# Patient Record
Sex: Male | Born: 1935
Health system: Southern US, Community
[De-identification: ages and names within clinical notes are randomized; demographics above are authoritative.]

## PROBLEM LIST (undated history)

## (undated) DIAGNOSIS — G709 Myoneural disorder, unspecified: Secondary | ICD-10-CM

## (undated) DIAGNOSIS — K449 Diaphragmatic hernia without obstruction or gangrene: Secondary | ICD-10-CM

## (undated) DIAGNOSIS — H3530 Unspecified macular degeneration: Secondary | ICD-10-CM

## (undated) DIAGNOSIS — D509 Iron deficiency anemia, unspecified: Secondary | ICD-10-CM

## (undated) DIAGNOSIS — D2362 Other benign neoplasm of skin of left upper limb, including shoulder: Secondary | ICD-10-CM

## (undated) DIAGNOSIS — M5030 Other cervical disc degeneration, unspecified cervical region: Secondary | ICD-10-CM

## (undated) DIAGNOSIS — T7840XA Allergy, unspecified, initial encounter: Secondary | ICD-10-CM

## (undated) DIAGNOSIS — H269 Unspecified cataract: Secondary | ICD-10-CM

## (undated) DIAGNOSIS — K219 Gastro-esophageal reflux disease without esophagitis: Secondary | ICD-10-CM

## (undated) DIAGNOSIS — C801 Malignant (primary) neoplasm, unspecified: Secondary | ICD-10-CM

## (undated) DIAGNOSIS — J189 Pneumonia, unspecified organism: Secondary | ICD-10-CM

## (undated) DIAGNOSIS — M4800 Spinal stenosis, site unspecified: Secondary | ICD-10-CM

## (undated) DIAGNOSIS — M109 Gout, unspecified: Secondary | ICD-10-CM

## (undated) DIAGNOSIS — A0472 Enterocolitis due to Clostridium difficile, not specified as recurrent: Secondary | ICD-10-CM

## (undated) DIAGNOSIS — I10 Essential (primary) hypertension: Secondary | ICD-10-CM

## (undated) DIAGNOSIS — J45909 Unspecified asthma, uncomplicated: Secondary | ICD-10-CM

## (undated) DIAGNOSIS — R972 Elevated prostate specific antigen [PSA]: Secondary | ICD-10-CM

## (undated) HISTORY — PX: BACK SURGERY: SHX140

## (undated) HISTORY — PX: SKIN CANCER EXCISION: SHX779

## (undated) HISTORY — DX: Diaphragmatic hernia without obstruction or gangrene: K44.9

## (undated) HISTORY — PX: POLYPECTOMY: SHX149

## (undated) HISTORY — DX: Unspecified macular degeneration: H35.30

## (undated) HISTORY — DX: Gout, unspecified: M10.9

## (undated) HISTORY — DX: Unspecified cataract: H26.9

## (undated) HISTORY — DX: Other cervical disc degeneration, unspecified cervical region: M50.30

## (undated) HISTORY — DX: Essential (primary) hypertension: I10

## (undated) HISTORY — DX: Gastro-esophageal reflux disease without esophagitis: K21.9

## (undated) HISTORY — DX: Spinal stenosis, site unspecified: M48.00

## (undated) HISTORY — DX: Iron deficiency anemia, unspecified: D50.9

## (undated) HISTORY — DX: Enterocolitis due to Clostridium difficile, not specified as recurrent: A04.72

## (undated) HISTORY — PX: COLONOSCOPY W/ POLYPECTOMY: SHX1380

## (undated) HISTORY — PX: MOUTH SURGERY: SHX715

## (undated) HISTORY — DX: Allergy, unspecified, initial encounter: T78.40XA

## (undated) HISTORY — DX: Myoneural disorder, unspecified: G70.9

## (undated) HISTORY — DX: Elevated prostate specific antigen (PSA): R97.20

## (undated) HISTORY — DX: Other benign neoplasm of skin of left upper limb, including shoulder: D23.62

## (undated) HISTORY — PX: OTHER SURGICAL HISTORY: SHX169

## (undated) HISTORY — PX: COLONOSCOPY: SHX174

## (undated) HISTORY — DX: Unspecified asthma, uncomplicated: J45.909

---

## 2001-08-08 ENCOUNTER — Emergency Department (HOSPITAL_COMMUNITY): Admission: EM | Admit: 2001-08-08 | Discharge: 2001-08-08 | Payer: Self-pay

## 2002-07-29 ENCOUNTER — Ambulatory Visit (HOSPITAL_COMMUNITY): Admission: RE | Admit: 2002-07-29 | Discharge: 2002-07-29 | Payer: Self-pay | Admitting: Orthopedic Surgery

## 2002-07-29 ENCOUNTER — Encounter: Payer: Self-pay | Admitting: Orthopedic Surgery

## 2004-08-15 ENCOUNTER — Ambulatory Visit: Payer: Self-pay | Admitting: Family Medicine

## 2004-10-24 ENCOUNTER — Ambulatory Visit: Payer: Self-pay | Admitting: Internal Medicine

## 2004-10-24 ENCOUNTER — Ambulatory Visit: Payer: Self-pay | Admitting: Family Medicine

## 2005-10-01 ENCOUNTER — Ambulatory Visit: Payer: Self-pay | Admitting: Family Medicine

## 2006-08-06 ENCOUNTER — Ambulatory Visit: Payer: Self-pay | Admitting: Family Medicine

## 2006-08-13 DIAGNOSIS — F17200 Nicotine dependence, unspecified, uncomplicated: Secondary | ICD-10-CM | POA: Insufficient documentation

## 2006-08-13 DIAGNOSIS — I10 Essential (primary) hypertension: Secondary | ICD-10-CM | POA: Insufficient documentation

## 2006-08-13 DIAGNOSIS — M1990 Unspecified osteoarthritis, unspecified site: Secondary | ICD-10-CM | POA: Insufficient documentation

## 2006-08-13 DIAGNOSIS — D509 Iron deficiency anemia, unspecified: Secondary | ICD-10-CM | POA: Insufficient documentation

## 2006-08-13 DIAGNOSIS — K219 Gastro-esophageal reflux disease without esophagitis: Secondary | ICD-10-CM | POA: Insufficient documentation

## 2006-08-13 DIAGNOSIS — E669 Obesity, unspecified: Secondary | ICD-10-CM | POA: Insufficient documentation

## 2006-08-13 DIAGNOSIS — J309 Allergic rhinitis, unspecified: Secondary | ICD-10-CM | POA: Insufficient documentation

## 2006-08-13 DIAGNOSIS — K449 Diaphragmatic hernia without obstruction or gangrene: Secondary | ICD-10-CM | POA: Insufficient documentation

## 2007-01-16 ENCOUNTER — Ambulatory Visit: Payer: Self-pay | Admitting: Internal Medicine

## 2007-07-31 ENCOUNTER — Ambulatory Visit: Payer: Self-pay | Admitting: Family Medicine

## 2007-08-18 DIAGNOSIS — J189 Pneumonia, unspecified organism: Secondary | ICD-10-CM | POA: Insufficient documentation

## 2007-08-20 ENCOUNTER — Ambulatory Visit: Payer: Self-pay | Admitting: Family Medicine

## 2008-01-06 ENCOUNTER — Ambulatory Visit: Payer: Self-pay | Admitting: Family Medicine

## 2008-05-16 ENCOUNTER — Ambulatory Visit: Payer: Self-pay | Admitting: Family Medicine

## 2008-05-16 LAB — CONVERTED CEMR LAB
ALT: 17 units/L (ref 0–53)
AST: 23 units/L (ref 0–37)
Albumin: 3.9 g/dL (ref 3.5–5.2)
BUN: 29 mg/dL — ABNORMAL HIGH (ref 6–23)
Basophils Relative: 2.3 % (ref 0.0–3.0)
Chloride: 109 meq/L (ref 96–112)
Creatinine, Ser: 1.6 mg/dL — ABNORMAL HIGH (ref 0.4–1.5)
Direct LDL: 102.7 mg/dL
Eosinophils Absolute: 0.4 10*3/uL (ref 0.0–0.7)
Eosinophils Relative: 5.6 % — ABNORMAL HIGH (ref 0.0–5.0)
GFR calc non Af Amer: 45 mL/min
HCT: 45.3 % (ref 39.0–52.0)
HDL: 29.5 mg/dL — ABNORMAL LOW (ref >39.0)
MCV: 86.1 fL (ref 78.0–100.0)
Neutrophils Relative %: 69 % (ref 43.0–77.0)
RBC: 5.26 M/uL (ref 4.22–5.81)
Total Protein: 7.3 g/dL (ref 6.0–8.3)
WBC: 7.2 10*3/uL (ref 4.5–10.5)

## 2008-05-24 ENCOUNTER — Ambulatory Visit: Payer: Self-pay | Admitting: Internal Medicine

## 2008-05-24 DIAGNOSIS — M4800 Spinal stenosis, site unspecified: Secondary | ICD-10-CM | POA: Insufficient documentation

## 2008-05-24 DIAGNOSIS — R972 Elevated prostate specific antigen [PSA]: Secondary | ICD-10-CM | POA: Insufficient documentation

## 2008-05-30 ENCOUNTER — Inpatient Hospital Stay (HOSPITAL_COMMUNITY): Admission: RE | Admit: 2008-05-30 | Discharge: 2008-06-01 | Payer: Self-pay | Admitting: Orthopedic Surgery

## 2008-05-31 ENCOUNTER — Ambulatory Visit: Payer: Self-pay | Admitting: Gastroenterology

## 2008-06-30 ENCOUNTER — Ambulatory Visit: Payer: Self-pay | Admitting: Family Medicine

## 2009-01-11 DIAGNOSIS — J45909 Unspecified asthma, uncomplicated: Secondary | ICD-10-CM | POA: Insufficient documentation

## 2009-01-20 ENCOUNTER — Ambulatory Visit: Payer: Self-pay | Admitting: Family Medicine

## 2009-07-14 ENCOUNTER — Ambulatory Visit: Payer: Self-pay | Admitting: Family Medicine

## 2009-07-14 DIAGNOSIS — L408 Other psoriasis: Secondary | ICD-10-CM | POA: Insufficient documentation

## 2009-07-14 DIAGNOSIS — R944 Abnormal results of kidney function studies: Secondary | ICD-10-CM | POA: Insufficient documentation

## 2009-08-16 ENCOUNTER — Ambulatory Visit: Payer: Self-pay | Admitting: Family Medicine

## 2009-08-16 LAB — CONVERTED CEMR LAB
ALT: 20 units/L (ref 0–53)
AST: 24 units/L (ref 0–37)
Alkaline Phosphatase: 21 units/L — ABNORMAL LOW (ref 39–117)
Calcium: 9.8 mg/dL (ref 8.4–10.5)
Eosinophils Relative: 8.2 % — ABNORMAL HIGH (ref 0.0–5.0)
GFR calc non Af Amer: 48.47 mL/min (ref >60)
Glucose, Urine, Semiquant: NEGATIVE
HCT: 45.6 % (ref 39.0–52.0)
Hemoglobin: 15.5 g/dL (ref 13.0–17.0)
LDL Cholesterol: 94 mg/dL (ref 0–99)
Lymphocytes Relative: 26.8 % (ref 12.0–46.0)
Lymphs Abs: 1.6 10*3/uL (ref 0.7–4.0)
Monocytes Relative: 8.7 % (ref 3.0–12.0)
Neutro Abs: 3.5 10*3/uL (ref 1.4–7.7)
Nitrite: NEGATIVE
Platelets: 147 10*3/uL — ABNORMAL LOW (ref 150.0–400.0)
Potassium: 5.1 meq/L (ref 3.5–5.1)
Protein, U semiquant: NEGATIVE
Sodium: 144 meq/L (ref 135–145)
TSH: 2.14 microintl units/mL (ref 0.35–5.50)
Total Bilirubin: 1.5 mg/dL — ABNORMAL HIGH (ref 0.3–1.2)
Urobilinogen, UA: 0.2
VLDL: 34.6 mg/dL (ref 0.0–40.0)
WBC Urine, dipstick: NEGATIVE
WBC: 6.1 10*3/uL (ref 4.5–10.5)

## 2009-08-23 ENCOUNTER — Ambulatory Visit: Payer: Self-pay | Admitting: Family Medicine

## 2010-08-17 ENCOUNTER — Ambulatory Visit: Payer: Self-pay | Admitting: Family Medicine

## 2010-08-17 LAB — CONVERTED CEMR LAB
AST: 25 units/L (ref 0–37)
Albumin: 3.9 g/dL (ref 3.5–5.2)
BUN: 39 mg/dL — ABNORMAL HIGH (ref 6–23)
Basophils Absolute: 0 10*3/uL (ref 0.0–0.1)
Basophils Relative: 0.5 % (ref 0.0–3.0)
Bilirubin Urine: NEGATIVE
Calcium: 8.9 mg/dL (ref 8.4–10.5)
Chloride: 107 meq/L (ref 96–112)
Cholesterol: 172 mg/dL (ref 0–200)
Creatinine, Ser: 1.4 mg/dL (ref 0.4–1.5)
Eosinophils Absolute: 0.5 10*3/uL (ref 0.0–0.7)
Eosinophils Relative: 7.1 % — ABNORMAL HIGH (ref 0.0–5.0)
GFR calc non Af Amer: 51.92 mL/min (ref >60)
HDL: 35.8 mg/dL — ABNORMAL LOW (ref >39.00)
Ketones, urine, test strip: NEGATIVE
LDL Cholesterol: 109 mg/dL — ABNORMAL HIGH (ref 0–99)
Lymphocytes Relative: 28.4 % (ref 12.0–46.0)
MCV: 89.4 fL (ref 78.0–100.0)
Monocytes Absolute: 0.6 10*3/uL (ref 0.1–1.0)
Monocytes Relative: 9.2 % (ref 3.0–12.0)
Neutrophils Relative %: 54.8 % (ref 43.0–77.0)
Nitrite: NEGATIVE
Platelets: 170 10*3/uL (ref 150.0–400.0)
RBC: 4.91 M/uL (ref 4.22–5.81)
TSH: 1.92 microintl units/mL (ref 0.35–5.50)
Total CHOL/HDL Ratio: 5
Triglycerides: 136 mg/dL (ref 0.0–149.0)
Urobilinogen, UA: 0.2
VLDL: 27.2 mg/dL (ref 0.0–40.0)
WBC: 6.4 10*3/uL (ref 4.5–10.5)

## 2010-08-27 ENCOUNTER — Ambulatory Visit: Payer: Self-pay | Admitting: Family Medicine

## 2010-08-27 ENCOUNTER — Encounter: Payer: Self-pay | Admitting: Family Medicine

## 2010-10-30 NOTE — Assessment & Plan Note (Signed)
Summary: cpx/cjr   Vital Signs:  Patient profile:   77 year old male Height:      69 inches Weight:      217 pounds BMI:     32.16 Temp:     98.2 degrees F oral Pulse rate:   64 / minute Pulse rhythm:   irregular BP sitting:   102 / 62  (left arm) Cuff size:   large  Vitals Entered By: Townsend Roger, CMA (August 27, 2010 2:17 PM) CC: cpx   CC:  cpx.  History of Present Illness: Shane [name] is a 77 year old, married male, nonsmoker, who comes in today for Medicare wellness exam.  He has a history of underlying hypertension, for which he takes one half of a Tenoretic daily.  BP 102/62.  Asymptomatic.  No hypotension also Avodart .5 nightly for mild BPH.  He gets routine eye care, dental care, colonoscopy, 2006 normal, tetanus, 2005, Pneumovax 2010, has not had the flu shot.  Recommend he get his flu shot.  Review of systems pertinent as having sleep dysfunction.  Because of pain in his right and left hips.  This is been normal on for about 6 months.  It seems to be getting worse.  Once he gets up and moves around.  It feels better.  He plays golf two to 3 times a week.  No history of trauma.. Here for Medicare AWV:  1.   Risk factors based on Past M, S, F history:negative, except for hip pain as noted aboveq 2.   Physical Activities: continues to be physically active 3.   Depression/mood: good mood.  No depression 4.   Hearing: normal 5.   ADL's: functions independently 6.   Fall Risk: we did not identify 7.   Home Safety: no guns in the house 8.   Height, weight, &visual acuity:height weight, normal.  Vision normal except for slight macular degeneration, followed by his ophthalmologist, Dr. Gershon Crane 9.   Counseling: continue good health habits 10.   Labs ordered based on risk factors: done  today 11.           Referral Coordination.......none indicated 12.           Care Plan.......Marland Kitchenreviewed all medications 13.            Cognitive Assessment oriented x 3 does all his own financial  work independently  Current Medications (verified): 1)  Occuvite 2)  Asa 3)  Glucosamine 1500 Complex   Caps (Glucosamine-Chondroit-Vit C-Mn) .... Take One Tab Two Times A Day 4)  Tenoretic 50 50-25 Mg  Tabs (Atenolol-Chlorthalidone) .... 1/2 Qam 5)  Avodart 0.5 Mg Caps (Dutasteride) .... At Bedtime 6)  Motrin Ib 200 Mg Tabs (Ibuprofen) .... Once Daily As Needed  Allergies (verified): No Known Drug Allergies  Past History:  Past medical, surgical, family and social histories (including risk factors) reviewed, and no changes noted (except as noted below).  Past Medical History: Reviewed history from 08/13/2006 and no changes required. Allergic rhinitis Anemia-iron deficiency GERD Hypertension Degenerative joint disease Hiatal hernia Obesity Tobacco abuse- quit 2001, former 1/2 ppd smoker  Family History: Reviewed history and no changes required.  Social History: Reviewed history from 05/16/2008 and no changes required. Retired Never Smoked Alcohol use-no Drug use-no Regular exercise-yes  Review of Systems      See HPI  Physical Exam  General:  Well-developed,well-nourished,in no acute distress; alert,appropriate and cooperative throughout examination Head:  Normocephalic and atraumatic without obvious abnormalities. No apparent alopecia or balding. Eyes:  No corneal or conjunctival inflammation noted. EOMI. Perrla. Funduscopic exam benign, without hemorrhages, exudates or papilledema. Vision grossly normal. Ears:  External ear exam shows no significant lesions or deformities.  Otoscopic examination reveals clear canals, tympanic membranes are intact bilaterally without bulging, retraction, inflammation or discharge. Hearing is grossly normal bilaterally. Nose:  External nasal examination shows no deformity or inflammation. Nasal mucosa are pink and moist without lesions or exudates. Mouth:  Oral mucosa and oropharynx without lesions or exudates.  Teeth in good  repair. Neck:  No deformities, masses, or tenderness noted. Chest Wall:  No deformities, masses, tenderness or gynecomastia noted. Breasts:  No masses or gynecomastia noted Lungs:  Normal respiratory effort, chest expands symmetrically. Lungs are clear to auscultation, no crackles or wheezes. Heart:  Normal rate and regular rhythm. S1 and S2 normal without gallop, murmur, click, rub or other extra sounds. Abdomen:  Bowel sounds positive,abdomen soft and non-tender without masses, organomegaly or hernias noted. Rectal:  No external abnormalities noted. Normal sphincter tone. No rectal masses or tenderness. Genitalia:  Testes bilaterally descended without nodularity, tenderness or masses. No scrotal masses or lesions. No penis lesions or urethral discharge. Prostate:  no nodules, no asymmetry, and 1+ enlarged.   Msk:  all his joints appeared normal except for right and left hip.  Marked decrease range of motion 30 degrees and external rotation of both hips Pulses:  R and L carotid,radial,femoral,dorsalis pedis and posterior tibial pulses are full and equal bilaterally Extremities:  No clubbing, cyanosis, edema, or deformity noted with normal full range of motion of all joints.   Neurologic:  No cranial nerve deficits noted. Station and gait are normal. Plantar reflexes are down-going bilaterally. DTRs are symmetrical throughout. Sensory, motor and coordinative functions appear intact.   Impression & Recommendations:  Problem # 1:  PROSTATE SPECIFIC ANTIGEN, ELEVATED (ICD-790.93) Assessment Improved  Orders: Prescription Created Electronically [phone]) Medicare -1st Annual Wellness Visit [phone])  Problem # 2:  DEGENERATIVE JOINT DISEASE (ICD-715.90) Assessment: Deteriorated  His updated medication list for this problem includes:    Motrin Ib 200 Mg Tabs (Ibuprofen) ..... Once daily as needed  Orders: Prescription Created Electronically [phone]) Medicare -1st Annual Wellness Visit  [phone])  Problem # 3:  HYPERTENSION (ICD-401.9) Assessment: Improved  His updated medication list for this problem includes:    Tenoretic 50 50-25 Mg Tabs (Atenolol-chlorthalidone) .Marland Kitchen... 1/2 qam  Orders: Prescription Created Electronically [phone]) Medicare -1st Annual Wellness Visit [phone])  Problem # 4:  Preventive Health Care (ICD-V70.0) Assessment: Unchanged  Complete Medication List: 1)  Occuvite  2)  Asa  3)  Glucosamine 1500 Complex Caps (Glucosamine-chondroit-vit c-mn) .... Take one tab two times a day 4)  Tenoretic 50 50-25 Mg Tabs (Atenolol-chlorthalidone) .... 1/2 qam 5)  Avodart 0.5 Mg Caps (Dutasteride) .... At bedtime 6)  Motrin Ib 200 Mg Tabs (Ibuprofen) .... Once daily as needed  Patient Instructions: 1)  takes 600 mg of Motrin twice daily with food, and call Dr. Joni Fears, orthopedist, for consult. 2)  Please schedule a follow-up appointment in 1 year. Prescriptions: AVODART 0.5 MG CAPS (DUTASTERIDE) at bedtime  #100 x 3   Entered and Authorized by:   Dorena Cookey MD   Signed by:   Dorena Cookey MD on 08/27/2010   Method used:   Electronically to        [facility] [id number]* (retail)       [address] [facility]  [place], [place]  [address]       Ph: [phone]       Fax: [phone]   RxID:   [id number] MG  TABS (ATENOLOL-CHLORTHALIDONE) 1/2 qam  #50 x 3   Entered and Authorized by:   Dorena Cookey MD   Signed by:   Dorena Cookey MD on 08/27/2010   Method used:   Electronically to        [facility] [phone]* (retail)       [address]       [place], [place]  [address]       Ph: [phone]       Fax: [phone]   RxID:   [id number]    Orders Added: 1)  Prescription Created Electronically [phone] 2)  Medicare -1st Annual Wellness Visit [id number]

## 2011-01-15 ENCOUNTER — Encounter: Payer: Self-pay | Admitting: Family Medicine

## 2011-01-15 ENCOUNTER — Emergency Department (HOSPITAL_COMMUNITY)
Admission: EM | Admit: 2011-01-15 | Discharge: 2011-01-16 | Disposition: A | Discharge status: Home / Self Care | Payer: Medicare Other | Attending: Emergency Medicine | Admitting: Emergency Medicine

## 2011-01-15 ENCOUNTER — Ambulatory Visit (INDEPENDENT_AMBULATORY_CARE_PROVIDER_SITE_OTHER): Payer: Medicare Other | Admitting: Family Medicine

## 2011-01-15 VITALS — BP 120/80 | Temp 98.7°F | Ht 69.25 in | Wt 202.0 lb

## 2011-01-15 DIAGNOSIS — L03211 Cellulitis of face: Secondary | ICD-10-CM

## 2011-01-15 DIAGNOSIS — R51 Headache: Secondary | ICD-10-CM | POA: Insufficient documentation

## 2011-01-15 DIAGNOSIS — R21 Rash and other nonspecific skin eruption: Secondary | ICD-10-CM | POA: Insufficient documentation

## 2011-01-15 DIAGNOSIS — B029 Zoster without complications: Secondary | ICD-10-CM | POA: Insufficient documentation

## 2011-01-15 DIAGNOSIS — L0201 Cutaneous abscess of face: Secondary | ICD-10-CM

## 2011-01-15 DIAGNOSIS — R209 Unspecified disturbances of skin sensation: Secondary | ICD-10-CM | POA: Insufficient documentation

## 2011-01-15 DIAGNOSIS — Z7982 Long term (current) use of aspirin: Secondary | ICD-10-CM | POA: Insufficient documentation

## 2011-01-15 DIAGNOSIS — Z79899 Other long term (current) drug therapy: Secondary | ICD-10-CM | POA: Insufficient documentation

## 2011-01-15 DIAGNOSIS — I10 Essential (primary) hypertension: Secondary | ICD-10-CM | POA: Insufficient documentation

## 2011-01-15 MED ORDER — SULFAMETHOXAZOLE-TRIMETHOPRIM 800-160 MG PO TABS: ORAL_TABLET | ORAL | Status: AC

## 2011-01-15 MED ORDER — DOXYCYCLINE HYCLATE 100 MG PO TABS: 100.0000 mg | ORAL_TABLET | Freq: Two times a day (BID) | ORAL | Status: AC

## 2011-01-15 MED ORDER — ACYCLOVIR 400 MG PO TABS: 400.0000 mg | ORAL_TABLET | Freq: Every day | ORAL | Status: AC

## 2011-01-15 NOTE — Progress Notes (Signed)
  Subjective:    Patient ID: KAPONO BICKNELL, male    DOB: Mar 22, 1932, 77 y.o.   MRN: [id number]  HPILane is a 77 year old male, who comes in today for evaluation of a skin rash.  He states he noticed last Friday.  Some pain around his chin and a sensation of numbness.  The next day he broke out in a rash.  Yesterday, some of the lesions started draining pus.    Review of Systems    General and dermatologic review of systems otherwise negative.  He's never had a shingles vaccine Objective:   Physical Exam Well-developed well-nourished, male in no acute distress.  Examination of the face shows small vesicles ulcerated, consistent with shingles.  One area has an eschar consistent with a superficial staph infection       Assessment & Plan:  Shingles with secondary cellulitis.  Plan antiviral medicine 800 t.i.d., doxycycline, and Septra, one of each twice daily, warm soaks q.i.d. Return in two days.  Advised if rash extends up into the face, around the eyes.  He is to come to the hospital for emergent admission

## 2011-01-15 NOTE — Patient Instructions (Signed)
Begin acyclovir 1 tablet 5 times daily.  Begin doxycycline and Septra one of each twice daily.  Warm soaks 15 minutes 4 times daily.  Wash the area and apply antibiotic ointment 4 times daily.  Return on Thursday for follow-up.  If the rash begins to extend up in your face or around her eyes.  Go  immediately to the emergency room

## 2011-01-17 ENCOUNTER — Ambulatory Visit (INDEPENDENT_AMBULATORY_CARE_PROVIDER_SITE_OTHER): Payer: Medicare Other | Admitting: Family Medicine

## 2011-01-17 ENCOUNTER — Encounter: Payer: Self-pay | Admitting: Family Medicine

## 2011-01-17 VITALS — BP 120/80 | Temp 97.9°F | Wt 215.0 lb

## 2011-01-17 DIAGNOSIS — B029 Zoster without complications: Secondary | ICD-10-CM

## 2011-01-17 MED ORDER — OXYCODONE-ACETAMINOPHEN 5-325 MG PO TABS: 1.0000 | ORAL_TABLET | ORAL | Status: DC | PRN

## 2011-01-17 NOTE — Progress Notes (Signed)
  Subjective:    Patient ID: Shane [name], male    DOB: 17-Nov-1931, 77 y.o.   MRN: [id number]  HPI Turk is a 33 r comes in today for reevaluation of zoster.  We saw him last week he developed zoster around his mouth and lips.  Also, it he had some lesions that were secondarily infected and draining pus.  We therefore, start him on Vibramycin, and Septra, one of each twice daily.  Later on that night.  He went to the emergency room because he has some numbness in the right side of his face and was concerned that the rash might be going into his eye.  In the emergency room he was evaluated.  They did not feel like the rash was going into his eye.  They estimate continue the acyclovir and gave him some oxycodone for pain.  He comes in today feeling better.  His pain now as a 3 on a scale of one to 10   Review of Systems    General and dermatologic review of systems otherwise negative Objective:   Physical Exam    Well-developed well-nourished, male, no acute distress.  Examination of the oral cavity normal.  Examination of the skin around the mouth has multiple shingles-type lesions    Assessment & Plan:  Shingles with secondary cellulitis, improving.  Continue medication.  Return p.r.n.

## 2011-01-17 NOTE — Patient Instructions (Signed)
Continued in pediatrics twice a day.  Motrin 600 mg twice daily with food.  Percocet one half to one tablet every 4 to 6 hours for severe pain.  Return p.r.n.

## 2011-01-22 ENCOUNTER — Telehealth: Payer: Self-pay | Admitting: *Deleted

## 2011-01-22 NOTE — Telephone Encounter (Signed)
Stop the antibiotics

## 2011-01-22 NOTE — Telephone Encounter (Signed)
Pt is having vomiting from Doxycycline, and wants advice.

## 2011-01-22 NOTE — Telephone Encounter (Signed)
patient  Is aware 

## 2011-01-22 NOTE — Telephone Encounter (Signed)
Rachel please call 

## 2011-01-22 NOTE — Telephone Encounter (Signed)
Patient is vomiting about 40 minutes after taking his doxycyline.  Could he try something different?

## 2011-02-12 NOTE — Op Note (Signed)
[name], Shane                ACCOUNT NO.:  [id number]   MEDICAL RECORD NO.:  [id number]          PATIENT TYPE:  INP   LOCATION:  0011                         FACILITY[facility]  [facility]   PHYSICIAN:  Tarri Glenn, M.D.  DATE OF BIRTH:  31-May-1932   DATE OF PROCEDURE:  05/30/2008  DATE OF DISCHARGE:                               OPERATIVE REPORT   PREOPERATIVE DIAGNOSIS:  Central and foraminal stenosis L2-3, L3-4, L4-  5, and L5-S1.   POSTOPERATIVE DIAGNOSIS:  Central and foraminal stenosis L2-3, L3-4, L4-  5, and L5-S1.   OPERATION:  Central and foraminal decompression, L2 to the sacrum.   SURGEON:  Tarri Glenn, M.D.   ASSISTANT:  Kipp Brood. Gladstone Lighter, M.D.   ANESTHESIA:  General.   PATHOLOGY AND JUSTIFICATION FOR PROCEDURE:  He was having bilateral leg  pain with an MRI demonstrating spinal stenosis, particularly at L4-5,  less severe at L5-S1.  On the axial projections, he had by __________  fairly significant stenosis at L2-3 and at L3-4 as well.  Consequently,  the above surgery was proposed and performed.   PROCEDURE:  Prophylactic antibiotics, satisfactory general anesthesia,  Foley catheter inserted, placed in prone position on the Brach frame.  The back was prepped with DuraPrep and draped in a sterile field, time-  out performed, Ioban utilized.  A midline incision, with the spinous  processes in the middle of the field, clamped with Kocher clamps and  lateral x-ray taken.  We had the radiologist assist [place] in the reading  because of a transitional vertebra, which was designated both by the  radiologist and by MRI as being S1.  Based on this and using a total of  three x-rays to identify precisely the cephalad and caudad borders of  our dissection, I dissected soft tissue off the lamina from L2 to the  sacrum, placed two self-retaining McCullough retractors, with double-  action rongeur removed the spinous processes with most of L2 down to the  sacrum, as well as a  generous portion of the neural arches.  Then  working with 2 and 3-mm Kerrison rongeurs and occasionally with the  double-action rongeur, we removed considerable bone, performed a partial  decompression, then brought in the microscope and completed the  decompression, making sure that all cephalad and caudad was well  decompressed, as well as the foramina were patent to hockey-stick.   Despite maintaining a blood pressure in the 100 to 120 range, he had a  good bit of bone bleeding, controlled as much as possible with bone wax,  as well as some soft tissue bleeding which we controlled.  Total  estimated blood loss was 750 mL, however.  There was no dural tear.  At  the conclusion of the case, the wound was irrigated with sterile saline  and Gelfoam soaked in thrombin was placed over the dura and then  following this with a 1/4-inch Penrose drain through the left lower  lumbar area.  Then I closed the wound under direct visualization around  the  drain with interrupted #1 Vicryl in the paralumbar muscle and fascia, 2-  0 Vicryl in subcutaneous tissue, staples in the skin.  Betadine and  Adaptic dry sterile dressing were applied.  He was gently placed on his  back and taken to the recovery room in satisfactory condition, with no  known complications, no blood replacement.           ______________________________  Tarri Glenn, M.D.     JA/MEDQ  D:  05/30/2008  T:  05/31/2008  Job:  [id number]

## 2011-02-12 NOTE — H&P (Signed)
NAME:  YANIV, EPPERT                ACCOUNT NO.:  [id number]   MEDICAL RECORD NO.:  [id number]         PATIENT TYPE:  INP   LOCATION:                               FACILITY:  [facility]   PHYSICIAN:  Tarri Glenn, M.D.  DATE OF BIRTH:  [birth year]   DATE OF ADMISSION:  05/30/2008  DATE OF DISCHARGE:                              HISTORY & PHYSICAL   Mr. Turnley is scheduled for admission to [facility] May 30, 2008.   CHIEF COMPLAINT:  Pain in my legs, more so on the right than the left,  also back pain.   PRESENT ILLNESS:  A 77 year old white male who has been seen by [place] with  continued progressive problems concerning pain into his low back.  He  has pain primarily when standing, relieved with sitting and lying down.  If he has to stand for long periods of time, he has marked increasing  pain and discomfort in his lower extremities, particularly in the hip  area, deep inside the thigh.  The patient has gone through an MRI which  has shown moderate to moderately severe congenital and acquired canal  stenosis.  This is seen specifically at L4-L5.  He also has other  stenotic changes with facet changes as well.  This is from L2 to the  sacrum.  Dr. Shellia Carwin discussed with him in detail including risks and  benefits of surgery as well as the type of surgery that he planned to do  and the patient decided to go ahead with a central and foraminal  decompression.   PAST MEDICAL HISTORY:  This gentleman has been in relatively good health  throughout his lifetime.  He has been cleared totally by Dr.  Burnice Logan, partner with Dr. Sherren Mocha (his usual physician) for this  surgical procedure.  The patient is being treated for:  1. Hypertension.  2. Macular degeneration.  3. History of pneumonia two years ago.  4. Hiatal hernia as well.  5. Shoulder arthroscopy in the past.   The patient has no medical allergies.  No food allergies.   CURRENT MEDICATIONS:  1. Ocuvite.  2. Motrin.  3. Glucosamine.  4. Atenolol with chlorthalid.  5. Hydrocodone p.r.n. pain.   FAMILY HISTORY:  Positive for father dying as well as mother in the  past.  She did not state the cause.   SOCIAL HISTORY:  The patient is retired and has had a 50-year history of  smoking and now stopped.  No alcohol intake.  His wife will be the major  caregiver at home.   REVIEW OF SYSTEMS:  CNS: No seizures or paralysis, numbness, double  vision.  RESPIRATORY:  No productive cough, no hemoptysis or shortness  of breath.  CARDIOVASCULAR: No chest pain or angina or orthopnea.  GASTROINTESTINAL: The patient has a hiatal hernia, but does well with  that.  No nausea, vomiting, melena or bloody stool.  GENITOURINARY:  The  patient has nocturia since starting his new anti-hypertension  medication, weak stream as well.  No dysuria, hematuria.  MUSCULOSKELETAL: Primarily in present illness.   PHYSICAL EXAMINATION:  Alert, cooperative, 5 feet 11 inches and 215  pounds, 76 year old white male who is accompanied by his wife.  VITAL SIGNS: Are blood pressure new 140/76 seated right arm, pulse 68  regular, respirations 12 unlabored.  HEENT: Normocephalic.  Wears glasses.  PERRLA. Extra-ocular muscles  intact.  Oropharynx is clear.  CHEST:  Clear to auscultation, rhonchi or rales.  No wheezes.  HEART: Regular rate and rhythm.  No murmurs are heard.  ABDOMEN:  Obese, soft, nontender.  Liver and spleen not felt.  GENITALIA:  Rectal not done, not pertinent to present illness.  EXTREMITIES:  Negative straight leg raising bilaterally with sensory and  motor grossly intact to lower extremities.   ADMISSION DIAGNOSIS:  1. Spinal stenosis L2-S1.  2. Hypertension.  3. Macular degeneration.  4. Hiatal hernia.   PLAN:  The patient undergo a central and foraminal decompressive lumbar  laminectomy from L2-S1.  If we have any problems concerning the  patient's medical situation at the hospital, will certainly ask that Dr.   Dorena Cookey or his partner, Dr. Bluford Kaufmann to cover with [place]  during his hospitalization or we will least seek their advice.      Dooley L. Vanita Ingles.    ______________________________  Tarri Glenn, M.D.    DLU/MEDQ  D:  05/24/2008  T:  05/24/2008  Job:  [id number]   cc:   Dellis Filbert A. Sherren Mocha, MD  [place]  [place] [address]   Peter F. Kwiatkowski, MD  [place]  [place] [address]

## 2011-02-12 NOTE — Discharge Summary (Signed)
Shane [name], Shane [name]                ACCOUNT NO.:  [id number]   MEDICAL RECORD NO.:  [id number]          PATIENT TYPE:  INP   LOCATION:  Belt                         FACILITY:  [facility]   PHYSICIAN:  Tarri Glenn, M.D.  DATE OF BIRTH:  August 02, 1932   DATE OF ADMISSION:  05/30/2008  DATE OF DISCHARGE:  06/01/2008                               DISCHARGE SUMMARY   ADMITTING DIAGNOSES:  1. Spinal stenosis L2-S1.  2. Hypertension.  3. Macular degeneration.   DISCHARGE DIAGNOSES:  1. Central and foraminal stenosis L2-L3, L3-L4, L4-L5 and L5-S1.  2. Hypertension.  3. Macular degeneration.  4. 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   OPERATION:  On May 30, 2008 the patient underwent central and  foraminal decompression L2 to the sacrum with implant of Penrose drain.   BRIEF HISTORY:  This 77 year old white male who had progressive problems  concerning pain into his lumbar spine, but with more radiation into the  lower extremity.  He had had this going on for some time to the point  now where he is having difficulty getting about, and having a  considerable amount of pain and discomfort in his lower extremities.  He  was seen preoperatively by [facility] at [facility], specifically Dr.  Burnice Logan for Dr. Sherren Mocha.  Once the patient had been cleared for  surgery, it was decided he could indeed tolerate the surgical procedure  and MRI had shown the stenotic changes as mentioned above, so it was  decided to go ahead with surgery.   COURSE IN THE HOSPITAL:  The patient tolerated the surgical procedure  quite well.  As usual, he was put on analgesics postoperatively.  It was  noted that on postop day his abdomen was really quite distended, very  uncomfortable.  He was  having eructations and nausea.  His abdomen was  ripe and resonant to percussion, and very quiet as far as bowel sounds  were concerned.  It was felt that he was developing an early  postoperative ileus.  We asked for GI  consult.  Dr. Deatra Ina and Nicoletta Ba saw the patient and indeed, the MRI did not show full ileus  nor blockage, but due to the opiate administration postoperatively, he  was pre-ileus.  Appropriate medications were given including MiraLax.  He was eventually able to pass flatus and to have a very small bowel  movement the day of discharge.   Foley was discontinued on the first postop day, had some difficulty with  that, but we gave him Flomax, which allowed him to void postoperatively.   As far as his wound is concerned, the Penrose drain was advanced  partially on the first postop day and then discontinued on the second  postop day.  He has a moderate amount of drainage from the puncture  wound from the Penrose drain,  which was expected, but the surgical  wound itself was clean and dry.   Physical therapy saw the patient to assist him in ambulation and a  walker was fitted to allow him to come to a standing position from a  seated position.  It was also for security and comfort.  He tolerated  this quite well and was ambulating in the hall with minimal difficulty.   Neurovascularly, he remained intact in the lower extremities and he was  very pleased with reduction of pain into his lower extremities.   On the day of discharge, I discussed with his wife as well as his son  the plans for home care.  He is to relax and only move it he desires,  but we do encourage movement of some kind.  Dry dressing to the back on  as needed basis.  When all drainage has stopped, he may shower and use a  light dressing to the back.  We will see him back in the office about 2  weeks after date of surgery.  He will follow up with Dr. Saddie Benders al as  needed.  He is to slowly increase his diet.  This too was discussed with  his wife.  Begin with soups, puddings, etc., advancing to regular food.  Should he have any problems concerning his GI tract with lack of bowel  movement, etc. then contact Dr.  Deatra Ina.   DISCHARGE MEDICATIONS:  He is to continue with the medications under Dr.  Sherren Mocha which include:  1. Atenolol/chlorothiazide 50/25 one-half tablet daily.  2. Pressure vision vitamin.  3. Cos amine DS.  4. Motrin as needed.  We wrote him:  1. Phenergan for any nausea he may have at home.  2. Percocet for pain.  3. Robaxin 500 mg.  4. Ms. Trellis Paganini, PA-C wants him to take MiraLax 17 grams in 8 ounces      of  water daily as long as he is on the pain medications.   Today at discharge, I spent a considerable amount of time with the  patient and his wife answering all questions.  They are encouraged to  call the office should he have any problems concerning his back or any  questions, and to follow with Dr. Sherren Mocha per his instructions  postoperatively.      Dooley L. Vanita Ingles.    ______________________________  Tarri Glenn, M.D.    DLU/MEDQ  D:  06/01/2008  T:  06/01/2008  Job:  [id number]   cc:   Sandy Salaam. Deatra Ina, MD,FACG  [address]  [place] [address]   Jeffrey A. Sherren Mocha, MD  [address]  [place]  [place] [address]   Tarri Glenn, M.D.  Fax: [phone]

## 2011-02-15 ENCOUNTER — Telehealth: Payer: Self-pay | Admitting: *Deleted

## 2011-02-15 NOTE — Discharge Summary (Signed)
NAMEBURR, CLEMENTZ                ACCOUNT NO.:  [id number]   MEDICAL RECORD NO.:  [id number]          PATIENT TYPE:  INP   LOCATION:  [place]                         FACILITY:  [facility]   PHYSICIAN:  Tarri Glenn, M.D.  DATE OF BIRTH:  04/03/1932   DATE OF ADMISSION:  05/30/2008  DATE OF DISCHARGE:  06/01/2008                               DISCHARGE SUMMARY   ADDENDUM:  The discharge summary was dictated on this gentleman  June 01, 2008.  Unfortunately, the laboratory values were omitted  from that dictation.   LABORATORY VALUES DURING THAT ADMISSION ARE AS FOLLOWS:  Hemoglobin on  May 31, 2008 was 11.9.  Routine blood chemistries were within  normal limits other than a slightly elevated glucose at 132 with BUN at  35.  The eGFR was 48.  Electrocardiogram showed sinus rhythm with first-  degree AV block.  The chest x-ray of May 25, 2008 showed no active  chest disease.  Abdominal views showed no evidence of bowel obstruction  or free intraperitoneal air.  This of course was limited by supine  patient positioning read by Charlann Boxer. Jobe Igo, M.D.  The abdominal x-ray  was done for nausea, vomiting and distention and that was needed for the  GI consult.   The course in the hospital and the discharge plans were exactly as  dictated on the above date.      Dooley L. Vanita Ingles.    ______________________________  Tarri Glenn, M.D.    DLU/MEDQ  D:  08/24/2008  T:  08/24/2008  Job:  [id number]

## 2011-02-15 NOTE — Telephone Encounter (Signed)
Pt is having pain at place his shingle rash was.  No more rash, but pain is still there.

## 2011-02-15 NOTE — Discharge Summary (Signed)
Shane [name], Shane [name]                ACCOUNT NO.:  [id number]   MEDICAL RECORD NO.:  [id number]          PATIENT TYPE:  INP   LOCATION:  [place]                         FACILITY:  [facility]   PHYSICIAN:  Tarri Glenn, M.D.  DATE OF BIRTH:  22-Nov-1931   DATE OF ADMISSION:  05/30/2008  DATE OF DISCHARGE:  06/01/2008                               DISCHARGE SUMMARY   ADDENDUM:  This is the second addendum to this patient.   CONDITION ON DISCHARGE:  Improved and stable.      Dooley L. Vanita Ingles.    ______________________________  Tarri Glenn, M.D.    DLU/MEDQ  D:  08/24/2008  T:  08/24/2008  Job:  [id number]

## 2011-02-18 ENCOUNTER — Telehealth: Payer: Self-pay | Admitting: *Deleted

## 2011-02-18 NOTE — Telephone Encounter (Signed)
Generic Neurontin 100 mg  #30 one twice a day Tramadol 50 mg #50 one every 6 hours as needed for pain  Please schedule appointment with  Dr. Sherren Mocha next week for followup

## 2011-02-18 NOTE — Telephone Encounter (Signed)
patient  Is calling because he has had shingles.  The shingles are gone but he still has some pain. He would like to know if he should come in for an office visit or have something called in?

## 2011-02-19 MED ORDER — TRAMADOL HCL 50 MG PO TABS: 50.0000 mg | ORAL_TABLET | Freq: Four times a day (QID) | ORAL | Status: AC | PRN

## 2011-02-19 MED ORDER — GABAPENTIN 100 MG PO TABS: 100.0000 mg | ORAL_TABLET | Freq: Two times a day (BID) | ORAL | Status: DC

## 2011-02-19 NOTE — Telephone Encounter (Signed)
Spoke with wife and rx sent

## 2011-02-20 NOTE — Telephone Encounter (Signed)
Left message on machine for patient

## 2011-02-20 NOTE — Telephone Encounter (Signed)
This is a post-shingles neuralgia,,,,,,,,,, office visit Thursday for evaluation

## 2011-02-26 ENCOUNTER — Encounter: Payer: Self-pay | Admitting: Family Medicine

## 2011-02-26 ENCOUNTER — Ambulatory Visit (INDEPENDENT_AMBULATORY_CARE_PROVIDER_SITE_OTHER): Payer: Medicare Other | Admitting: Family Medicine

## 2011-02-26 VITALS — BP 110/78 | Temp 98.1°F | Wt 213.0 lb

## 2011-02-26 DIAGNOSIS — B0229 Other postherpetic nervous system involvement: Secondary | ICD-10-CM | POA: Insufficient documentation

## 2011-02-26 MED ORDER — GABAPENTIN 300 MG PO CAPS: ORAL_CAPSULE | ORAL | Status: DC

## 2011-02-26 NOTE — Progress Notes (Signed)
  Subjective:    Patient ID: Shane [name], male    DOB: 25-Aug-1932, 77 y.o.   MRN: [id number]  Shane [name] is a 77 year old male, who comes in today for evaluation of post shingles neuralgia.  He had a severe case of shingles on his chin parent.  The rash went away about two weeks ago, however, the pain does not go away.  He describes it as a sensation of numbness, tingling, and pain.  A3 on a scale of one to 10.  He also feels like a portion of his tongue may be numb.  He was started on Neurontin by Dr. K100 milligrams b.i.d., but it hasn't seemed to help very much.  He also has Percocet and Ultram however, he doesn't take it may make him constipated.   Review of Systems    General and neurologic review of systems otherwise negative Objective:   Physical Exam Well-developed well-nourished, male in no acute distress.  Examination the face is normal.  Previous shingles rash is gone.       Assessment & Plan:  Postherpetic neuralgia,,,,,,,,,,, increase the Neurontin to 300 b.i.d.,,,,,,,,,,, stop the acyclovir and,,,,,,,,,,, Percocet or Ultram nightly,,,,,,,,,,,,, return in two weeks for follow-up

## 2011-02-26 NOTE — Patient Instructions (Signed)
Increase the gabapentin 300 mg 3 times daily.  Take a half of a Percocet or a half of a tramadol at bedtime for nighttime pain.  Return in 3 weeks for follow-up

## 2011-03-19 ENCOUNTER — Ambulatory Visit (INDEPENDENT_AMBULATORY_CARE_PROVIDER_SITE_OTHER): Payer: Medicare Other | Admitting: Family Medicine

## 2011-03-19 ENCOUNTER — Encounter: Payer: Self-pay | Admitting: Family Medicine

## 2011-03-19 VITALS — BP 110/80 | Temp 98.0°F | Wt 220.0 lb

## 2011-03-19 DIAGNOSIS — B0229 Other postherpetic nervous system involvement: Secondary | ICD-10-CM

## 2011-03-19 MED ORDER — GABAPENTIN 600 MG PO TABS: ORAL_TABLET | ORAL | Status: DC

## 2011-03-19 NOTE — Progress Notes (Signed)
  Subjective:    Patient ID: Shane [name], male    DOB: [birth year], 77 y.o.   MRN: [id number]  HPI Shane [name] is a delightful, 77 year old, married male, nonsmoker, who comes in today for follow-up of shingles.  He had a severe case of shingles involving his shin.  The rash resolved however, the pain is not resolved.  He's currently taking Neurontin 300 mg q.i.d. His pain very strong 12 or 3.  It seems to be getting better not worse.  No side effects from medicines, although he was intolerant of the pain medication.   Review of Systems    General and neurologic review of systems otherwise negative Objective:   Physical Exam    Well-developed well-nourished, male in no acute distress.  Neurologic exam negative    Assessment & Plan:  Postherpetic neuralgia ,,,,,, increase Neurontin to 600 t.i.d. Follow-up in two weeks

## 2011-03-19 NOTE — Patient Instructions (Signed)
Increase the Neurontin to 600 mg 3 times daily.  Return in two weeks for follow-up

## 2011-04-02 ENCOUNTER — Encounter: Payer: Self-pay | Admitting: Family Medicine

## 2011-04-02 ENCOUNTER — Ambulatory Visit (INDEPENDENT_AMBULATORY_CARE_PROVIDER_SITE_OTHER): Payer: Medicare Other | Admitting: Family Medicine

## 2011-04-02 DIAGNOSIS — B0229 Other postherpetic nervous system involvement: Secondary | ICD-10-CM

## 2011-04-02 NOTE — Progress Notes (Signed)
  Subjective:    Patient ID: Shane [name], male    DOB: Nov 03, 1931, 77 y.o.   MRN: [id number]  HPILane is a 77 year old male, who comes in today for follow-up for post shingles neuralgia.  Is currently on Neurontin 600 mg 4 times daily and he states his pain is markedly diminished.  However, the numbness and tingling persists.  Last week.  He had about a 12 hour period where he was totally asymptomatic.  Thought he was well and then it came back.  Is not taking the pain medication.  No side effects from the Neurontin, except for some weight gain    Review of Systems    General and neurologic review of systems otherwise negative Objective:   Physical Exam Well-developed well-nourished, male in no acute distress.  Neurologic examination of the face is normal except for some decreased sensation of his chin       Assessment & Plan:  Post shingles neuralgia improving.  Continue Neurontin

## 2011-04-02 NOTE — Patient Instructions (Signed)
Continue the Neurontin 600 mg 4 times daily.  Return p.r.n..  When you feel the symptoms may be abating and begin to taper the Neurontin by taking 600 mg 3 times a day for one week then 600 mg twice a day for one week then 600 mg daily for one week and then stop

## 2011-07-03 LAB — BASIC METABOLIC PANEL
BUN: 35 — ABNORMAL HIGH
CO2: 24
Calcium: 8.5
Creatinine, Ser: 1.45
GFR calc Af Amer: 58 — ABNORMAL LOW

## 2011-08-13 ENCOUNTER — Ambulatory Visit (INDEPENDENT_AMBULATORY_CARE_PROVIDER_SITE_OTHER): Payer: Medicare Other | Admitting: Family Medicine

## 2011-08-13 DIAGNOSIS — Z23 Encounter for immunization: Secondary | ICD-10-CM

## 2011-09-12 ENCOUNTER — Encounter: Payer: Self-pay | Admitting: Family Medicine

## 2011-09-12 ENCOUNTER — Ambulatory Visit (INDEPENDENT_AMBULATORY_CARE_PROVIDER_SITE_OTHER): Payer: Medicare Other | Admitting: Family Medicine

## 2011-09-12 DIAGNOSIS — J111 Influenza due to unidentified influenza virus with other respiratory manifestations: Secondary | ICD-10-CM | POA: Insufficient documentation

## 2011-09-12 MED ORDER — HYDROCODONE-HOMATROPINE 5-1.5 MG/5ML PO SYRP: ORAL_SOLUTION | ORAL | Status: DC

## 2011-09-12 NOTE — Progress Notes (Signed)
  Subjective:    Patient ID: Shane [name], male    DOB: March 08, 1932, 77 y.o.   MRN: [id number]  Shane [name] is a 77 year old male, nonsmoker......... He quit in 1986........ Comes in today with fever, chills, sore throat, head congestion, nonproductive cough for 3 days.  His wife has similar symptoms that started the same time   Review of Systems General and pulmonary views systems otherwise negative    Objective:   Physical Exam  Well-developed well-nourished man in acute distress.  Examination of the HEENT negative.  The neck was supple.  The thyroid is not enlarged.  Lungs were clear to auscultation      Assessment & Plan:  Influenza plan treat symptomatically.  Return p.r.n.

## 2011-09-12 NOTE — Patient Instructions (Signed)
Rest at home.  Aspirin 4 times daily for fever, chills, and muscle aches.  Hydromet one half to 1 teaspoon 3 to 4 times daily as needed for sore throat, cough, and  Return p.r.n.

## 2011-09-29 ENCOUNTER — Other Ambulatory Visit: Payer: Self-pay | Admitting: Family Medicine

## 2011-11-04 ENCOUNTER — Telehealth: Payer: Self-pay | Admitting: Family Medicine

## 2011-11-04 MED ORDER — ATENOLOL-CHLORTHALIDONE 50-25 MG PO TABS: ORAL_TABLET | ORAL | Status: DC

## 2011-11-04 NOTE — Telephone Encounter (Signed)
Pharmacist called and said that Dr Sherren Mocha is not updated with [facility] to do Escribe. Pls send refills for atenolol-chlorthalidone (TENORETIC) 50-25 MG per tablet through FAX only. Pharmacist said that they can not send escribe req to pcp but can rcv them from the pcp.

## 2012-03-31 ENCOUNTER — Other Ambulatory Visit: Payer: Self-pay | Admitting: Family Medicine

## 2012-07-03 ENCOUNTER — Other Ambulatory Visit: Payer: Self-pay | Admitting: Family Medicine

## 2012-09-16 ENCOUNTER — Ambulatory Visit (INDEPENDENT_AMBULATORY_CARE_PROVIDER_SITE_OTHER): Payer: Medicare Other

## 2012-09-16 DIAGNOSIS — Z23 Encounter for immunization: Secondary | ICD-10-CM

## 2012-09-24 ENCOUNTER — Encounter: Payer: Medicare Other | Admitting: Family Medicine

## 2012-10-02 ENCOUNTER — Encounter: Payer: Medicare Other | Admitting: Family Medicine

## 2012-10-05 ENCOUNTER — Other Ambulatory Visit: Payer: Self-pay | Admitting: Family Medicine

## 2012-10-15 ENCOUNTER — Encounter: Payer: Self-pay | Admitting: Family Medicine

## 2012-10-15 ENCOUNTER — Ambulatory Visit (INDEPENDENT_AMBULATORY_CARE_PROVIDER_SITE_OTHER): Payer: Medicare Other | Admitting: Family Medicine

## 2012-10-15 VITALS — BP 120/78 | Temp 97.9°F | Ht 70.0 in | Wt 209.0 lb

## 2012-10-15 DIAGNOSIS — I10 Essential (primary) hypertension: Secondary | ICD-10-CM

## 2012-10-15 DIAGNOSIS — Z0000 Encounter for general adult medical examination without abnormal findings: Secondary | ICD-10-CM

## 2012-10-15 DIAGNOSIS — M4800 Spinal stenosis, site unspecified: Secondary | ICD-10-CM

## 2012-10-15 DIAGNOSIS — M1990 Unspecified osteoarthritis, unspecified site: Secondary | ICD-10-CM

## 2012-10-15 DIAGNOSIS — E669 Obesity, unspecified: Secondary | ICD-10-CM

## 2012-10-15 DIAGNOSIS — J309 Allergic rhinitis, unspecified: Secondary | ICD-10-CM

## 2012-10-15 DIAGNOSIS — M109 Gout, unspecified: Secondary | ICD-10-CM

## 2012-10-15 DIAGNOSIS — R972 Elevated prostate specific antigen [PSA]: Secondary | ICD-10-CM

## 2012-10-15 LAB — CBC WITH DIFFERENTIAL/PLATELET
Basophils Absolute: 0 10*3/uL (ref 0.0–0.1)
Basophils Relative: 0.3 % (ref 0.0–3.0)
Eosinophils Absolute: 0.5 10*3/uL (ref 0.0–0.7)
HCT: 44.1 % (ref 39.0–52.0)
Hemoglobin: 14.8 g/dL (ref 13.0–17.0)
Lymphs Abs: 1.7 10*3/uL (ref 0.7–4.0)
MCHC: 33.4 g/dL (ref 30.0–36.0)
MCV: 87.4 fl (ref 78.0–100.0)
Monocytes Absolute: 0.7 10*3/uL (ref 0.1–1.0)
Neutro Abs: 5.2 10*3/uL (ref 1.4–7.7)
RDW: 12.6 % (ref 11.5–14.6)

## 2012-10-15 LAB — BASIC METABOLIC PANEL
BUN: 33 mg/dL — ABNORMAL HIGH (ref 6–23)
Chloride: 105 mEq/L (ref 96–112)
GFR: 49.99 mL/min — ABNORMAL LOW (ref >60.00)
Potassium: 5 mEq/L (ref 3.5–5.1)
Sodium: 139 mEq/L (ref 135–145)

## 2012-10-15 LAB — LIPID PANEL
Total CHOL/HDL Ratio: 4
Triglycerides: 125 mg/dL (ref 0.0–149.0)

## 2012-10-15 LAB — POCT URINALYSIS DIPSTICK
Blood, UA: NEGATIVE
Nitrite, UA: NEGATIVE
Spec Grav, UA: 1.025
Urobilinogen, UA: 0.2
pH, UA: 5.5

## 2012-10-15 LAB — HEPATIC FUNCTION PANEL
AST: 25 U/L (ref 0–37)
Albumin: 3.9 g/dL (ref 3.5–5.2)
Alkaline Phosphatase: 29 U/L — ABNORMAL LOW (ref 39–117)
Bilirubin, Direct: 0.1 mg/dL (ref 0.0–0.3)
Total Bilirubin: 1.1 mg/dL (ref 0.3–1.2)

## 2012-10-15 LAB — URIC ACID: Uric Acid, Serum: 8.8 mg/dL — ABNORMAL HIGH (ref 4.0–7.8)

## 2012-10-15 MED ORDER — DUTASTERIDE 0.5 MG PO CAPS: ORAL_CAPSULE | ORAL | Status: DC

## 2012-10-15 MED ORDER — ATENOLOL-CHLORTHALIDONE 50-25 MG PO TABS: ORAL_TABLET | ORAL | Status: DC

## 2012-10-15 NOTE — Patient Instructions (Signed)
Continue current medications  I will call you when I get the report on your lab work  Return in one year for general physical examination sooner if any problems

## 2012-10-15 NOTE — Progress Notes (Signed)
  Subjective:    Patient ID: Shane [name], male    DOB: [birth year], 79 y.o.   MRN: [id number]  [place] is a 79 year old male nonsmoker who comes in today for general Medicare wellness examination  He has a history of hypertension for which he takes Tenoretic 50-25 dose one half tab daily BP 120/78  He takes Avodart 0.5 mg daily for BPH urinating normally  He takes Motrin 400 mg daily for hip back and knee pain when necessary  He also takes an aspirin tablet daily but is bruising a lot on his left arm.  Recently he's had 2 episodes of severe pain redness and swelling of the great toe on his left foot. Symptoms consistent with gout Will check uric acid level  He gets routine eye care, dental care, colonoscopy and GI, vaccinations up-to-date, information given on shingles  Cognitive function normal he walks on a daily basis home health safety reviewed no issues identified, no guns in the house, he does have a health care power of attorney and living will   Review of Systems  Constitutional: Negative.   HENT: Negative.   Eyes: Negative.   Respiratory: Negative.   Cardiovascular: Negative.   Gastrointestinal: Negative.   Genitourinary: Negative.   Musculoskeletal: Negative.   Skin: Negative.   Neurological: Negative.   Hematological: Negative.   Psychiatric/Behavioral: Negative.        Objective:   Physical Exam  Constitutional: He is oriented to person, place, and time. He appears well-developed and well-nourished.  HENT:  Head: Normocephalic and atraumatic.  Right Ear: External ear normal.  Left Ear: External ear normal.  Nose: Nose normal.  Mouth/Throat: Oropharynx is clear and moist.  Eyes: Conjunctivae normal and EOM are normal. Pupils are equal, round, and reactive to light.  Neck: Normal range of motion. Neck supple. No JVD present. No tracheal deviation present. No thyromegaly present.  Cardiovascular: Normal rate, regular rhythm, normal heart sounds and intact  distal pulses.  Exam reveals no gallop and no friction rub.   No murmur heard. Pulmonary/Chest: Effort normal and breath sounds normal. No stridor. No respiratory distress. He has no wheezes. He has no rales. He exhibits no tenderness.  Abdominal: Soft. Bowel sounds are normal. He exhibits no distension and no mass. There is no tenderness. There is no rebound and no guarding.  Genitourinary: Rectum normal and penis normal. Guaiac negative stool. No penile tenderness.       1+ symmetrical nonnodular BPH  Musculoskeletal: Normal range of motion. He exhibits no edema and no tenderness.  Lymphadenopathy:    He has no cervical adenopathy.  Neurological: He is alert and oriented to person, place, and time. He has normal reflexes. No cranial nerve deficit. He exhibits normal muscle tone.  Skin: Skin is warm and dry. No rash noted. No erythema. No pallor.       Total body skin exam shows numerous freckles moles seborrheic keratosis all of which appear benign  Psychiatric: He has a normal mood and affect. His behavior is normal. Judgment and thought content normal.          Assessment & Plan:  Healthy male  Hypertension continue Tenoretic one half tablet daily  History of elevated PSA and BPH continue Avodart 0.5 daily  Degenerative joint disease continue Motrin 400 mg twice a day when necessary  Symptoms consistent with gout check uric acid level  Hearing loss referred to audiology  History of macular degeneration continue followup by Drs. Gershon Crane and Rankin

## 2012-10-16 ENCOUNTER — Encounter: Payer: Self-pay | Admitting: Family Medicine

## 2012-10-16 ENCOUNTER — Telehealth: Payer: Self-pay | Admitting: Family Medicine

## 2012-10-16 MED ORDER — ALLOPURINOL 300 MG PO TABS: 300.0000 mg | ORAL_TABLET | Freq: Every day | ORAL | Status: DC

## 2012-10-16 NOTE — Telephone Encounter (Signed)
Please call patient and inform him that the Rx has been sent.

## 2012-10-16 NOTE — Telephone Encounter (Signed)
Pt aware/kjh

## 2012-10-16 NOTE — Telephone Encounter (Signed)
Pt needs script for shingles vaccine  sent [facility]/ [address] notify pt when this is done so that he may go and get this done. Phone: [phone]

## 2012-10-19 ENCOUNTER — Encounter: Payer: Self-pay | Admitting: Family Medicine

## 2012-12-01 ENCOUNTER — Other Ambulatory Visit: Payer: Self-pay | Admitting: Family Medicine

## 2012-12-22 ENCOUNTER — Telehealth: Payer: Self-pay | Admitting: Family Medicine

## 2012-12-22 NOTE — Telephone Encounter (Signed)
Patient Information:  Caller Name: Sharvil  Phone: [phone]  Patient: Shane [name], Shane [name]  Gender: Male  DOB: [birth year]  Age: 79 Years  PCP: Stevie Kern [facility])  Office Follow Up:  Does the office need to follow up with this patient?: No  Instructions For The Office: N/A  RN Note:  Developed black eye on right side 48 hours after injury.  States no LOC at the time of the injury.  No headache. Denies emergent symptoms per protocol; advised for home care measures, with callback parameters given.  krs/can  Symptoms  Reason For Call & Symptoms: Patient fell and hit his forehead 12/19/12.  Reviewed Health History In EMR: Yes  Reviewed Medications In EMR: Yes  Reviewed Allergies In EMR: Yes  Reviewed Surgeries / Procedures: Yes  Date of Onset of Symptoms: 12/19/2012  Guideline(s) Used:  Head Injury  Disposition Per Guideline:   Home Care  Reason For Disposition Reached:   Minor head injury  Advice Given:  Reassurance - Direct Blow (Contusion, Bruise)  This sounds like a scalp injury rather than a brain injury or concussion. Treatment at home should be safe. A direct blow to your scalp can cause a contusion. Contusion is the medical term for bruise.  Symptoms are mild pain, swelling, and/or bruising. Sometimes there can also be mild dizziness and nausea.  Here is some care advice that should help.  Apply a Cold Pack:  Apply a cold pack or an ice bag (wrapped in a moist towel) to the area for 20 minutes. Repeat in 1 hour, then every 4 hours while awake.  Continue this for the first 48 hours after an injury.  This will help decrease pain and swelling.  Apply Heat to the Area:  Beginning 48 hours after an injury, apply a warm washcloth or heating pad for 10 minutes three times a day.  This will help increase blood flow and improve healing.  Apply Heat to the Area:  Beginning 48 hours after an injury, apply a warm washcloth or heating pad for 10 minutes three times a  day.  This will help increase blood flow and improve healing.  Expected Course:  Most head trauma only causes an injury to the scalp. Pain, swelling, and bruising usually start to get better 2 to 3 days after an injury.  Swelling most often is gone after 1 week.  Bruises fade away slowly over 1-2 weeks.  It may take 2 weeks for pain and tenderness of the injured area to go away.  Diet:   Clear fluids to drink at first, in case of vomiting. May resume a regular diet after 2 hours.  Call Back If:  Severe headache  Extremity weakness or numbness occurs  Slurred speech or blurred vision occurs  Vomiting occurs  You become worse.  Patient Will Follow Care Advice:  YES

## 2012-12-22 NOTE — Telephone Encounter (Signed)
noted 

## 2012-12-22 NOTE — Telephone Encounter (Signed)
FYI

## 2012-12-23 ENCOUNTER — Telehealth: Payer: Self-pay | Admitting: Family Medicine

## 2012-12-23 ENCOUNTER — Ambulatory Visit (INDEPENDENT_AMBULATORY_CARE_PROVIDER_SITE_OTHER)
Admission: RE | Admit: 2012-12-23 | Discharge: 2012-12-23 | Disposition: A | Discharge status: Home / Self Care | Payer: Medicare Other | Source: Ambulatory Visit | Attending: Family | Admitting: Family

## 2012-12-23 ENCOUNTER — Encounter: Payer: Self-pay | Admitting: Family

## 2012-12-23 ENCOUNTER — Ambulatory Visit (INDEPENDENT_AMBULATORY_CARE_PROVIDER_SITE_OTHER): Payer: Medicare Other | Admitting: Family

## 2012-12-23 VITALS — BP 120/78 | HR 67 | Wt 209.0 lb

## 2012-12-23 DIAGNOSIS — S0990XA Unspecified injury of head, initial encounter: Secondary | ICD-10-CM

## 2012-12-23 NOTE — Patient Instructions (Addendum)
Head Injury, Adult  You have had a head injury that does not appear serious at this time. A concussion is a state of changed mental ability, usually from a blow to the head. You should take clear liquids for the rest of the day and then resume your regular diet. You should not take sedatives or alcoholic beverages for as long as directed by your caregiver after discharge. After injuries such as yours, most problems occur within the first 24 hours.  SYMPTOMS  These minor symptoms may be experienced after discharge:   Memory difficulties.   Dizziness.   Headaches.   Double vision.   Hearing difficulties.   Depression.   Tiredness.   Weakness.   Difficulty with concentration.  If you experience any of these problems, you should not be alarmed. A concussion requires a few days for recovery. Many patients with head injuries frequently experience such symptoms. Usually, these problems disappear without medical care. If symptoms last for more than one day, notify your caregiver. See your caregiver sooner if symptoms are becoming worse rather than better.  HOME CARE INSTRUCTIONS    During the next 24 hours you must stay with someone who can watch you for the warning signs listed below.  Although it is unlikely that serious side effects will occur, you should be aware of signs and symptoms which may necessitate your return to this location. Side effects may occur up to 7  10 days following the injury. It is important for you to carefully monitor your condition and contact your caregiver or seek immediate medical attention if there is a change in your condition.  SEEK IMMEDIATE MEDICAL CARE IF:    There is confusion or drowsiness.   You can not awaken the injured person.   There is nausea (feeling sick to your stomach) or continued, forceful vomiting.   You notice dizziness or unsteadiness which is getting worse, or inability to walk.   You have convulsions or unconsciousness.   You experience severe,  persistent headaches not relieved by over-the-counter or prescription medicines for pain. (Do not take aspirin as this impairs clotting abilities). Take other pain medications only as directed.   You can not use arms or legs normally.   There is clear or bloody discharge from the nose or ears.  MAKE SURE YOU:    Understand these instructions.   Will watch your condition.   Will get help right away if you are not doing well or get worse.  Document Released: 09/16/2005 Document Revised: 12/09/2011 Document Reviewed: 08/04/2009  ExitCare Patient Information 2013 [employer].

## 2012-12-23 NOTE — Telephone Encounter (Signed)
Patient Information:  Caller Name: Shane [name]  Phone: [phone]  Patient: Shane [name], Shane [name]  Gender: Male  DOB: Mar 22, 1932  Age: 79 Years  PCP: Stevie Kern [facility])  Office Follow Up:  Does the office need to follow up with this patient?: No  Instructions For The Office: N/A  RN Note:  RN advised that a follow up would be good since pt is still having symptoms from fall > 3 days.  Pt denies any head pain but does reports discomfort.  Pt was agreeable to appt.  Symptoms  Reason For Call & Symptoms: caller reports he fell hitting his head on concrete (hit on forehead).  Pt states he tripped going up steps.  Pt states on Monday (12/21/12) pt developed a black eye on the right side.  Caller states today (12/23/12) his left eye is turning black.  Pt had NO LOC  Reviewed Health History In EMR: Yes  Reviewed Medications In EMR: Yes  Reviewed Allergies In EMR: Yes  Reviewed Surgeries / Procedures: Yes  Date of Onset of Symptoms: 12/19/2012  Guideline(s) Used:  Head Injury  Disposition Per Guideline:   Home Care  Reason For Disposition Reached:   Minor head injury  Advice Given:  N/A  RN Overrode Recommendation:  Make Appointment  RN made appt due to pt age and symptoms that have been persisting > 3 days from injury  Appointment Scheduled:  12/23/2012 11:30:00 Appointment Scheduled Provider:  Roxy Cedar [facility])  (Dr Sherren Mocha is out of the office.)

## 2012-12-23 NOTE — Telephone Encounter (Signed)
Appt scheduled for today. FYI

## 2012-12-23 NOTE — Progress Notes (Signed)
Subjective:    Patient ID: Shane [name], male    DOB: March 22, 1932, 79 y.o.   MRN: [id number]  HPI 79 year old white male, nonsmoker, patient of Dr. Sherren Mocha, is in today after a fall 4 days ago over a curb then subsequently hitting his head on the concrete and developing a hematoma. The hematoma is resolving but now has bruising around the right eye and this morning began to have bruising around the left eye. He denies any LOC, dazed, confused, nausea, vomiting, lightheadedness nor dizziness, no headache.     Review of Systems  Constitutional: Negative.   HENT: Positive for facial swelling. Negative for congestion, rhinorrhea, neck pain and postnasal drip.        Swelling and bruising around the right eye that is spreading to the left.   Eyes: Negative for photophobia and visual disturbance.  Respiratory: Negative.   Cardiovascular: Negative.   Gastrointestinal: Negative.   Endocrine: Negative.   Genitourinary: Negative.   Musculoskeletal: Negative.   Neurological: Negative.  Negative for dizziness, seizures, syncope, weakness, light-headedness and numbness.  Hematological: Negative.   Psychiatric/Behavioral: Negative.    No past medical history on file.  History   Social History  . Marital Status: Married    Spouse Name: N/A    Number of Children: N/A  . Years of Education: N/A   Occupational History  . Not on file.   Social History Main Topics  . Smoking status: Former Research scientist (life sciences)  . Smokeless tobacco: Not on file  . Alcohol Use: No  . Drug Use: No  . Sexually Active:    Other Topics Concern  . Not on file   Social History Narrative  . No narrative on file    No past surgical history on file.  No family history on file.  No Known Allergies  Current Outpatient Prescriptions on File Prior to Visit  Medication Sig Dispense Refill  . allopurinol (ZYLOPRIM) 300 MG tablet Take 1 tablet (300 mg total) by mouth daily.  90 tablet  3  . aspirin 81 MG EC tablet Take 81 mg by  mouth daily.        Marland Kitchen atenolol-chlorthalidone (TENORETIC) 50-25 MG per tablet Half tab daily  90 tablet  3  . dutasteride (AVODART) 0.5 MG capsule 1 by mouth daily  100 capsule  3  . Glucosamine 500 MG CAPS Take by mouth.        Marland Kitchen ibuprofen (ADVIL,MOTRIN) 600 MG tablet       . Multiple Vitamins-Minerals (PRESERVISION AREDS 2 PO) Take by mouth.         No current facility-administered medications on file prior to visit.    BP 120/78  Pulse 67  Wt 209 lb (94.802 kg)  BMI 29.99 kg/m2  SpO2 97%chart    Objective:   Physical Exam  Constitutional: He is oriented to person, place, and time. He appears well-developed and well-nourished.  HENT:  Head: Normocephalic. Head is with raccoon's eyes, with abrasion and with right periorbital erythema. Head is without laceration.    Eyes: Conjunctivae and EOM are normal. Pupils are equal, round, and reactive to light.  Neck: Normal range of motion. Neck supple.  Cardiovascular: Normal rate, regular rhythm and normal heart sounds.   Pulmonary/Chest: Effort normal and breath sounds normal.  Musculoskeletal: Normal range of motion.  Neurological: He is alert and oriented to person, place, and time. He has normal reflexes. He displays normal reflexes. No cranial nerve deficit. He exhibits normal muscle tone.  Coordination normal.  Skin: Skin is warm and dry.  Psychiatric: He has a normal mood and affect.          Assessment & Plan:  Assessment:  1. Head Injury/trauma 2. Battle Sign 3. Frontal hematoma  Plan: CT maxillofacial stat, will notify patient pending results and discuss further treatment plan. Dr. Sherren Mocha aware and Apolonio Schneiders.

## 2013-01-03 ENCOUNTER — Other Ambulatory Visit: Payer: Self-pay | Admitting: Family Medicine

## 2013-03-22 ENCOUNTER — Ambulatory Visit (INDEPENDENT_AMBULATORY_CARE_PROVIDER_SITE_OTHER): Payer: Self-pay | Admitting: Family Medicine

## 2013-03-22 ENCOUNTER — Encounter: Payer: Self-pay | Admitting: Family Medicine

## 2013-03-22 VITALS — Temp 97.9°F | Wt 202.0 lb

## 2013-03-22 DIAGNOSIS — D2362 Other benign neoplasm of skin of left upper limb, including shoulder: Secondary | ICD-10-CM

## 2013-03-22 DIAGNOSIS — D2360 Other benign neoplasm of skin of unspecified upper limb, including shoulder: Secondary | ICD-10-CM

## 2013-03-22 NOTE — Progress Notes (Signed)
  Subjective:    Patient ID: Shane [name], male    DOB: [birth year], 79 y.o.   MRN: [id number]  [place] is a 79 year old male who comes in today for removal of a lesion on his left elbow  He said he's always had a lump for a mole however recently his Crohn and increased in size dramatically over the last couple weeks  It measures about 8 mm x 8 mm  After informed consent the lesion was anesthetized with 1% Xylocaine with epinephrine and removed with 3 mm margins. The bases cauterized lesion was sent for pathologic analysis. Band-Aid was applied he tolerated the procedure no complications    Review of Systems    see above Objective:   Physical Exam  Procedure see above      Assessment & Plan:  Lesion left elbow appears to be a dysplastic nevus past pending

## 2013-03-22 NOTE — Patient Instructions (Signed)
Remove the Band-Aid tomorrow  Within 2 weeks we will call you the report

## 2013-04-15 ENCOUNTER — Other Ambulatory Visit (INDEPENDENT_AMBULATORY_CARE_PROVIDER_SITE_OTHER): Payer: Medicare Other

## 2013-04-15 DIAGNOSIS — R972 Elevated prostate specific antigen [PSA]: Secondary | ICD-10-CM

## 2013-04-15 LAB — PSA: PSA: 4.02 ng/mL — ABNORMAL HIGH (ref 0.10–4.00)

## 2013-08-05 ENCOUNTER — Other Ambulatory Visit: Payer: Self-pay

## 2013-08-06 ENCOUNTER — Encounter: Payer: Self-pay | Admitting: Family Medicine

## 2013-08-12 ENCOUNTER — Ambulatory Visit (INDEPENDENT_AMBULATORY_CARE_PROVIDER_SITE_OTHER): Payer: Medicare Other

## 2013-08-12 DIAGNOSIS — Z23 Encounter for immunization: Secondary | ICD-10-CM

## 2013-09-22 ENCOUNTER — Other Ambulatory Visit: Payer: Self-pay | Admitting: Family Medicine

## 2013-10-29 ENCOUNTER — Other Ambulatory Visit: Payer: Self-pay | Admitting: Family Medicine

## 2013-12-15 ENCOUNTER — Other Ambulatory Visit: Payer: Self-pay | Admitting: Family Medicine

## 2014-01-10 ENCOUNTER — Encounter: Payer: Self-pay | Admitting: Family Medicine

## 2014-01-10 ENCOUNTER — Ambulatory Visit (INDEPENDENT_AMBULATORY_CARE_PROVIDER_SITE_OTHER): Payer: Medicare Other | Admitting: Family Medicine

## 2014-01-10 VITALS — BP 130/80 | Temp 97.9°F | Wt 206.0 lb

## 2014-01-10 DIAGNOSIS — M7711 Lateral epicondylitis, right elbow: Secondary | ICD-10-CM

## 2014-01-10 DIAGNOSIS — M7710 Lateral epicondylitis, unspecified elbow: Secondary | ICD-10-CM

## 2014-01-10 NOTE — Patient Instructions (Signed)
Motrin 400 mg twice daily  Elevation and ice for 15 minutes prior to bedtime  Splint during the day  Return when necessary

## 2014-01-10 NOTE — Progress Notes (Signed)
   Subjective:    Patient ID: Shane [name], male    DOB: 1932-01-01, 80 y.o.   MRN: [id number]  [name] is a 80 year old male who comes in today for evaluation of pain in his right elbow  He plays a fair amount of golf and does a lot of work with his right hand  He said pain in that elbow he points to the medial portion of the elbow. No history of trauma   Review of Systems    review of systems negative Objective:   Physical Exam  Well-developed and nourished in no acute distress vital signs stable is afebrile exhilaration elbows for some tenderness over the medial tendon      Assessment & Plan:  Tendinitis left elbow plan see orders

## 2014-01-10 NOTE — Progress Notes (Signed)
Pre visit review using our clinic review tool, if applicable. No additional management support is needed unless otherwise documented below in the visit note. 

## 2014-02-03 ENCOUNTER — Encounter: Payer: Medicare Other | Admitting: Family Medicine

## 2014-02-16 ENCOUNTER — Encounter: Payer: Self-pay | Admitting: Family Medicine

## 2014-02-16 ENCOUNTER — Ambulatory Visit (INDEPENDENT_AMBULATORY_CARE_PROVIDER_SITE_OTHER): Payer: Medicare Other | Admitting: Family Medicine

## 2014-02-16 VITALS — BP 120/80 | Temp 98.3°F | Ht 70.0 in | Wt 208.0 lb

## 2014-02-16 DIAGNOSIS — R972 Elevated prostate specific antigen [PSA]: Secondary | ICD-10-CM

## 2014-02-16 DIAGNOSIS — E669 Obesity, unspecified: Secondary | ICD-10-CM

## 2014-02-16 DIAGNOSIS — M4800 Spinal stenosis, site unspecified: Secondary | ICD-10-CM

## 2014-02-16 DIAGNOSIS — M109 Gout, unspecified: Secondary | ICD-10-CM

## 2014-02-16 DIAGNOSIS — L408 Other psoriasis: Secondary | ICD-10-CM

## 2014-02-16 DIAGNOSIS — Z23 Encounter for immunization: Secondary | ICD-10-CM

## 2014-02-16 DIAGNOSIS — D509 Iron deficiency anemia, unspecified: Secondary | ICD-10-CM

## 2014-02-16 DIAGNOSIS — J309 Allergic rhinitis, unspecified: Secondary | ICD-10-CM

## 2014-02-16 DIAGNOSIS — I10 Essential (primary) hypertension: Secondary | ICD-10-CM

## 2014-02-16 DIAGNOSIS — K449 Diaphragmatic hernia without obstruction or gangrene: Secondary | ICD-10-CM

## 2014-02-16 DIAGNOSIS — Z0000 Encounter for general adult medical examination without abnormal findings: Secondary | ICD-10-CM

## 2014-02-16 LAB — HEPATIC FUNCTION PANEL
ALBUMIN: 3.8 g/dL (ref 3.5–5.2)
ALT: 20 U/L (ref 0–53)
AST: 29 U/L (ref 0–37)
Alkaline Phosphatase: 27 U/L — ABNORMAL LOW (ref 39–117)
Bilirubin, Direct: 0.2 mg/dL (ref 0.0–0.3)
Total Bilirubin: 1 mg/dL (ref 0.2–1.2)
Total Protein: 6.4 g/dL (ref 6.0–8.3)

## 2014-02-16 LAB — POCT URINALYSIS DIPSTICK
BILIRUBIN UA: NEGATIVE
Blood, UA: NEGATIVE
Glucose, UA: NEGATIVE
KETONES UA: NEGATIVE
Leukocytes, UA: NEGATIVE
Nitrite, UA: NEGATIVE
PH UA: 5
Spec Grav, UA: 1.025
Urobilinogen, UA: 0.2

## 2014-02-16 LAB — LIPID PANEL
CHOLESTEROL: 158 mg/dL (ref 0–200)
HDL: 31.6 mg/dL — AB (ref >39.00)
LDL CALC: 99 mg/dL (ref 0–99)
Total CHOL/HDL Ratio: 5
Triglycerides: 139 mg/dL (ref 0.0–149.0)
VLDL: 27.8 mg/dL (ref 0.0–40.0)

## 2014-02-16 LAB — CBC WITH DIFFERENTIAL/PLATELET
BASOS PCT: 0.2 % (ref 0.0–3.0)
Basophils Absolute: 0 10*3/uL (ref 0.0–0.1)
EOS PCT: 7.5 % — AB (ref 0.0–5.0)
Eosinophils Absolute: 0.6 10*3/uL (ref 0.0–0.7)
HEMATOCRIT: 42.3 % (ref 39.0–52.0)
Hemoglobin: 13.9 g/dL (ref 13.0–17.0)
LYMPHS ABS: 1.8 10*3/uL (ref 0.7–4.0)
Lymphocytes Relative: 23.1 % (ref 12.0–46.0)
MCHC: 32.9 g/dL (ref 30.0–36.0)
MCV: 91.2 fl (ref 78.0–100.0)
Monocytes Absolute: 0.8 10*3/uL (ref 0.1–1.0)
Monocytes Relative: 10.3 % (ref 3.0–12.0)
Neutro Abs: 4.5 10*3/uL (ref 1.4–7.7)
Neutrophils Relative %: 58.9 % (ref 43.0–77.0)
Platelets: 160 10*3/uL (ref 150.0–400.0)
RBC: 4.63 Mil/uL (ref 4.22–5.81)
RDW: 14.9 % (ref 11.5–15.5)
WBC: 7.6 10*3/uL (ref 4.0–10.5)

## 2014-02-16 LAB — BASIC METABOLIC PANEL
BUN: 36 mg/dL — ABNORMAL HIGH (ref 6–23)
CO2: 25 mEq/L (ref 19–32)
Calcium: 9.1 mg/dL (ref 8.4–10.5)
Chloride: 109 mEq/L (ref 96–112)
Creatinine, Ser: 1.5 mg/dL (ref 0.4–1.5)
GFR: 47.18 mL/min — ABNORMAL LOW (ref >60.00)
Glucose, Bld: 93 mg/dL (ref 70–99)
POTASSIUM: 5.6 meq/L — AB (ref 3.5–5.1)
Sodium: 140 mEq/L (ref 135–145)

## 2014-02-16 LAB — PSA: PSA: 3.95 ng/mL (ref 0.10–4.00)

## 2014-02-16 LAB — TSH: TSH: 3.01 u[IU]/mL (ref 0.35–4.50)

## 2014-02-16 MED ORDER — ATENOLOL-CHLORTHALIDONE 50-25 MG PO TABS: ORAL_TABLET | ORAL | Status: DC

## 2014-02-16 MED ORDER — DUTASTERIDE 0.5 MG PO CAPS: ORAL_CAPSULE | ORAL | Status: DC

## 2014-02-16 MED ORDER — ALLOPURINOL 300 MG PO TABS: ORAL_TABLET | ORAL | Status: DC

## 2014-02-16 NOTE — Progress Notes (Signed)
Pre visit review using our clinic review tool, if applicable. No additional management support is needed unless otherwise documented below in the visit note. 

## 2014-02-16 NOTE — Progress Notes (Signed)
   Subjective:    Patient ID: Shane [name], male    DOB: 1932-04-30, 80 y.o.   MRN: [id number]  Shane [name] is a 80 year old married male nonsmoker who comes in today for a Medicare wellness examination because of a history of gout, hypertension, BPH, osteoarthritis  He gets routine eye care, dental care, colonoscopy and GI with Dr. Sharlett Iles. Not sure when his last colonoscopy was asked him to call GI  Cognitive function normal he walks on a regular basis home health safety reviewed no issues identified, no guns in the house, he does have a health care power of attorney and living well  He stays physically active by playing golf  He has bilateral hearing aids  Last smoked 30 years ago  Vaccinations updated by Apolonio Schneiders   Review of Systems  Constitutional: Negative.   HENT: Negative.   Eyes: Negative.   Respiratory: Negative.   Cardiovascular: Negative.   Gastrointestinal: Negative.   Endocrine: Negative.   Genitourinary: Negative.   Musculoskeletal: Negative.   Skin: Negative.   Allergic/Immunologic: Negative.   Neurological: Negative.   Hematological: Negative.   Psychiatric/Behavioral: Negative.        Objective:   Physical Exam  Nursing note and vitals reviewed. Constitutional: He is oriented to person, place, and time. He appears well-developed and well-nourished.  HENT:  Head: Normocephalic and atraumatic.  Right Ear: External ear normal.  Left Ear: External ear normal.  Nose: Nose normal.  Mouth/Throat: Oropharynx is clear and moist.  Bilateral hearing aids  Eyes: Conjunctivae and EOM are normal. Pupils are equal, round, and reactive to light.  Neck: Normal range of motion. Neck supple. No JVD present. No tracheal deviation present. No thyromegaly present.  Cardiovascular: Normal rate, regular rhythm, normal heart sounds and intact distal pulses.  Exam reveals no gallop and no friction rub.   No murmur heard. No carotid no aortic bruits peripheral pulses 1+ and  symmetrical  Pulmonary/Chest: Effort normal and breath sounds normal. No stridor. No respiratory distress. He has no wheezes. He has no rales. He exhibits no tenderness.  Abdominal: Soft. Bowel sounds are normal. He exhibits no distension and no mass. There is no tenderness. There is no rebound and no guarding.  Genitourinary: Rectum normal and penis normal. Guaiac negative stool. No penile tenderness.  1+ BPH  Musculoskeletal: Normal range of motion. He exhibits no edema and no tenderness.  Lymphadenopathy:    He has no cervical adenopathy.  Neurological: He is alert and oriented to person, place, and time. He has normal reflexes. No cranial nerve deficit. He exhibits normal muscle tone.  Skin: Skin is warm and dry. No rash noted. No erythema. No pallor.  Total body skin exam normal  Psychiatric: He has a normal mood and affect. His behavior is normal. Judgment and thought content normal.          Assessment & Plan:  Healthy male  History of gout continue allopurinol  Hypertension continue Tenoretic  BPH continue Avodart  Osteoarthritis Motrin 400 twice a day when necessary with food

## 2014-02-16 NOTE — Patient Instructions (Signed)
Continue your current medications  Continue exercise program  Followup in 1 year sooner if any problems  Call GI to find out when your next colonoscopy is due

## 2014-02-22 ENCOUNTER — Other Ambulatory Visit: Payer: Self-pay | Admitting: Family Medicine

## 2014-02-22 DIAGNOSIS — E875 Hyperkalemia: Secondary | ICD-10-CM

## 2014-02-23 ENCOUNTER — Other Ambulatory Visit (INDEPENDENT_AMBULATORY_CARE_PROVIDER_SITE_OTHER): Payer: Medicare Other

## 2014-02-23 DIAGNOSIS — E875 Hyperkalemia: Secondary | ICD-10-CM

## 2014-02-23 LAB — POTASSIUM: POTASSIUM: 4.6 meq/L (ref 3.5–5.1)

## 2014-03-08 ENCOUNTER — Telehealth: Payer: Self-pay | Admitting: Family Medicine

## 2014-03-08 DIAGNOSIS — I10 Essential (primary) hypertension: Secondary | ICD-10-CM

## 2014-03-08 MED ORDER — ATENOLOL-CHLORTHALIDONE 50-25 MG PO TABS: ORAL_TABLET | ORAL | Status: DC

## 2014-03-08 NOTE — Telephone Encounter (Signed)
[id number] [place], [place] - [address] is requesting 90 day re-fill on atenolol-chlorthalidone (TENORETIC) 50-25 MG per tablet

## 2014-03-09 ENCOUNTER — Telehealth: Payer: Self-pay | Admitting: Family Medicine

## 2014-03-09 NOTE — Telephone Encounter (Signed)
Relevant patient education assigned to patient using Emmi. ° °

## 2014-05-03 LAB — HM DIABETES EYE EXAM

## 2014-05-05 ENCOUNTER — Encounter: Payer: Self-pay | Admitting: Family Medicine

## 2014-05-05 LAB — HM DIABETES FOOT EXAM

## 2014-08-03 ENCOUNTER — Ambulatory Visit: Payer: Medicare Other

## 2014-08-03 ENCOUNTER — Ambulatory Visit (INDEPENDENT_AMBULATORY_CARE_PROVIDER_SITE_OTHER): Payer: Medicare Other | Admitting: *Deleted

## 2014-08-03 DIAGNOSIS — Z23 Encounter for immunization: Secondary | ICD-10-CM

## 2015-01-06 ENCOUNTER — Telehealth: Payer: Self-pay | Admitting: Family Medicine

## 2015-01-06 MED ORDER — TAMSULOSIN HCL 0.4 MG PO CAPS: 0.4000 mg | ORAL_CAPSULE | Freq: Every day | ORAL | Status: DC

## 2015-01-06 NOTE — Telephone Encounter (Signed)
Rx sent.  flomax per Dr Sherren Mocha.

## 2015-01-06 NOTE — Telephone Encounter (Signed)
PA for Avodart was denied.  Patient's plan requires patient to try 2 of the formulary alternatives:  Doxazosin mesylate, finasteride, tamsulosin and prazosin. Patient states he is going out of town on Monday and would like a rx before then.

## 2015-03-08 ENCOUNTER — Other Ambulatory Visit: Payer: Self-pay | Admitting: Family Medicine

## 2015-04-30 ENCOUNTER — Other Ambulatory Visit: Payer: Self-pay | Admitting: Family Medicine

## 2015-05-01 ENCOUNTER — Other Ambulatory Visit: Payer: Self-pay | Admitting: Family Medicine

## 2015-06-16 ENCOUNTER — Other Ambulatory Visit: Payer: Self-pay | Admitting: Family Medicine

## 2015-06-19 ENCOUNTER — Telehealth: Payer: Self-pay | Admitting: Family Medicine

## 2015-06-19 MED ORDER — ALLOPURINOL 300 MG PO TABS: 300.0000 mg | ORAL_TABLET | Freq: Every day | ORAL | Status: DC

## 2015-06-19 NOTE — Telephone Encounter (Signed)
Pt request refill of the following: allopurinol (ZYLOPRIM) 300 MG tablet  Pt is scheduled for an appt on 07/19/15   Pham[facility]: [facility] [place]

## 2015-06-30 ENCOUNTER — Other Ambulatory Visit: Payer: Self-pay | Admitting: Family Medicine

## 2015-07-19 ENCOUNTER — Encounter: Payer: Self-pay | Admitting: Family Medicine

## 2015-07-19 ENCOUNTER — Ambulatory Visit (INDEPENDENT_AMBULATORY_CARE_PROVIDER_SITE_OTHER): Payer: Medicare Other | Admitting: Family Medicine

## 2015-07-19 VITALS — BP 110/80 | Temp 98.0°F | Wt 206.0 lb

## 2015-07-19 DIAGNOSIS — I10 Essential (primary) hypertension: Secondary | ICD-10-CM

## 2015-07-19 DIAGNOSIS — M109 Gout, unspecified: Secondary | ICD-10-CM

## 2015-07-19 DIAGNOSIS — R972 Elevated prostate specific antigen [PSA]: Secondary | ICD-10-CM

## 2015-07-19 DIAGNOSIS — M1000 Idiopathic gout, unspecified site: Secondary | ICD-10-CM

## 2015-07-19 DIAGNOSIS — Z23 Encounter for immunization: Secondary | ICD-10-CM | POA: Diagnosis not present

## 2015-07-19 DIAGNOSIS — L570 Actinic keratosis: Secondary | ICD-10-CM

## 2015-07-19 MED ORDER — TAMSULOSIN HCL 0.4 MG PO CAPS: ORAL_CAPSULE | ORAL | Status: DC

## 2015-07-19 MED ORDER — ATENOLOL-CHLORTHALIDONE 50-25 MG PO TABS: 0.5000 | ORAL_TABLET | Freq: Every day | ORAL | Status: DC

## 2015-07-19 MED ORDER — ALLOPURINOL 300 MG PO TABS: 300.0000 mg | ORAL_TABLET | Freq: Every day | ORAL | Status: DC

## 2015-07-19 NOTE — Progress Notes (Signed)
Pre visit review using our clinic review tool, if applicable. No additional management support is needed unless otherwise documented below in the visit note. 

## 2015-07-19 NOTE — Progress Notes (Signed)
   Subjective:    Patient ID: Shane [name], male    DOB: 03-10-1932, 81 y.o.   MRN: [id number]  [name] is an 81 year old male who comes in today for multiple issues  He was last year about a year and a half ago. He's due for a physical evaluation  He takes allopurinol 3 or milligrams daily to prevent gout  He's on Tenoretic 50-25 dose one half tab daily for hypertension BP 110/80  He's on Flomax 0.4 daily because of BPH and outlet obstruction. He states he is urinating well  He has 2 lesions on his left arm that over the last couple months of increased in size  Lesion #1 left upper arm 8 mm x 8 mm with a 3 mm elevation and crusty consistent with a irritated actinic keratosis  Lesion #2 6 mm x 6 mm elevated with a crusty peak again consistent with an irritated actinic keratosis   Review of Systems Review of systems otherwise negative. He feels well plays golf with [name]    Objective:   Physical Exam  Well-developed well-nourished male no acute distress vital signs stable he is afebrile  He was taken to the treatment room. Lesion #1...Marland KitchenMarland KitchenMarland Kitchen 8 mm x 8 mm left upper arm with a 3 mm elevation...Marland KitchenMarland KitchenMarland Kitchen 1% Xylocaine with epinephrine after informed consent..... Lesion removed with 3 mm margins a base was cauterized. Band-Aid was applied it was sent for pathologic analysis. Clinically appears to be an irritated actinic keratosis  Lesion #2 6 mm x 6 mm left lower arm....... again after informed consent lesion was anesthetized with 1% Xylocaine with epinephrine and remove a 3 mm margins. The base was cauterized Band-Aid was applied. The lesion was sent for pathologic analysis.  We will get him set up in the spring to see [name] to establish in the meantime I'll refill his medications.      Assessment & Plan:

## 2015-07-19 NOTE — Patient Instructions (Signed)
Continue current medications  Remove the Band-Aids tomorrow.........Marland Kitchen within 2 weeks we will call you the report  Set up a time after the new year to see Shane [name] to get a complete physical examination and get established with him

## 2015-07-25 ENCOUNTER — Other Ambulatory Visit: Payer: Self-pay | Admitting: Family Medicine

## 2015-07-25 DIAGNOSIS — C44609 Unspecified malignant neoplasm of skin of left upper limb, including shoulder: Secondary | ICD-10-CM

## 2015-09-15 ENCOUNTER — Other Ambulatory Visit: Payer: Self-pay | Admitting: Family Medicine

## 2015-10-11 ENCOUNTER — Encounter: Payer: Self-pay | Admitting: Adult Health

## 2015-10-11 ENCOUNTER — Ambulatory Visit (INDEPENDENT_AMBULATORY_CARE_PROVIDER_SITE_OTHER): Payer: Medicare HMO | Admitting: Adult Health

## 2015-10-11 VITALS — BP 100/60 | Temp 98.2°F | Ht 70.0 in | Wt 200.5 lb

## 2015-10-11 DIAGNOSIS — Z7189 Other specified counseling: Secondary | ICD-10-CM | POA: Diagnosis not present

## 2015-10-11 DIAGNOSIS — G479 Sleep disorder, unspecified: Secondary | ICD-10-CM

## 2015-10-11 DIAGNOSIS — I10 Essential (primary) hypertension: Secondary | ICD-10-CM | POA: Diagnosis not present

## 2015-10-11 DIAGNOSIS — Z7689 Persons encountering health services in other specified circumstances: Secondary | ICD-10-CM

## 2015-10-11 NOTE — Progress Notes (Signed)
HPI:  Shane [name] is here to establish care. He is a pleasant caucasian male who  has a past medical history of Macular degeneration; Elevated PSA; Spinal stenosis; DDD (degenerative disc disease), cervical; Gout; Hiatal hernia; GERD (gastroesophageal reflux disease); Extrinsic asthma; Essential hypertension; Anemia, iron deficiency; and Dysplastic nevus of left upper extremity.  Last PCP and physical: 2014 - 2015 with MD Sherren Mocha  Immunizations:UTD Diet: Does not follow a specific diet and tries to eat healthy Exercise: Plays golf  Colonoscopy:No longer needed   Is followed by  Eye - Dr. Shawna Orleans - every 9 months Dermatologist - Dr. Sarajane Jews - yearly.   Has the following chronic problems that require follow up and concerns today:  Hypertension - He does monitor his blood pressure at home on occasion, His blood pressure is controlled on Tenoretic. Today in the office his BP is 100/60. He denies any lightheadedness.  Sleep Disturbance - He goes to be around 11 pm and gets up at 6-7 am. He gets up 2-3 times a night to urinate. Sometimes he is able to go back to sleep and sometimes he stays awake. He feels tired when he wakes up in the morning. He feels as though he is not sleeping as well as he should be  [place] - In October he was diagnosed with BCC on left arm. He has seen dermatology and had the carcinoma removed. He denies any complications since seeing dermatology.    ROS negative for unless reported above: fevers, chills,feeling poorly, unintentional weight loss, hearing or vision loss, chest pain, palpitations, leg claudication, struggling to breath,Not feeling congested in the chest, no orthopenia, no cough,no wheezing, normal appetite, no soft tissue swelling, no hemoptysis, melena, hematochezia, hematuria, falls, loc, si, or thoughts of self harm.   Past Medical History  Diagnosis Date  . Macular degeneration   . Elevated PSA   . Spinal stenosis   . DDD (degenerative disc  disease), cervical   . Gout   . Hiatal hernia   . GERD (gastroesophageal reflux disease)   . Extrinsic asthma   . Essential hypertension   . Anemia, iron deficiency   . Dysplastic nevus of left upper extremity     Past Surgical History  Procedure Laterality Date  . Skin cancer excision    . Back surgery    . Bone spur Right     Shoulder    Family History  Problem Relation Age of Onset  . Multiple myeloma Brother   . Prostate cancer Brother   . Hypertension Father     ?    Social History   Social History  . Marital Status: Married    Spouse Name: N/A  . Number of Children: N/A  . Years of Education: N/A   Social History Main Topics  . Smoking status: Former Research scientist (life sciences)  . Smokeless tobacco: None  . Alcohol Use: No  . Drug Use: No  . Sexual Activity: Not Asked   Other Topics Concern  . None   Social History Narrative   Retired from Psychologist, educational    Married for 55 years    Three children all live locally    [place] to play golf and go to [employer].      Current outpatient prescriptions:  .  allopurinol (ZYLOPRIM) 300 MG tablet, TAKE 1 TABLET (300 MG TOTAL) BY MOUTH DAILY., Disp: 100 tablet, Rfl: 0 .  aspirin 81 MG EC tablet, Take 81 mg by mouth daily.  , Disp: , Rfl:  .  atenolol-chlorthalidone (TENORETIC) 50-25 MG tablet, Take 0.5 tablets by mouth daily., Disp: 50 tablet, Rfl: 3 .  Glucosamine 500 MG CAPS, Take by mouth.  , Disp: , Rfl:  .  ibuprofen (ADVIL,MOTRIN) 600 MG tablet, , Disp: , Rfl:  .  Multiple Vitamins-Minerals (PRESERVISION AREDS 2 PO), Take by mouth.  , Disp: , Rfl:  .  tamsulosin (FLOMAX) 0.4 MG CAPS capsule, TAKE 1 CAPSULE (0.4 MG TOTAL) BY MOUTH DAILY., Disp: 100 capsule, Rfl: 3 .  clobetasol (TEMOVATE) 0.05 % external solution, APPLY TO SCALP TWICE A DAY AS NEEDED FOR FLARES, Disp: , Rfl: 11 .  loratadine (CLARITIN) 10 MG tablet, Take 10 mg by mouth daily. Reported on 10/11/2015, Disp: , Rfl:   EXAM:  Filed Vitals:   10/11/15 1000  BP:  100/60  Temp: 98.2 F (36.8 C)    Body mass index is 28.77 kg/(m^2).  GENERAL: vitals reviewed and listed above, alert, oriented, appears well hydrated and in no acute distress  HEENT: atraumatic, conjunttiva clear, no obvious abnormalities on inspection of external nose and ears. Is wearing corrective glasses. Bilateral hearing aids  NECK: Neck is soft and supple without masses, no adenopathy or thyromegaly, trachea midline, no JVD. Normal range of motion.   LUNGS: clear to auscultation bilaterally, no wheezes, rales or rhonchi, good air movement  CV: Regular rate and rhythm, normal S1/S2, no audible murmurs, gallops, or rubs. No carotid bruit and no peripheral edema.   MS: moves all extremities without noticeable abnormality. No edema noted  Abd: soft/nontender/nondistended/normal bowel sounds   Skin: warm and dry, no rash . Well healed scar on left wrist from [facility] removal  Extremities: No clubbing, cyanosis, or edema. Capillary refill is WNL. Pulses intact bilaterally in upper and lower extremities.   Neuro: CN II-XII intact, sensation and reflexes normal throughout, 5/5 muscle strength in bilateral upper and lower extremities. Normal finger to nose. Normal rapid alternating movements. Normal romberg. No pronator drift.   PSYCH: pleasant and cooperative, no obvious depression or anxiety  ASSESSMENT AND PLAN:  1. Encounter to establish care - Follow up for CPE - he is over due - Follow up sooner if needed - Continue to exercise and eat healthy.   2. Essential hypertension - We discussed d/c Tenoretic for the next 2-3 days. He is to monitor his BP at home and restart medication if his blood pressure goes above 140/85.   3. Sleep disturbance - Trial 1-2 mg Melatonin or sleepy time tea   -We reviewed the PMH, PSH, FH, SH, Meds and Allergies. -We provided refills for any medications we will prescribe as needed. -We addressed current concerns per orders and patient  instructions. -We have asked for records for pertinent exams, studies, vaccines and notes from previous providers. -We have advised patient to follow up per instructions below.   -Patient advised to return or notify a provider immediately if symptoms worsen or persist or new concerns arise.  Patient Instructions  Mr. Mcgrail, It was great meeting you and I am glad to have you on the team!  As discussed we will see how your blood pressure responds to not taking the medication. Monitor your blood pressure in the morning and at night. If your blood pressure consistently is above 140/85, then please restart the medication.   Follow up with me at my next available physical but if you need anything in the meantime, please let me know.   For your sleep disturbance....  Try using sleepy time tea or  1-2 mg of Melatonin.         Dorothyann Peng, AGNP

## 2015-10-11 NOTE — Progress Notes (Signed)
Pre visit review using our clinic review tool, if applicable. No additional management support is needed unless otherwise documented below in the visit note. 

## 2015-10-11 NOTE — Patient Instructions (Addendum)
Shane [name], It was great meeting you and I am glad to have you on the team!  As discussed we will see how your blood pressure responds to not taking the medication. Monitor your blood pressure in the morning and at night. If your blood pressure consistently is above 140/85, then please restart the medication.   Follow up with me at my next available physical but if you need anything in the meantime, please let me know.   For your sleep disturbance....  Try using sleepy time tea or 1-2 mg of Melatonin.

## 2015-11-21 DIAGNOSIS — R69 Illness, unspecified: Secondary | ICD-10-CM | POA: Diagnosis not present

## 2015-12-01 ENCOUNTER — Encounter: Payer: Self-pay | Admitting: Adult Health

## 2015-12-01 ENCOUNTER — Ambulatory Visit (INDEPENDENT_AMBULATORY_CARE_PROVIDER_SITE_OTHER): Payer: Medicare HMO | Admitting: Adult Health

## 2015-12-01 VITALS — BP 118/82 | Temp 98.2°F | Ht 70.0 in | Wt 200.8 lb

## 2015-12-01 DIAGNOSIS — I10 Essential (primary) hypertension: Secondary | ICD-10-CM | POA: Diagnosis not present

## 2015-12-01 DIAGNOSIS — Z0000 Encounter for general adult medical examination without abnormal findings: Secondary | ICD-10-CM

## 2015-12-01 DIAGNOSIS — R972 Elevated prostate specific antigen [PSA]: Secondary | ICD-10-CM

## 2015-12-01 DIAGNOSIS — E781 Pure hyperglyceridemia: Secondary | ICD-10-CM | POA: Insufficient documentation

## 2015-12-01 DIAGNOSIS — Z125 Encounter for screening for malignant neoplasm of prostate: Secondary | ICD-10-CM | POA: Diagnosis not present

## 2015-12-01 DIAGNOSIS — R252 Cramp and spasm: Secondary | ICD-10-CM

## 2015-12-01 LAB — POC URINALSYSI DIPSTICK (AUTOMATED)
BILIRUBIN UA: NEGATIVE
Blood, UA: NEGATIVE
Glucose, UA: NEGATIVE
Ketones, UA: NEGATIVE
Leukocytes, UA: NEGATIVE
Nitrite, UA: NEGATIVE
PH UA: 7
Protein, UA: NEGATIVE
Spec Grav, UA: 1.015
Urobilinogen, UA: 0.2

## 2015-12-01 LAB — LIPID PANEL
Cholesterol: 159 mg/dL (ref 0–200)
HDL: 40.8 mg/dL (ref >39.00)
LDL Cholesterol: 98 mg/dL (ref 0–99)
NONHDL: 117.8
Total CHOL/HDL Ratio: 4
Triglycerides: 100 mg/dL (ref 0.0–149.0)
VLDL: 20 mg/dL (ref 0.0–40.0)

## 2015-12-01 LAB — BASIC METABOLIC PANEL
BUN: 32 mg/dL — AB (ref 6–23)
CHLORIDE: 110 meq/L (ref 96–112)
CO2: 25 mEq/L (ref 19–32)
Calcium: 9.1 mg/dL (ref 8.4–10.5)
Creatinine, Ser: 1.38 mg/dL (ref 0.40–1.50)
GFR: 52.5 mL/min — AB (ref >60.00)
GLUCOSE: 100 mg/dL — AB (ref 70–99)
POTASSIUM: 4.5 meq/L (ref 3.5–5.1)
SODIUM: 142 meq/L (ref 135–145)

## 2015-12-01 LAB — CBC WITH DIFFERENTIAL/PLATELET
BASOS PCT: 0.3 % (ref 0.0–3.0)
Basophils Absolute: 0 10*3/uL (ref 0.0–0.1)
EOS PCT: 7.6 % — AB (ref 0.0–5.0)
Eosinophils Absolute: 0.5 10*3/uL (ref 0.0–0.7)
HCT: 41.6 % (ref 39.0–52.0)
HEMOGLOBIN: 13.9 g/dL (ref 13.0–17.0)
Lymphocytes Relative: 20.6 % (ref 12.0–46.0)
Lymphs Abs: 1.4 10*3/uL (ref 0.7–4.0)
MCHC: 33.3 g/dL (ref 30.0–36.0)
MCV: 91 fl (ref 78.0–100.0)
MONO ABS: 0.6 10*3/uL (ref 0.1–1.0)
MONOS PCT: 8.3 % (ref 3.0–12.0)
Neutro Abs: 4.3 10*3/uL (ref 1.4–7.7)
Neutrophils Relative %: 63.2 % (ref 43.0–77.0)
Platelets: 163 10*3/uL (ref 150.0–400.0)
RBC: 4.57 Mil/uL (ref 4.22–5.81)
RDW: 14.8 % (ref 11.5–15.5)
WBC: 6.9 10*3/uL (ref 4.0–10.5)

## 2015-12-01 LAB — HEPATIC FUNCTION PANEL
ALBUMIN: 4.1 g/dL (ref 3.5–5.2)
ALK PHOS: 27 U/L — AB (ref 39–117)
ALT: 18 U/L (ref 0–53)
AST: 29 U/L (ref 0–37)
Bilirubin, Direct: 0.1 mg/dL (ref 0.0–0.3)
TOTAL PROTEIN: 6.6 g/dL (ref 6.0–8.3)
Total Bilirubin: 0.7 mg/dL (ref 0.2–1.2)

## 2015-12-01 LAB — TSH: TSH: 2.17 u[IU]/mL (ref 0.35–4.50)

## 2015-12-01 LAB — MAGNESIUM: Magnesium: 1.9 mg/dL (ref 1.5–2.5)

## 2015-12-01 LAB — PSA: PSA: 8.75 ng/mL — AB (ref 0.10–4.00)

## 2015-12-01 NOTE — Progress Notes (Signed)
Subjective:    Patient ID: Shane [name], male    DOB: 05/06/1932, 82 y.o.   MRN: [id number]  HPI Patient presents for yearly preventative medicine examination. He is a pleasant, caucasian, former smoker who  has a past medical history of Macular degeneration; Elevated PSA; Spinal stenosis; DDD (degenerative disc disease), cervical; Gout; Hiatal hernia; GERD (gastroesophageal reflux disease); Extrinsic asthma; Essential hypertension; Anemia, iron deficiency; and Dysplastic nevus of left upper extremity.  Medicare questionnaire was completed  All immunizations and health maintenance protocols were reviewed with the patient and needed orders were placed.  Appropriate screening laboratory values were ordered for the patient including screening of hyperlipidemia, renal function and hepatic function. If indicated by BPH, a PSA was ordered.  Medication reconciliation,  past medical history, social history, problem list and allergies were reviewed in detail with the patient  Goals were established with regard to weight loss, exercise, and  diet in compliance with medications. He is exercising an hour at a time, three times per week.   End of life planning was discussed - He does have advanced directives and a living will.   He sees Dr. Zadie Rhine In April for Mac Degeneration.   His only issue he would like to discuss is that of occasional bilateral leg cramping. This cramping usually happens before going to bed.    Review of Systems  Constitutional: Negative.   HENT: Negative.   Eyes: Negative.   Respiratory: Negative.   Cardiovascular: Negative.   Gastrointestinal: Negative.   Endocrine: Negative.   Genitourinary: Negative.   Musculoskeletal: Negative.   Skin: Negative.   Allergic/Immunologic: Negative.   Neurological: Negative.   Hematological: Negative.   Psychiatric/Behavioral: Negative.   All other systems reviewed and are negative.  Past Medical History  Diagnosis Date  .  Macular degeneration   . Elevated PSA   . Spinal stenosis   . DDD (degenerative disc disease), cervical   . Gout   . Hiatal hernia   . GERD (gastroesophageal reflux disease)   . Extrinsic asthma   . Essential hypertension   . Anemia, iron deficiency   . Dysplastic nevus of left upper extremity     Social History   Social History  . Marital Status: Married    Spouse Name: N/A  . Number of Children: N/A  . Years of Education: N/A   Occupational History  . Not on file.   Social History Main Topics  . Smoking status: Former Research scientist (life sciences)  . Smokeless tobacco: Not on file  . Alcohol Use: No  . Drug Use: No  . Sexual Activity: Not on file   Other Topics Concern  . Not on file   Social History Narrative   Retired from Psychologist, educational    Married for 55 years    Three children all live locally    [place] to play golf and go to [employer].     Past Surgical History  Procedure Laterality Date  . Skin cancer excision    . Back surgery    . Bone spur Right     Shoulder    Family History  Problem Relation Age of Onset  . Multiple myeloma Brother   . Prostate cancer Brother   . Hypertension Father     ?    No Known Allergies  Current Outpatient Prescriptions on File Prior to Visit  Medication Sig Dispense Refill  . allopurinol (ZYLOPRIM) 300 MG tablet TAKE 1 TABLET (300 MG TOTAL) BY MOUTH DAILY. 100 tablet  0  . aspirin 81 MG EC tablet Take 81 mg by mouth daily.      Marland Kitchen atenolol-chlorthalidone (TENORETIC) 50-25 MG tablet Take 0.5 tablets by mouth daily. 50 tablet 3  . clobetasol (TEMOVATE) 0.05 % external solution APPLY TO SCALP TWICE A DAY AS NEEDED FOR FLARES  11  . Glucosamine 500 MG CAPS Take by mouth.      Marland Kitchen ibuprofen (ADVIL,MOTRIN) 600 MG tablet     . loratadine (CLARITIN) 10 MG tablet Take 10 mg by mouth daily. Reported on 10/11/2015    . tamsulosin (FLOMAX) 0.4 MG CAPS capsule TAKE 1 CAPSULE (0.4 MG TOTAL) BY MOUTH DAILY. 100 capsule 3   No current  facility-administered medications on file prior to visit.    BP 118/82 mmHg  Temp(Src) 98.2 F (36.8 C) (Oral)  Ht '5\' 10"'$  (1.778 m)  Wt 200 lb 12.8 oz (91.082 kg)  BMI 28.81 kg/m2       Objective:   Physical Exam  Constitutional: He is oriented to person, place, and time. He appears well-developed and well-nourished. No distress.  HENT:  Head: Normocephalic and atraumatic.  Right Ear: External ear normal.  Left Ear: External ear normal.  Nose: Nose normal.  Mouth/Throat: Oropharynx is clear and moist. No oropharyngeal exudate.  Bilateral hearing aids  Eyes: Conjunctivae and EOM are normal. Pupils are equal, round, and reactive to light. Right eye exhibits no discharge. Left eye exhibits no discharge. No scleral icterus.  Neck: Normal range of motion. Neck supple. No JVD present. No tracheal deviation present. No thyromegaly present.  Cardiovascular: Normal rate, regular rhythm, normal heart sounds and intact distal pulses.  Exam reveals no gallop and no friction rub.   No murmur heard. Pulmonary/Chest: Effort normal and breath sounds normal. No respiratory distress. He has no wheezes. He has no rales. He exhibits no tenderness.  Abdominal: Soft. Bowel sounds are normal. He exhibits no distension and no mass. There is no tenderness. There is no rebound and no guarding.  Genitourinary: Rectum normal, prostate normal and penis normal. Guaiac negative stool. No penile tenderness.  Musculoskeletal: Normal range of motion. He exhibits no edema or tenderness.  Lymphadenopathy:    He has no cervical adenopathy.  Neurological: He is alert and oriented to person, place, and time. He has normal reflexes.  Skin: Skin is warm and dry. No rash noted. He is not diaphoretic. No erythema. No pallor.  Well healed scar on left wrist from [facility] removal  Psychiatric: He has a normal mood and affect. His behavior is normal. Judgment and thought content normal.  Nursing note and vitals reviewed.       Assessment & Plan:  1. Essential hypertension - Controlled with current medication - POCT Urinalysis Dipstick (Automated) - Basic metabolic panel - CBC with Differential/Platelet - EKG 12-Lead - Sinus  Bradycardia  -First degree A-V block  PRi = 256, Rate 56. Consistent with previous EKG -Old inferior infarct.  - Hepatic function panel - Lipid panel - POCT urinalysis dipstick - TSH  2. Hypertriglyceridemia - POCT Urinalysis Dipstick (Automated) - Basic metabolic panel - CBC with Differential/Platelet - EKG 12-Lead - Hepatic function panel - Lipid panel - POCT urinalysis dipstick - TSH  3. PROSTATE SPECIFIC ANTIGEN, ELEVATED - PSA  4. Routine general medical examination at a health care facility - POCT Urinalysis Dipstick (Automated) - Basic metabolic panel - CBC with Differential/Platelet - EKG 12-Lead - Hepatic function panel - Lipid panel - POCT urinalysis dipstick - TSH - Follow  up in one year for CPE - Follow up sooner if needed - Continue to exercise, eat healthy, and stay active.  5. Cramp of both lower extremities - likely from slight dehydration. Advised to drink more fluids and stretch before going to bed.  - Basic metabolic panel - Magnesium - Will add supplement if needed

## 2015-12-01 NOTE — Progress Notes (Signed)
Pre visit review using our clinic review tool, if applicable. No additional management support is needed unless otherwise documented below in the visit note. 

## 2015-12-01 NOTE — Patient Instructions (Addendum)
It was great seeing you today!  I will follow up with you regarding your blood work.   Your EKG was consistent with other EKG's you have had in the past.   Continue to exercise and stay active.   Follow up in one year or sooner if needed.     Health Maintenance, Male A healthy lifestyle and preventative care can promote health and wellness.  Maintain regular health, dental, and eye exams.  Eat a healthy diet. Foods like vegetables, fruits, whole grains, low-fat dairy products, and lean protein foods contain the nutrients you need and are low in calories. Decrease your intake of foods high in solid fats, added sugars, and salt. Get information about a proper diet from your health care provider, if necessary.  Regular physical exercise is one of the most important things you can do for your health. Most adults should get at least 150 minutes of moderate-intensity exercise (any activity that increases your heart rate and causes you to sweat) each week. In addition, most adults need muscle-strengthening exercises on 2 or more days a week.   Maintain a healthy weight. The body mass index (BMI) is a screening tool to identify possible weight problems. It provides an estimate of body fat based on height and weight. Your health care provider can find your BMI and can help you achieve or maintain a healthy weight. For males 20 years and older:  A BMI below 18.5 is considered underweight.  A BMI of 18.5 to 24.9 is normal.  A BMI of 25 to 29.9 is considered overweight.  A BMI of 30 and above is considered obese.  Maintain normal blood lipids and cholesterol by exercising and minimizing your intake of saturated fat. Eat a balanced diet with plenty of fruits and vegetables. Blood tests for lipids and cholesterol should begin at age 55 and be repeated every 5 years. If your lipid or cholesterol levels are high, you are over age 38, or you are at high risk for heart disease, you may need your  cholesterol levels checked more frequently.Ongoing high lipid and cholesterol levels should be treated with medicines if diet and exercise are not working.  If you smoke, find out from your health care provider how to quit. If you do not use tobacco, do not start.  Lung cancer screening is recommended for adults aged 43-80 years who are at high risk for developing lung cancer because of a history of smoking. A yearly low-dose CT scan of the lungs is recommended for people who have at least a 30-pack-year history of smoking and are current smokers or have quit within the past 15 years. A pack year of smoking is smoking an average of 1 pack of cigarettes a day for 1 year (for example, a 30-pack-year history of smoking could mean smoking 1 pack a day for 30 years or 2 packs a day for 15 years). Yearly screening should continue until the smoker has stopped smoking for at least 15 years. Yearly screening should be stopped for people who develop a health problem that would prevent them from having lung cancer treatment.  If you choose to drink alcohol, do not have more than 2 drinks per day. One drink is considered to be 12 oz (360 mL) of beer, 5 oz (150 mL) of wine, or 1.5 oz (45 mL) of liquor.  Avoid the use of street drugs. Do not share needles with anyone. Ask for help if you need support or instructions about stopping the  use of drugs.  High blood pressure causes heart disease and increases the risk of stroke. High blood pressure is more likely to develop in:  People who have blood pressure in the end of the normal range (100-139/85-89 mm Hg).  People who are overweight or obese.  People who are African American.  If you are 74-40 years of age, have your blood pressure checked every 3-5 years. If you are 33 years of age or older, have your blood pressure checked every year. You should have your blood pressure measured twice--once when you are at a hospital or clinic, and once when you are not at a  hospital or clinic. Record the average of the two measurements. To check your blood pressure when you are not at a hospital or clinic, you can use:  An automated blood pressure machine at a pharmacy.  A home blood pressure monitor.  If you are 81-83 years old, ask your health care provider if you should take aspirin to prevent heart disease.  Diabetes screening involves taking a blood sample to check your fasting blood sugar level. This should be done once every 3 years after age 33 if you are at a normal weight and without risk factors for diabetes. Testing should be considered at a younger age or be carried out more frequently if you are overweight and have at least 1 risk factor for diabetes.  Colorectal cancer can be detected and often prevented. Most routine colorectal cancer screening begins at the age of 15 and continues through age 63. However, your health care provider may recommend screening at an earlier age if you have risk factors for colon cancer. On a yearly basis, your health care provider may provide home test kits to check for hidden blood in the stool. A small camera at the end of a tube may be used to directly examine the colon (sigmoidoscopy or colonoscopy) to detect the earliest forms of colorectal cancer. Talk to your health care provider about this at age 45 when routine screening begins. A direct exam of the colon should be repeated every 5-10 years through age 68, unless early forms of precancerous polyps or small growths are found.  People who are at an increased risk for hepatitis B should be screened for this virus. You are considered at high risk for hepatitis B if:  You were born in a country where hepatitis B occurs often. Talk with your health care provider about which countries are considered high risk.  Your parents were born in a high-risk country and you have not received a shot to protect against hepatitis B (hepatitis B vaccine).  You have HIV or AIDS.  You  use needles to inject street drugs.  You live with, or have sex with, someone who has hepatitis B.  You are a man who has sex with other men (MSM).  You get hemodialysis treatment.  You take certain medicines for conditions like cancer, organ transplantation, and autoimmune conditions.  Hepatitis C blood testing is recommended for all people born from [birth year] through 1963 and any individual with known risk factors for hepatitis C.  Healthy men should no longer receive prostate-specific antigen (PSA) blood tests as part of routine cancer screening. Talk to your health care provider about prostate cancer screening.  Testicular cancer screening is not recommended for adolescents or adult males who have no symptoms. Screening includes self-exam, a health care provider exam, and other screening tests. Consult with your health care provider about any symptoms  you have or any concerns you have about testicular cancer.  Practice safe sex. Use condoms and avoid high-risk sexual practices to reduce the spread of sexually transmitted infections (STIs).  You should be screened for STIs, including gonorrhea and chlamydia if:  You are sexually active and are younger than 24 years.  You are older than 24 years, and your health care provider tells you that you are at risk for this type of infection.  Your sexual activity has changed since you were last screened, and you are at an increased risk for chlamydia or gonorrhea. Ask your health care provider if you are at risk.  If you are at risk of being infected with HIV, it is recommended that you take a prescription medicine daily to prevent HIV infection. This is called pre-exposure prophylaxis (PrEP). You are considered at risk if:  You are a man who has sex with other men (MSM).  You are a heterosexual man who is sexually active with multiple partners.  You take drugs by injection.  You are sexually active with a partner who has HIV.  Talk with  your health care provider about whether you are at high risk of being infected with HIV. If you choose to begin PrEP, you should first be tested for HIV. You should then be tested every 3 months for as long as you are taking PrEP.  Use sunscreen. Apply sunscreen liberally and repeatedly throughout the day. You should seek shade when your shadow is shorter than you. Protect yourself by wearing long sleeves, pants, a wide-brimmed hat, and sunglasses year round whenever you are outdoors.  Tell your health care provider of new moles or changes in moles, especially if there is a change in shape or color. Also, tell your health care provider if a mole is larger than the size of a pencil eraser.  A one-time screening for abdominal aortic aneurysm (AAA) and surgical repair of large AAAs by ultrasound is recommended for men aged 17-75 years who are current or former smokers.  Stay current with your vaccines (immunizations).   This information is not intended to replace advice given to you by your health care provider. Make sure you discuss any questions you have with your health care provider.   Document Released: 03/14/2008 Document Revised: 10/07/2014 Document Reviewed: 02/11/2011 Elsevier Interactive Patient Education [employer].

## 2015-12-05 NOTE — Addendum Note (Signed)
Addended by: Colleen Can on: 12/05/2015 10:02 AM   Modules accepted: Orders

## 2016-02-06 ENCOUNTER — Other Ambulatory Visit (INDEPENDENT_AMBULATORY_CARE_PROVIDER_SITE_OTHER): Payer: Medicare HMO

## 2016-02-06 DIAGNOSIS — R972 Elevated prostate specific antigen [PSA]: Secondary | ICD-10-CM

## 2016-02-06 LAB — PSA: PSA: 10.06 ng/mL — AB (ref 0.10–4.00)

## 2016-02-08 ENCOUNTER — Telehealth: Payer: Self-pay | Admitting: Adult Health

## 2016-02-08 MED ORDER — DUTASTERIDE 0.5 MG PO CAPS: 0.5000 mg | ORAL_CAPSULE | Freq: Every day | ORAL | Status: DC

## 2016-02-08 NOTE — Telephone Encounter (Signed)
Pt is returning cory call

## 2016-02-08 NOTE — Telephone Encounter (Signed)
Left VM to call back regarding labs  

## 2016-02-08 NOTE — Telephone Encounter (Signed)
Spoke to patient on the phone and informed him of his PSA tests. He has had elevated PSA in the past, the highest it has been was 18. He has been seen by urology.   He would like to go back on Avodart to see if that decreases PSA.

## 2016-02-08 NOTE — Telephone Encounter (Signed)
See below

## 2016-03-27 ENCOUNTER — Telehealth: Payer: Self-pay | Admitting: Adult Health

## 2016-03-27 ENCOUNTER — Ambulatory Visit (INDEPENDENT_AMBULATORY_CARE_PROVIDER_SITE_OTHER): Payer: Medicare HMO | Admitting: Adult Health

## 2016-03-27 ENCOUNTER — Encounter: Payer: Self-pay | Admitting: Adult Health

## 2016-03-27 VITALS — BP 138/64 | Temp 98.4°F | Wt 201.1 lb

## 2016-03-27 DIAGNOSIS — R972 Elevated prostate specific antigen [PSA]: Secondary | ICD-10-CM

## 2016-03-27 DIAGNOSIS — R197 Diarrhea, unspecified: Secondary | ICD-10-CM

## 2016-03-27 MED ORDER — DICYCLOMINE HCL 10 MG PO CAPS: 10.0000 mg | ORAL_CAPSULE | Freq: Three times a day (TID) | ORAL | Status: DC

## 2016-03-27 NOTE — Progress Notes (Signed)
Subjective:    Patient ID: Shane [name], male    DOB: 1932-07-22, 82 y.o.   MRN: [id number]  HPI  82 year old male who presents to the office today for the acute complaint of  " loose bowel movements". He reports that over the last 72 hours he has had loose bowel movements. He has had 1-2 bowel movements per day, which is normal for him. He continues to play golf and does his activities of daily living.  He denies any acute abdominal pain but has a "discomfort, like a bloating feeling."  He has been using Imodium which has helped slightly but that as soon as he stops taking it, the loose bowel movements return.   No n/v/fevers/rashes/bug bites, body aches.   No blood in the stool   He has not changed medications, been on antibiotics recently or traveled outside of the [place].   Additionally, he would also like to check his PSA level he has a history of elevated PSAs in the last time we checked about a month ago PSA was 10.06,  and we restarted him on Avodart.  Review of Systems  Constitutional: Negative.   Respiratory: Negative.   Cardiovascular: Negative.   Gastrointestinal: Positive for abdominal pain and diarrhea. Negative for nausea, vomiting, constipation, blood in stool, abdominal distention and rectal pain.  Genitourinary: Negative.   Neurological: Negative.   All other systems reviewed and are negative.  Past Medical History  Diagnosis Date  . Macular degeneration   . Elevated PSA   . Spinal stenosis   . DDD (degenerative disc disease), cervical   . Gout   . Hiatal hernia   . GERD (gastroesophageal reflux disease)   . Extrinsic asthma   . Essential hypertension   . Anemia, iron deficiency   . Dysplastic nevus of left upper extremity     Social History   Social History  . Marital Status: Married    Spouse Name: N/A  . Number of Children: N/A  . Years of Education: N/A   Occupational History  . Not on file.   Social History Main Topics  . Smoking status:  Former Research scientist (life sciences)  . Smokeless tobacco: Not on file  . Alcohol Use: No  . Drug Use: No  . Sexual Activity: Not on file   Other Topics Concern  . Not on file   Social History Narrative   Retired from Psychologist, educational    Married for 55 years    Three children all live locally    [place] to play golf and go to [employer].     Past Surgical History  Procedure Laterality Date  . Skin cancer excision    . Back surgery    . Bone spur Right     Shoulder    Family History  Problem Relation Age of Onset  . Multiple myeloma Brother   . Prostate cancer Brother   . Hypertension Father     ?    No Known Allergies  Current Outpatient Prescriptions on File Prior to Visit  Medication Sig Dispense Refill  . allopurinol (ZYLOPRIM) 300 MG tablet TAKE 1 TABLET (300 MG TOTAL) BY MOUTH DAILY. 100 tablet 0  . aspirin 81 MG EC tablet Take 81 mg by mouth daily.      Marland Kitchen atenolol-chlorthalidone (TENORETIC) 50-25 MG tablet Take 0.5 tablets by mouth daily. 50 tablet 3  . clobetasol (TEMOVATE) 0.05 % external solution APPLY TO SCALP TWICE A DAY AS NEEDED FOR FLARES  11  .  dutasteride (AVODART) 0.5 MG capsule Take 1 capsule (0.5 mg total) by mouth daily. 90 capsule 1  . Glucosamine 500 MG CAPS Take by mouth.      Marland Kitchen ibuprofen (ADVIL,MOTRIN) 600 MG tablet     . loratadine (CLARITIN) 10 MG tablet Take 10 mg by mouth daily. Reported on 10/11/2015     No current facility-administered medications on file prior to visit.    BP 138/64 mmHg  Temp(Src) 98.4 F (36.9 C) (Oral)  Wt 201 lb 1.6 oz (91.218 kg)       Objective:   Physical Exam  Constitutional: He is oriented to person, place, and time. He appears well-developed and well-nourished. No distress.  Cardiovascular: Normal rate, regular rhythm, normal heart sounds and intact distal pulses.  Exam reveals no gallop.   No murmur heard. Pulmonary/Chest: Effort normal and breath sounds normal. No respiratory distress. He has no wheezes. He has no rales. He  exhibits no tenderness.  Abdominal: Soft. Bowel sounds are normal. He exhibits no distension and no mass. There is no tenderness. There is no rebound and no guarding.  Neurological: He is alert and oriented to person, place, and time.  Skin: Skin is warm and dry. No rash noted. He is not diaphoretic. No erythema. No pallor.  Psychiatric: He has a normal mood and affect. His behavior is normal. Judgment and thought content normal.  Nursing note and vitals reviewed.      Assessment & Plan:  1. Diarrhea, unspecified type - Likely infectious diarrhea, likely GI bug. -DC Imodium - dicyclomine (BENTYL) 10 MG capsule; Take 1 capsule (10 mg total) by mouth 3 (three) times daily before meals.  Dispense: 30 capsule; Refill: 1. Take for 3 days  - Advised foods to eat for GI rest - Staywell hydrated - Follow-up if no improvement after 3 days of taking that 2. Elevated PSA - PSA - Consider referral back to urology  Dorothyann Peng, NP

## 2016-03-27 NOTE — Patient Instructions (Signed)
Take Bently 3 times a day for the next 3 days.   Pick up a Pro Biotic at the pharmacy.   I will call you for your PSA results

## 2016-03-27 NOTE — Telephone Encounter (Signed)
Noted  

## 2016-03-27 NOTE — Telephone Encounter (Signed)
Patient Name: Shane [name]  DOB: [birth year]    Initial Comment Pt is having problems with diarrhea- medication helped for a bit   Nurse Assessment  Nurse: Mallie Mussel, RN, Alveta Heimlich Date/Time Eilene Ghazi Time): 03/27/2016 9:24:43 AM  Confirm and document reason for call. If symptomatic, describe symptoms. You must click the next button to save text entered. ---Caller states that he began having diarrhea on last Sunday evening. He had been taking some Immodium and it helped some, but it came right back. He has had diarrhea x 3 in the last 24 hours. He last urinated 20 minutes ago. He is still able to stand and walk around. 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 He last played Monday.  Has the patient traveled out of the country within the last 30 days? ---No  Does the patient have any new or worsening symptoms? ---Yes  Will a triage be completed? ---Yes  Related visit to physician within the last 2 weeks? ---No  Does the PT have any chronic conditions? (i.e. diabetes, asthma, etc.) ---Yes  List chronic conditions. ---HTN, PSA  Is this a behavioral health or substance abuse call? ---No     Guidelines    Guideline Title Affirmed Question Affirmed Notes  Diarrhea [1] MILD diarrhea (e.g., 1-3 or more stools than normal in past 24 hours) without known cause AND [2] present > 7 days    Final Disposition User   See PCP When Office is Open (within 3 days) Mallie Mussel, RN, Alveta Heimlich    Referrals  REFERRED TO PCP OFFICE   Disagree/Comply: Leta Baptist

## 2016-03-27 NOTE — Telephone Encounter (Signed)
FYI: Pt on your schedule for today

## 2016-03-28 LAB — PSA: PSA: 6.43 ng/mL — AB (ref 0.10–4.00)

## 2016-04-09 ENCOUNTER — Ambulatory Visit (INDEPENDENT_AMBULATORY_CARE_PROVIDER_SITE_OTHER): Payer: Medicare HMO | Admitting: Adult Health

## 2016-04-09 ENCOUNTER — Encounter: Payer: Self-pay | Admitting: Adult Health

## 2016-04-09 ENCOUNTER — Encounter: Payer: Self-pay | Admitting: Gastroenterology

## 2016-04-09 ENCOUNTER — Telehealth: Payer: Self-pay | Admitting: Gastroenterology

## 2016-04-09 VITALS — BP 110/64 | HR 56 | Temp 98.6°F | Wt 196.0 lb

## 2016-04-09 DIAGNOSIS — R197 Diarrhea, unspecified: Secondary | ICD-10-CM

## 2016-04-09 NOTE — Patient Instructions (Signed)
I am sorry you are still going through this.   Continue with the Pro Biotics and Bentyl. Add Metamucil    Someone from GI will call to schedule your appointment.   Please let me know if you need anything

## 2016-04-09 NOTE — Progress Notes (Signed)
 Subjective:    Patient ID: Shane [name], male    DOB: 12/15/1931, 83 y.o.   MRN: [id number]  HPI  83 year old male who  has a past medical history of Macular degeneration; Elevated PSA; Spinal stenosis; DDD (degenerative disc disease), cervical; Gout; Hiatal hernia; GERD (gastroesophageal reflux disease); Extrinsic asthma; Essential hypertension; Anemia, iron deficiency; and Dysplastic nevus of left upper extremity. He presents today for continued diarrhea. I last saw him about two weeks ago for the same complaint.   He reports that his bowel movements have been " better".  He was prescribed Bentyl, which " has helped". He has also been taking Pro Biotics daily. He does report " There are days when I feel fine but then it all comes back. I was on the golf course his weekend and got to the last three holes when my stomach started cramping and then the diarrhea came back. Then a couple of days later I had more formed stools."  His BM this morning was more formed but still " not normal."   He denies any blood in stool or rectal pain. No nausea, vomiting, or fevers. He is not feeling acutely ill   No pain in abdomen except for cramping on occassion    Review of Systems  Respiratory: Negative.   Cardiovascular: Negative.   Gastrointestinal: Positive for diarrhea. Negative for nausea, vomiting, abdominal pain, constipation, blood in stool, abdominal distention, anal bleeding and rectal pain.  Musculoskeletal: Negative.   Neurological: Negative.   All other systems reviewed and are negative.  Past Medical History  Diagnosis Date  . Macular degeneration   . Elevated PSA   . Spinal stenosis   . DDD (degenerative disc disease), cervical   . Gout   . Hiatal hernia   . GERD (gastroesophageal reflux disease)   . Extrinsic asthma   . Essential hypertension   . Anemia, iron deficiency   . Dysplastic nevus of left upper extremity     Social History   Social History  . Marital Status:  Married    Spouse Name: N/A  . Number of Children: N/A  . Years of Education: N/A   Occupational History  . Not on file.   Social History Main Topics  . Smoking status: Former Smoker  . Smokeless tobacco: Not on file  . Alcohol Use: No  . Drug Use: No  . Sexual Activity: Not on file   Other Topics Concern  . Not on file   Social History Narrative   Retired from manufacturing    Married for 55 years    Three children all live locally    Likes to play golf and go to the beach.     Past Surgical History  Procedure Laterality Date  . Skin cancer excision    . Back surgery    . Bone spur Right     Shoulder    Family History  Problem Relation Age of Onset  . Multiple myeloma Brother   . Prostate cancer Brother   . Hypertension Father     ?    No Known Allergies  Current Outpatient Prescriptions on File Prior to Visit  Medication Sig Dispense Refill  . allopurinol (ZYLOPRIM) 300 MG tablet TAKE 1 TABLET (300 MG TOTAL) BY MOUTH DAILY. 100 tablet 0  . aspirin 81 MG EC tablet Take 81 mg by mouth daily.      . atenolol-chlorthalidone (TENORETIC) 50-25 MG tablet Take 0.5 tablets by mouth daily. 50   tablet 3  . clobetasol (TEMOVATE) 0.05 % external solution APPLY TO SCALP TWICE A DAY AS NEEDED FOR FLARES  11  . dicyclomine (BENTYL) 10 MG capsule Take 1 capsule (10 mg total) by mouth 3 (three) times daily before meals. 30 capsule 1  . dutasteride (AVODART) 0.5 MG capsule Take 1 capsule (0.5 mg total) by mouth daily. 90 capsule 1  . Glucosamine 500 MG CAPS Take by mouth.      . ibuprofen (ADVIL,MOTRIN) 600 MG tablet     . loratadine (CLARITIN) 10 MG tablet Take 10 mg by mouth daily. Reported on 10/11/2015    . Multiple Vitamins-Minerals (PRESERVISION AREDS) TABS Take by mouth 2 (two) times daily.     No current facility-administered medications on file prior to visit.    There were no vitals taken for this visit.       Objective:   Physical Exam  Constitutional: He is  oriented to person, place, and time. He appears well-developed and well-nourished. No distress.  Cardiovascular: Normal rate, regular rhythm, normal heart sounds and intact distal pulses.  Exam reveals no gallop and no friction rub.   No murmur heard. Pulmonary/Chest: Effort normal and breath sounds normal. No respiratory distress. He has no wheezes. He has no rales. He exhibits no tenderness.  Abdominal: Soft. Bowel sounds are normal. He exhibits no distension and no mass. There is no tenderness. There is no rebound and no guarding.  Neurological: He is alert and oriented to person, place, and time.  Skin: Skin is warm and dry. No rash noted. He is not diaphoretic. No erythema. No pallor.  Psychiatric: He has a normal mood and affect. His behavior is normal. Judgment and thought content normal.  Nursing note and vitals reviewed.     Assessment & Plan:  1. Diarrhea, unspecified type - Doubt infectious diarrhea but will get stool culture to make sure due to time frame - Stool culture - Ambulatory referral to Gastroenterology - Continue with Bentyl PRN, Bentyl, and add Metamucil  - Bland foods - Stay hydrated   , NP  

## 2016-04-10 NOTE — Telephone Encounter (Signed)
Saw PCP and stool studies were ordered, not resulted as of today.  He also states he does feel better today and will wait for stool results and recommendations from PCP.  He will keep appt as scheduled for now

## 2016-04-11 ENCOUNTER — Other Ambulatory Visit: Payer: Self-pay | Admitting: Adult Health

## 2016-04-11 MED ORDER — METRONIDAZOLE 500 MG PO TABS: 500.0000 mg | ORAL_TABLET | Freq: Three times a day (TID) | ORAL | Status: DC

## 2016-04-11 MED ORDER — CIPROFLOXACIN HCL 500 MG PO TABS: 500.0000 mg | ORAL_TABLET | Freq: Two times a day (BID) | ORAL | Status: DC

## 2016-04-11 NOTE — Telephone Encounter (Signed)
Spoke with Shane [name] on the phone. His stool culture did not grow anything. He has had three bouts of diarrhea this morning alone. His scheduled appointment with GI is not until September.   I am going to treat him with 5 days of Cipro 500mg  BID and Flagyl 500mg  TID.   Continue with probiotics.   Follow up if no improvement

## 2016-04-13 LAB — STOOL CULTURE

## 2016-04-15 ENCOUNTER — Telehealth: Payer: Self-pay | Admitting: Adult Health

## 2016-04-15 NOTE — Telephone Encounter (Signed)
FYI pt is calling to let md know the abx is working

## 2016-04-16 NOTE — Telephone Encounter (Signed)
Will route to Cory as FYI. 

## 2016-04-25 ENCOUNTER — Telehealth: Payer: Self-pay | Admitting: Adult Health

## 2016-04-25 ENCOUNTER — Ambulatory Visit (INDEPENDENT_AMBULATORY_CARE_PROVIDER_SITE_OTHER): Payer: Medicare HMO | Admitting: Adult Health

## 2016-04-25 ENCOUNTER — Encounter: Payer: Self-pay | Admitting: Adult Health

## 2016-04-25 ENCOUNTER — Other Ambulatory Visit: Payer: Self-pay | Admitting: Adult Health

## 2016-04-25 VITALS — BP 104/56 | Temp 97.6°F | Wt 193.6 lb

## 2016-04-25 DIAGNOSIS — K591 Functional diarrhea: Secondary | ICD-10-CM

## 2016-04-25 MED ORDER — METRONIDAZOLE 500 MG PO TABS: 500.0000 mg | ORAL_TABLET | Freq: Three times a day (TID) | ORAL | 0 refills | Status: DC

## 2016-04-25 MED ORDER — HYOSCYAMINE SULFATE 0.125 MG PO TABS: 0.1250 mg | ORAL_TABLET | ORAL | 0 refills | Status: DC | PRN

## 2016-04-25 MED ORDER — CIPROFLOXACIN HCL 500 MG PO TABS: 500.0000 mg | ORAL_TABLET | Freq: Two times a day (BID) | ORAL | 0 refills | Status: DC

## 2016-04-25 NOTE — Patient Instructions (Addendum)
I have sent in a new prescription for antibiotics  Stop by the lab and have your blood work done.   Do not start the antibiotics until you have given a stool sample.   I have also sent in a prescription for hyoscyamine

## 2016-04-25 NOTE — Progress Notes (Signed)
Subjective:    Patient ID: Shane [name], male    DOB: 01-21-1932, 82 y.o.   MRN: [id number]  HPI  82 year old male who presents to the office today for continued diarrhea. He was put on Cipro and Flagyl and finished those 11 days ago. He reports that when he was on the antibiotics that his diarrhea " eased off and I was having formed stool." Three or four days after finishing the antibiotics he noticed that his stools were starting to go back to being watery. As the days progressed his stool become more and more watery. Today after he got off the golf course his stool was " all water". He denies any changes in color or smell  His diet has not changed.   He denies any abdominal pain besides before he has a BM, he reports slight cramping.     Review of Systems  Respiratory: Negative.   Cardiovascular: Negative.   Gastrointestinal: Positive for diarrhea. Negative for abdominal distention, abdominal pain, blood in stool, constipation, nausea, rectal pain and vomiting.  Genitourinary: Negative.   Musculoskeletal: Negative.   All other systems reviewed and are negative.  Past Medical History:  Diagnosis Date  . Anemia, iron deficiency   . DDD (degenerative disc disease), cervical   . Dysplastic nevus of left upper extremity   . Elevated PSA   . Essential hypertension   . Extrinsic asthma   . GERD (gastroesophageal reflux disease)   . Gout   . Hiatal hernia   . Macular degeneration   . Spinal stenosis     Social History   Social History  . Marital status: Married    Spouse name: N/A  . Number of children: N/A  . Years of education: N/A   Occupational History  . Not on file.   Social History Main Topics  . Smoking status: Former Research scientist (life sciences)  . Smokeless tobacco: Not on file  . Alcohol use No  . Drug use: No  . Sexual activity: Not on file   Other Topics Concern  . Not on file   Social History Narrative   Retired from Psychologist, educational    Married for 55 years    Three  children all live locally    [place] to play golf and go to [employer].     Past Surgical History:  Procedure Laterality Date  . BACK SURGERY    . Bone Spur Right    Shoulder  . SKIN CANCER EXCISION      Family History  Problem Relation Age of Onset  . Multiple myeloma Brother   . Prostate cancer Brother   . Hypertension Father     ?    No Known Allergies  Current Outpatient Prescriptions on File Prior to Visit  Medication Sig Dispense Refill  . allopurinol (ZYLOPRIM) 300 MG tablet TAKE 1 TABLET (300 MG TOTAL) BY MOUTH DAILY. 100 tablet 0  . aspirin 81 MG EC tablet Take 81 mg by mouth daily.      Marland Kitchen atenolol-chlorthalidone (TENORETIC) 50-25 MG tablet Take 0.5 tablets by mouth daily. 50 tablet 3  . clobetasol (TEMOVATE) 0.05 % external solution APPLY TO SCALP TWICE A DAY AS NEEDED FOR FLARES  11  . dutasteride (AVODART) 0.5 MG capsule Take 1 capsule (0.5 mg total) by mouth daily. 90 capsule 1  . Glucosamine 500 MG CAPS Take by mouth.      Marland Kitchen ibuprofen (ADVIL,MOTRIN) 600 MG tablet     . loratadine (CLARITIN) 10  MG tablet Take 10 mg by mouth daily. Reported on 10/11/2015    . Multiple Vitamins-Minerals (PRESERVISION AREDS) TABS Take by mouth 2 (two) times daily.     No current facility-administered medications on file prior to visit.     BP (!) 104/56 (BP Location: Left Arm, Patient Position: Sitting)   Temp 97.6 F (36.4 C) (Oral)   Wt 193 lb 9.6 oz (87.8 kg)   BMI 27.78 kg/m       Objective:   Physical Exam  Constitutional: He is oriented to person, place, and time. He appears well-developed and well-nourished. No distress.  Cardiovascular: Normal rate, regular rhythm, normal heart sounds and intact distal pulses.  Exam reveals no gallop and no friction rub.   No murmur heard. Pulmonary/Chest: Effort normal and breath sounds normal. No respiratory distress. He has no wheezes. He has no rales. He exhibits no tenderness.  Abdominal: Soft. Bowel sounds are normal. He  exhibits no distension and no mass. There is no tenderness. There is no rebound and no guarding.  Neurological: He is alert and oriented to person, place, and time.  Skin: Skin is warm and dry. No rash noted. He is not diaphoretic. No erythema. No pallor.  Psychiatric: He has a normal mood and affect. His behavior is normal. Judgment and thought content normal.  Nursing note and vitals reviewed.     Assessment & Plan:  1. Functional diarrhea - Parasite? IBS-D? Abscess? I dount malignancy or diverticulitis.  - CT Abdomen Pelvis W Contrast; Future - Basic metabolic panel - C. difficile, PCR - Ova and Parasite Exam - ciprofloxacin (CIPRO) 500 MG tablet; Take 1 tablet (500 mg total) by mouth 2 (two) times daily.  Dispense: 10 tablet; Refill: 0 - metroNIDAZOLE (FLAGYL) 500 MG tablet; Take 1 tablet (500 mg total) by mouth 3 (three) times daily.  Dispense: 15 tablet; Refill: 0 - Fecal leukocytes - Advised not to start antibiotics until after the read on CT and not until he has given a stool sample - Restart probiotics - Stay hydrated - Can add Metamucil.  - Follow up as needed  Dorothyann Peng, NP

## 2016-04-25 NOTE — Telephone Encounter (Signed)
[facility] - Client [facility] Patient Name: Shane [name] DOB: [birth year] Initial Comment Caller states he was treated for Diarrhea, was put on antibiotics, cleared up. Diarrhea is coming back again. Nurse Assessment Nurse: Dimas Chyle, RN, Dellis Filbert Date/Time Eilene Ghazi Time): 04/25/2016 1:40:34 PM Confirm and document reason for call. If symptomatic, describe symptoms. You must click the next button to save text entered. ---Caller states he was treated for diarrhea, was put on antibiotics, cleared up. Diarrhea is coming back again. Completed antibiotics on 04/14/16. Diarrhea has gotten worse yesterday. No fever. Has the patient traveled out of the country within the last 30 days? ---No Does the patient have any new or worsening symptoms? ---Yes Will a triage be completed? ---Yes Related visit to physician within the last 2 weeks? ---No Does the PT have any chronic conditions? (i.e. diabetes, asthma, etc.) ---Yes List chronic conditions. ---HTN Is this a behavioral health or substance abuse call? ---No Guidelines Guideline Title Affirmed Question Affirmed Notes Diarrhea [1] MILD (e.g., 1-3 or more stools than normal in past 24 hours) diarrhea without known cause AND [2] present > 7 days Final Disposition User See PCP When Office is Open (within 3 days) Dimas Chyle, RN, [employer] Referrals REFERRED TO PCP OFFICE Disagree/Comply: Leta Baptist

## 2016-04-26 ENCOUNTER — Other Ambulatory Visit: Payer: Self-pay | Admitting: Adult Health

## 2016-04-26 ENCOUNTER — Telehealth: Payer: Self-pay | Admitting: Adult Health

## 2016-04-26 ENCOUNTER — Ambulatory Visit (INDEPENDENT_AMBULATORY_CARE_PROVIDER_SITE_OTHER)
Admission: RE | Admit: 2016-04-26 | Discharge: 2016-04-26 | Disposition: A | Discharge status: Home / Self Care | Payer: Medicare HMO | Source: Ambulatory Visit | Attending: Adult Health | Admitting: Adult Health

## 2016-04-26 DIAGNOSIS — K591 Functional diarrhea: Secondary | ICD-10-CM

## 2016-04-26 DIAGNOSIS — R197 Diarrhea, unspecified: Secondary | ICD-10-CM

## 2016-04-26 DIAGNOSIS — N400 Enlarged prostate without lower urinary tract symptoms: Secondary | ICD-10-CM | POA: Diagnosis not present

## 2016-04-26 LAB — BASIC METABOLIC PANEL
BUN: 44 mg/dL — AB (ref 6–23)
CO2: 21 meq/L (ref 19–32)
Calcium: 9.3 mg/dL (ref 8.4–10.5)
Chloride: 109 mEq/L (ref 96–112)
Creatinine, Ser: 2.23 mg/dL — ABNORMAL HIGH (ref 0.40–1.50)
GFR: 30.15 mL/min — ABNORMAL LOW (ref >60.00)
GLUCOSE: 84 mg/dL (ref 70–99)
POTASSIUM: 5 meq/L (ref 3.5–5.1)
SODIUM: 139 meq/L (ref 135–145)

## 2016-04-26 NOTE — Telephone Encounter (Signed)
Spoke to [place] and informed him of his labs and CT scan.   CT scan was negative  His BMP did show an elevated Cr and BMP - likely due to dehydration. Advised to drink plenty of fluids throughout the weekend.

## 2016-04-27 LAB — CLOSTRIDIUM DIFFICILE BY PCR: Toxigenic C. Difficile by PCR: NOT DETECTED

## 2016-04-29 LAB — OVA AND PARASITE EXAMINATION: OP: NONE SEEN

## 2016-05-02 ENCOUNTER — Other Ambulatory Visit: Payer: Self-pay | Admitting: Adult Health

## 2016-05-19 ENCOUNTER — Emergency Department (HOSPITAL_COMMUNITY)
Admission: EM | Admit: 2016-05-19 | Discharge: 2016-05-19 | Disposition: A | Discharge status: Home / Self Care | Payer: Medicare HMO | Attending: Emergency Medicine | Admitting: Emergency Medicine

## 2016-05-19 ENCOUNTER — Encounter (HOSPITAL_COMMUNITY): Payer: Self-pay | Admitting: Emergency Medicine

## 2016-05-19 DIAGNOSIS — Z79899 Other long term (current) drug therapy: Secondary | ICD-10-CM | POA: Diagnosis not present

## 2016-05-19 DIAGNOSIS — J45909 Unspecified asthma, uncomplicated: Secondary | ICD-10-CM | POA: Diagnosis not present

## 2016-05-19 DIAGNOSIS — Z87891 Personal history of nicotine dependence: Secondary | ICD-10-CM | POA: Insufficient documentation

## 2016-05-19 DIAGNOSIS — R197 Diarrhea, unspecified: Secondary | ICD-10-CM

## 2016-05-19 DIAGNOSIS — I10 Essential (primary) hypertension: Secondary | ICD-10-CM | POA: Diagnosis not present

## 2016-05-19 DIAGNOSIS — Z7982 Long term (current) use of aspirin: Secondary | ICD-10-CM | POA: Insufficient documentation

## 2016-05-19 LAB — CBC
HEMATOCRIT: 41.4 % (ref 39.0–52.0)
HEMOGLOBIN: 13.4 g/dL (ref 13.0–17.0)
MCH: 30.4 pg (ref 26.0–34.0)
MCHC: 32.4 g/dL (ref 30.0–36.0)
MCV: 93.9 fL (ref 78.0–100.0)
PLATELETS: 157 10*3/uL (ref 150–400)
RBC: 4.41 MIL/uL (ref 4.22–5.81)
RDW: 14.1 % (ref 11.5–15.5)
WBC: 6 10*3/uL (ref 4.0–10.5)

## 2016-05-19 LAB — COMPREHENSIVE METABOLIC PANEL
ALT: 16 U/L — AB (ref 17–63)
AST: 24 U/L (ref 15–41)
Albumin: 3.6 g/dL (ref 3.5–5.0)
Alkaline Phosphatase: 27 U/L — ABNORMAL LOW (ref 38–126)
Anion gap: 7 (ref 5–15)
BUN: 30 mg/dL — ABNORMAL HIGH (ref 6–20)
CHLORIDE: 111 mmol/L (ref 101–111)
CO2: 17 mmol/L — AB (ref 22–32)
CREATININE: 1.85 mg/dL — AB (ref 0.61–1.24)
Calcium: 9 mg/dL (ref 8.9–10.3)
GFR calc non Af Amer: 33 mL/min — ABNORMAL LOW (ref >60)
GFR, EST AFRICAN AMERICAN: 38 mL/min — AB (ref >60)
Glucose, Bld: 113 mg/dL — ABNORMAL HIGH (ref 65–99)
POTASSIUM: 4 mmol/L (ref 3.5–5.1)
SODIUM: 135 mmol/L (ref 135–145)
Total Bilirubin: 1 mg/dL (ref 0.3–1.2)
Total Protein: 6.6 g/dL (ref 6.5–8.1)

## 2016-05-19 LAB — LIPASE, BLOOD: LIPASE: 31 U/L (ref 11–51)

## 2016-05-19 MED ORDER — ONDANSETRON HCL 4 MG/2ML IJ SOLN
4.0000 mg | Freq: Once | INTRAMUSCULAR | Status: AC
  Administered 2016-05-19: 4 mg via INTRAVENOUS
  Filled 2016-05-19: qty 2

## 2016-05-19 MED ORDER — DICYCLOMINE HCL 20 MG PO TABS: 20.0000 mg | ORAL_TABLET | Freq: Three times a day (TID) | ORAL | 0 refills | Status: DC

## 2016-05-19 MED ORDER — DICYCLOMINE HCL 10 MG PO CAPS
10.0000 mg | ORAL_CAPSULE | Freq: Once | ORAL | Status: AC
  Administered 2016-05-19: 10 mg via ORAL
  Filled 2016-05-19: qty 1

## 2016-05-19 MED ORDER — SODIUM CHLORIDE 0.9 % IV BOLUS (SEPSIS)
1000.0000 mL | Freq: Once | INTRAVENOUS | Status: AC
  Administered 2016-05-19: 1000 mL via INTRAVENOUS

## 2016-05-19 NOTE — ED Provider Notes (Signed)
[facility]-EMERGENCY DEPT Provider Note   CSN: [id number] Arrival date & time: 05/19/16  0800     History   Chief Complaint Chief Complaint  Patient presents with  . Abdominal Pain  . Diarrhea    HPI Shane [name] is a 82 y.o. male.  HPI   Off and on diarrhea for 2 months, end of June, has had work up with PCP, including stool studies, CT abdomen.  Yesterday AM 5AM began to have diarrhea, 4 times before noon, took immodium and thinks it helped. Once last night it came back, once 7 oclock. Liquid, watery diarrhea.  No black or bloody stool.  Has some cramping abdominal pain, throw middle of abdomen and worsens with diarrhea in lower part of abdomen.      82-year-old male with a history of hypertension, elevated PSA, asthma, macular degeneration, iron deficiency anemia, hypertriglyceridemia presents with concern for diarrhea for two months. Patient has been undergoing outpatient evaluation, including stool studies with negative C. Diff, CT with gallstones however no other acute findings, studies of ova and parasites. He has also tested his well water.   Past Medical History:  Diagnosis Date  . Anemia, iron deficiency   . DDD (degenerative disc disease), cervical   . Dysplastic nevus of left upper extremity   . Elevated PSA   . Essential hypertension   . Extrinsic asthma   . GERD (gastroesophageal reflux disease)   . Gout   . Hiatal hernia   . Macular degeneration   . Spinal stenosis     Patient Active Problem List   Diagnosis Date Noted  . Hypertriglyceridemia 12/01/2015  . Actinic keratoses 07/19/2015  . Right tennis elbow 01/10/2014  . Dysplastic nevus of left upper extremity 03/22/2013  . Gout of big toe 10/15/2012  . EXTRINSIC ASTHMA, UNSPECIFIED 01/11/2009  . SPINAL STENOSIS 05/24/2008  . PROSTATE SPECIFIC ANTIGEN, ELEVATED 05/24/2008  . OBESITY 08/13/2006  . ANEMIA-IRON DEFICIENCY 08/13/2006  . Essential hypertension 08/13/2006  . ALLERGIC RHINITIS 08/13/2006  .  GERD 08/13/2006  . HIATAL HERNIA 08/13/2006  . DEGENERATIVE JOINT DISEASE 08/13/2006    Past Surgical History:  Procedure Laterality Date  . BACK SURGERY    . Bone Spur Right    Shoulder  . SKIN CANCER EXCISION         Home Medications    Prior to Admission medications   Medication Sig Start Date End Date Taking? Authorizing Provider  allopurinol (ZYLOPRIM) 300 MG tablet TAKE 1 TABLET (300 MG TOTAL) BY MOUTH DAILY. 09/18/15  Yes Jeffrey A Todd, MD  aspirin 81 MG EC tablet Take 81 mg by mouth daily.     Yes Historical Provider, MD  atenolol-chlorthalidone (TENORETIC) 50-25 MG tablet Take 0.5 tablets by mouth daily. 07/19/15  Yes Jeffrey A Todd, MD  clobetasol (TEMOVATE) 0.05 % external solution APPLY TO SCALP TWICE A DAY AS NEEDED FOR FLARES 09/08/15  Yes Historical Provider, MD  dutasteride (AVODART) 0.5 MG capsule Take 1 capsule (0.5 mg total) by mouth daily. 02/08/16  Yes Cory Nafziger, NP  Glucosamine 500 MG CAPS Take 1 capsule by mouth 2 (two) times daily.    Yes Historical Provider, MD  ibuprofen (ADVIL,MOTRIN) 200 MG tablet Take 400 mg by mouth every 8 (eight) hours as needed for mild pain or moderate pain (Takes before playing golf).   Yes Historical Provider, MD  loratadine (CLARITIN) 10 MG tablet Take 10 mg by mouth daily as needed for allergies. Reported on 10/11/2015   Yes Historical Provider,   MD  Multiple Vitamins-Minerals (PRESERVISION AREDS) TABS Take by mouth 2 (two) times daily.   Yes Historical Provider, MD  dicyclomine (BENTYL) 20 MG tablet Take 1 tablet (20 mg total) by mouth 3 (three) times daily before meals. 05/19/16    , MD    Family History Family History  Problem Relation Age of Onset  . Multiple myeloma Brother   . Prostate cancer Brother   . Hypertension Father     ?    Social History Social History  Substance Use Topics  . Smoking status: Former Smoker  . Smokeless tobacco: Never Used  . Alcohol use No     Allergies   Review of  patient's allergies indicates no known allergies.   Review of Systems Review of Systems  Constitutional: Negative for fever.  HENT: Negative for sore throat.   Eyes: Negative for visual disturbance.  Respiratory: Negative for shortness of breath.   Cardiovascular: Negative for chest pain.  Gastrointestinal: Positive for abdominal pain, diarrhea and nausea. Negative for blood in stool and vomiting.  Genitourinary: Negative for difficulty urinating.  Musculoskeletal: Negative for back pain and neck stiffness.  Skin: Negative for rash.  Neurological: Negative for syncope and headaches.     Physical Exam Updated Vital Signs BP 127/74   Pulse 60   Temp 98.3 F (36.8 C) (Oral)   Resp 16   Ht 5' 10.5" (1.791 m)   Wt 193 lb (87.5 kg)   SpO2 98%   BMI 27.30 kg/m   Physical Exam  Constitutional: He is oriented to person, place, and time. He appears well-developed and well-nourished. No distress.  HENT:  Head: Normocephalic and atraumatic.  Eyes: Conjunctivae and EOM are normal.  Neck: Normal range of motion.  Cardiovascular: Normal rate, regular rhythm, normal heart sounds and intact distal pulses.  Exam reveals no gallop and no friction rub.   No murmur heard. Pulmonary/Chest: Effort normal and breath sounds normal. No respiratory distress. He has no wheezes. He has no rales.  Abdominal: Soft. He exhibits no distension. There is tenderness (mild diffuse). There is no guarding, no CVA tenderness, no tenderness at McBurney's point and negative Murphy's sign.  Musculoskeletal: He exhibits no edema.  Neurological: He is alert and oriented to person, place, and time.  Skin: Skin is warm and dry. He is not diaphoretic.  Nursing note and vitals reviewed.    ED Treatments / Results  Labs (all labs ordered are listed, but only abnormal results are displayed) Labs Reviewed  COMPREHENSIVE METABOLIC PANEL - Abnormal; Notable for the following:       Result Value   CO2 17 (*)     Glucose, Bld 113 (*)    BUN 30 (*)    Creatinine, Ser 1.85 (*)    ALT 16 (*)    Alkaline Phosphatase 27 (*)    GFR calc non Af Amer 33 (*)    GFR calc Af Amer 38 (*)    All other components within normal limits  LIPASE, BLOOD  CBC    EKG  EKG Interpretation None       Radiology No results found.  Procedures Procedures (including critical care time)  Medications Ordered in ED Medications  ondansetron (ZOFRAN) injection 4 mg (4 mg Intravenous Given 05/19/16 0847)  sodium chloride 0.9 % bolus 1,000 mL (0 mLs Intravenous Stopped 05/19/16 1000)  dicyclomine (BENTYL) capsule 10 mg (10 mg Oral Given 05/19/16 1019)     Initial Impression / Assessment and Plan / ED Course    I have reviewed the triage vital signs and the nursing notes.  Pertinent labs & imaging results that were available during my care of the patient were reviewed by me and considered in my medical decision making (see chart for details).  Clinical Course   82 year old male with a history of hypertension, elevated PSA, asthma, macular degeneration, iron deficiency anemia, hypertriglyceridemia presents with concern for diarrhea for two months. Patient has been undergoing outpatient evaluation, including stool studies with negative C. Diff, CT with gallstones however no other acute findings, studies of ova and parasites. He has also tested his well water. Patients abdominal exam today is benign, and in light of recent CT scan when patient was having symptoms, and current abdominal exam,have low suspicion for appendicitis, diverticulitis, cholecystitis.  Given findings of cholelithiasis on prior CT, did discuss reasons to return to the emergency department for gallbladder pathology in detail. Patients major concern today is continuing diarrhea. Labs were done which showed recent acute kidney injury has checked at PCPs, with improvement of creatinine today. Electrolytes are within normal limits.  Patient is frustrated  regarding his continuing diarrhea and would like and expedited workup. Sent a message to the gastroenterologist Dr. Loletha Carrow who he is scheduled to see in September to see if visit could be expedited.  Discussed supportive care, and reasons to return to the emergency department in detail.  Final Clinical Impressions(s) / ED Diagnoses   Final diagnoses:  Diarrhea, unspecified type    New Prescriptions Discharge Medication List as of 05/19/2016 11:35 AM    START taking these medications   Details  dicyclomine (BENTYL) 20 MG tablet Take 1 tablet (20 mg total) by mouth 3 (three) times daily before meals., Starting Sun 05/19/2016, Print         Gareth Morgan, MD 05/19/16 [phone]

## 2016-05-19 NOTE — ED Notes (Signed)
Patient states he is unable to provide a urine specimen at this time.

## 2016-05-19 NOTE — ED Triage Notes (Signed)
Pt c/o abdominal pain and diarrhea onset yesterday. Pt has history of same for past 2 months. Pt was seen by PMD and given 2 rounds of antibiotics and stool samples done that were negative. Pt also had a CT scan done but the Dr didn't find anything. Pt has an appointment with a GI Dr in September.

## 2016-05-19 NOTE — ED Notes (Signed)
Pt ambulated to restroom located in room. Pt stated he had too many cords to handle and the urine sample slipped out of his hands and into the toilet. No urine sample was obtained.

## 2016-05-20 ENCOUNTER — Encounter: Payer: Self-pay | Admitting: Adult Health

## 2016-05-21 ENCOUNTER — Ambulatory Visit: Payer: Medicare HMO | Admitting: Family Medicine

## 2016-05-22 ENCOUNTER — Telehealth: Payer: Self-pay

## 2016-05-22 NOTE — Telephone Encounter (Signed)
Left a message for pt to return call. Dr Loletha Carrow would like to see him today (05-22-2016) at 4 pm. Pt only has one phone number on file. Will await return call.

## 2016-05-22 NOTE — Telephone Encounter (Signed)
Still no answer at the [place] residence.

## 2016-05-22 NOTE — Telephone Encounter (Signed)
No answer

## 2016-05-24 NOTE — Telephone Encounter (Signed)
Pt never returned phone calls.

## 2016-05-28 ENCOUNTER — Encounter: Payer: Self-pay | Admitting: Physician Assistant

## 2016-05-28 ENCOUNTER — Ambulatory Visit (INDEPENDENT_AMBULATORY_CARE_PROVIDER_SITE_OTHER): Payer: Medicare HMO | Admitting: Physician Assistant

## 2016-05-28 ENCOUNTER — Other Ambulatory Visit: Payer: Medicare HMO

## 2016-05-28 VITALS — BP 124/76 | HR 68 | Ht 70.0 in | Wt 190.5 lb

## 2016-05-28 DIAGNOSIS — R109 Unspecified abdominal pain: Secondary | ICD-10-CM | POA: Diagnosis not present

## 2016-05-28 DIAGNOSIS — R197 Diarrhea, unspecified: Secondary | ICD-10-CM

## 2016-05-28 MED ORDER — CIPROFLOXACIN HCL 500 MG PO TABS: 500.0000 mg | ORAL_TABLET | Freq: Two times a day (BID) | ORAL | 0 refills | Status: AC

## 2016-05-28 MED ORDER — SACCHAROMYCES BOULARDII 250 MG PO CAPS: 250.0000 mg | ORAL_CAPSULE | Freq: Two times a day (BID) | ORAL | 0 refills | Status: DC

## 2016-05-28 MED ORDER — METRONIDAZOLE 500 MG PO TABS: 500.0000 mg | ORAL_TABLET | Freq: Two times a day (BID) | ORAL | 0 refills | Status: AC

## 2016-05-28 NOTE — Patient Instructions (Addendum)
Please go to the basement level to our lab for stool study.   Take Imodium as needed.   Do not take the antibiotics ( Cipro and Flagyl ) until after you turn in the stool sample.  We sent prescriptions to [facility] 500 mg 2. Flagyl 500 mg 3.  Florastor proboitic.

## 2016-05-28 NOTE — Progress Notes (Signed)
Subjective:    Patient ID: Shane [name], male    DOB: Feb 15, 1932, 82 y.o.   MRN: [id number]  [place] is a very nice 82 year old white male known remotely to Dr. Sharlett Iles, he is referred today by primary care/Nafziger  NP for evaluation of persistent diarrhea. Patient has history of hypertension, GERD, and degenerative joint disease. He says he had a remote colonoscopy by Dr. Sharlett Iles but that may have been 15-20 years ago. He developed acute onset of diarrhea in mid July which has been persistent since. He says at acute onset he had multiple episodes of liquid watery nonbloody diarrhea. He did not have any associated fever or chills. He had not been on any new medications or any recent antibiotics. No foreign travel etc. He was seen by primary care had a stool culture done which was negative and then towards the end of July had stool for O&P which was negative. He was given a short course of Cipro and Flagyl which patient says was for about 3 days, his symptoms did improve but then quickly came back. He had stool for C. difficile PCR done which was negative. He was then given a second course of Cipro and Flagyl again for 3 days by his description. He had some improvement not complete resolution and then diarrhea returned. He describes some mild abdominal cramping before bowel movements but no ongoing pain. His appetite is been fine his weight has been stable occasionally some vague nausea no fever. At this point he is having 4-6 bowel movements per day loose to liquidy and nonbloody.   He has well water at home which she had tested and returned positive for coliform bacteria -he had the well treated yesterday.   Review of Systems Pertinent positive and negative review of systems were noted in the above HPI section.  All other review of systems was otherwise negative.  Outpatient Encounter Prescriptions as of 05/28/2016  Medication Sig  . allopurinol (ZYLOPRIM) 300 MG tablet TAKE 1 TABLET (300 MG  TOTAL) BY MOUTH DAILY.  Marland Kitchen aspirin 81 MG EC tablet Take 81 mg by mouth daily.    Marland Kitchen atenolol-chlorthalidone (TENORETIC) 50-25 MG tablet Take 0.5 tablets by mouth daily.  . clobetasol (TEMOVATE) 0.05 % external solution APPLY TO SCALP TWICE A DAY AS NEEDED FOR FLARES  . dicyclomine (BENTYL) 20 MG tablet Take 1 tablet (20 mg total) by mouth 3 (three) times daily before meals.  . dutasteride (AVODART) 0.5 MG capsule Take 1 capsule (0.5 mg total) by mouth daily.  . Glucosamine 500 MG CAPS Take 1 capsule by mouth 2 (two) times daily.   Marland Kitchen ibuprofen (ADVIL,MOTRIN) 200 MG tablet Take 400 mg by mouth every 8 (eight) hours as needed for mild pain or moderate pain (Takes before playing golf).  Marland Kitchen loratadine (CLARITIN) 10 MG tablet Take 10 mg by mouth daily as needed for allergies. Reported on 10/11/2015  . Multiple Vitamins-Minerals (PRESERVISION AREDS) TABS Take by mouth 2 (two) times daily.  . ciprofloxacin (CIPRO) 500 MG tablet Take 1 tablet (500 mg total) by mouth 2 (two) times daily.  . metroNIDAZOLE (FLAGYL) 500 MG tablet Take 1 tablet (500 mg total) by mouth 2 (two) times daily.  Marland Kitchen saccharomyces boulardii (FLORASTOR) 250 MG capsule Take 1 capsule (250 mg total) by mouth 2 (two) times daily.   No facility-administered encounter medications on file as of 05/28/2016.    No Known Allergies Patient Active Problem List   Diagnosis Date Noted  . Hypertriglyceridemia  12/01/2015  . Actinic keratoses 07/19/2015  . Right tennis elbow 01/10/2014  . Dysplastic nevus of left upper extremity 03/22/2013  . Gout of big toe 10/15/2012  . EXTRINSIC ASTHMA, UNSPECIFIED 01/11/2009  . SPINAL STENOSIS 05/24/2008  . PROSTATE SPECIFIC ANTIGEN, ELEVATED 05/24/2008  . OBESITY 08/13/2006  . ANEMIA-IRON DEFICIENCY 08/13/2006  . Essential hypertension 08/13/2006  . ALLERGIC RHINITIS 08/13/2006  . GERD 08/13/2006  . HIATAL HERNIA 08/13/2006  . DEGENERATIVE JOINT DISEASE 08/13/2006   Social History   Social History  .  Marital status: Married    Spouse name: N/A  . Number of children: 3  . Years of education: N/A   Occupational History  . retired    Social History Main Topics  . Smoking status: Former Research scientist (life sciences)  . Smokeless tobacco: Never Used  . Alcohol use No  . Drug use: No  . Sexual activity: Not on file   Other Topics Concern  . Not on file   Social History Narrative   Retired from Psychologist, educational    Married for 55 years    Three children all live locally    [place] to play golf and go to [employer].     Mr. Rudden family history includes Hypertension in his father; Multiple myeloma in his brother; Prostate cancer in his brother.      Objective:    Vitals:   05/28/16 1014  BP: 124/76  Pulse: 68    Physical Exam  well-developed elderly male in no acute distress, pleasant blood pressure 124/76 pulse 68, height 5 foot 10 weight 190 BMI 27.3. HEENT; nontraumatic, cephalic EOMI PERRLA sclera anicteric, Cardiovascular; regular rate and rhythm with S1-S2 no murmur rub or gallop, Pulmonary; clear bilaterally, Abdomen; soft, nontender nondistended bowel sounds are active there is no palpable mass or hepatosplenomegaly, Rectal ;exam not done, Ext;no clubbing cyanosis or edema skin warm and dry, Neuropsych ;mood and affect appropriate       Assessment & Plan:   #13  82 year old male 6-7 week history of diarrhea partially responsive to short courses of Cipro and Flagyl. He is nontoxic appearing and labs done on 05/19/2016 reassuring. With well water testing positive for cold form bacteria suspect he has been the having repeated exposure and likely does have an infectious diarrhea. #2 hypertension #3 history of GERD #4 DJD  Plan; GI pathogen panel Will give a seven-day course of Cipro 500 by mouth twice a day and Flagyl 500 by mouth twice a day, hopefully now the well has been treated he will have any repeated exposure Start Florastor one by mouth twice a day 1 month Patient has a follow-up  appointment scheduled with Dr. Loletha Carrow  in mid September. He is encouraged to keep that appointment if he still having symptoms as next step would be colonoscopy. I told him he could cancel the appointment if symptoms have resolved.  Colleena Kurtenbach S Laiza Veenstra PA-C 05/28/2016   Cc: Dorothyann Peng, NP

## 2016-05-29 LAB — GASTROINTESTINAL PATHOGEN PANEL PCR
C. difficile Tox A/B, PCR: DETECTED — CR
CAMPYLOBACTER, PCR: NOT DETECTED
CRYPTOSPORIDIUM, PCR: NOT DETECTED
E COLI 0157, PCR: NOT DETECTED
E coli (ETEC) LT/ST PCR: NOT DETECTED
E coli (STEC) stx1/stx2, PCR: NOT DETECTED
GIARDIA LAMBLIA, PCR: NOT DETECTED
NOROVIRUS, PCR: NOT DETECTED
Rotavirus A, PCR: NOT DETECTED
SHIGELLA, PCR: NOT DETECTED
Salmonella, PCR: NOT DETECTED

## 2016-05-29 NOTE — Progress Notes (Signed)
Thank you for sending this case to me. I have reviewed the entire note, and the outlined plan seems appropriate.  If he is not completely better when I see him next month (or if he calls sooner than that because still has diarrhea), schedule a colonoscopy to look for overt or microscopic colitis.

## 2016-05-30 ENCOUNTER — Other Ambulatory Visit: Payer: Self-pay | Admitting: *Deleted

## 2016-05-30 ENCOUNTER — Telehealth: Payer: Self-pay | Admitting: *Deleted

## 2016-05-30 MED ORDER — VANCOMYCIN HCL 250 MG PO CAPS: 250.0000 mg | ORAL_CAPSULE | Freq: Four times a day (QID) | ORAL | 0 refills | Status: AC

## 2016-05-30 NOTE — Telephone Encounter (Signed)
The patient called back and I advised him of the postivie C-Diff results. We sent a prescription of Vancomycin 250 mg and Florastor probiotic. He is to take the Vanc 4 times daily x 14 days. Take the Florastor twice daily x one month.  Sent the prescriptoin to [place] [facility]

## 2016-05-30 NOTE — Progress Notes (Signed)
Sent the [employer] on 05-28-2016.

## 2016-05-30 NOTE — Telephone Encounter (Signed)
LM for the patient to please call me back. Need to advise him of Stool Study resutls.

## 2016-06-11 ENCOUNTER — Ambulatory Visit: Payer: Medicare HMO | Admitting: Gastroenterology

## 2016-06-18 ENCOUNTER — Ambulatory Visit (INDEPENDENT_AMBULATORY_CARE_PROVIDER_SITE_OTHER): Payer: Medicare HMO | Admitting: *Deleted

## 2016-06-18 DIAGNOSIS — Z23 Encounter for immunization: Secondary | ICD-10-CM

## 2016-06-21 ENCOUNTER — Other Ambulatory Visit: Payer: Medicare HMO

## 2016-06-21 ENCOUNTER — Other Ambulatory Visit: Payer: Self-pay

## 2016-06-21 ENCOUNTER — Other Ambulatory Visit: Payer: Self-pay | Admitting: Gastroenterology

## 2016-06-21 ENCOUNTER — Telehealth: Payer: Self-pay | Admitting: Physician Assistant

## 2016-06-21 DIAGNOSIS — R197 Diarrhea, unspecified: Secondary | ICD-10-CM

## 2016-06-21 DIAGNOSIS — A047 Enterocolitis due to Clostridium difficile: Secondary | ICD-10-CM | POA: Diagnosis not present

## 2016-06-21 DIAGNOSIS — A0472 Enterocolitis due to Clostridium difficile, not specified as recurrent: Secondary | ICD-10-CM

## 2016-06-21 DIAGNOSIS — A09 Infectious gastroenteritis and colitis, unspecified: Secondary | ICD-10-CM | POA: Diagnosis not present

## 2016-06-21 NOTE — Telephone Encounter (Signed)
82 yr old patient who finished a 14 day course of vancomycin on 06/13/16. Today he has had a sudden onset of liquid stools and abdominal tenderness. He had has 4 stools so far and they are urgent and of small volume. He is still taking Florastor. Please advise.

## 2016-06-21 NOTE — Telephone Encounter (Signed)
Patient is in agreement with this plan. He will come in today

## 2016-06-21 NOTE — Telephone Encounter (Signed)
Yes, he should definitely be tested. Please have him get a specimen to the lab today if he is able.  C difficile PCR and toxin tests

## 2016-06-22 LAB — C. DIFFICILE GDH AND TOXIN A/B
C. difficile GDH: DETECTED — AB
C. difficile Toxin A/B: DETECTED — AB

## 2016-06-22 LAB — CLOSTRIDIUM DIFFICILE BY PCR: CDIFFPCR: DETECTED — AB

## 2016-06-24 ENCOUNTER — Other Ambulatory Visit: Payer: Self-pay

## 2016-06-24 MED ORDER — VANCOMYCIN HCL 250 MG PO CAPS: ORAL_CAPSULE | ORAL | 0 refills | Status: DC

## 2016-07-30 ENCOUNTER — Ambulatory Visit (INDEPENDENT_AMBULATORY_CARE_PROVIDER_SITE_OTHER): Payer: Medicare HMO | Admitting: Gastroenterology

## 2016-07-30 ENCOUNTER — Encounter: Payer: Self-pay | Admitting: Gastroenterology

## 2016-07-30 VITALS — BP 114/70 | HR 60 | Ht 68.0 in | Wt 194.4 lb

## 2016-07-30 DIAGNOSIS — A0472 Enterocolitis due to Clostridium difficile, not specified as recurrent: Secondary | ICD-10-CM | POA: Diagnosis not present

## 2016-07-30 NOTE — Progress Notes (Signed)
[facility] Progress Note  Chief Complaint: C. difficile diarrhea  Subjective  History:  Shane [name] follows up with me after recently seeing one of our APP's. He had several months of diarrhea and was diagnosed with C. difficile colitis. We treated him with 14 days of vancomycin 250 mg 4 times daily. He was well for about a week, then his diarrhea recurred the same as before. I then give him a treatment course of 21 days of vancomycin 4 times daily, and then he decreased to 250 mg 4 times daily Mondays and Thursdays and additional 3 weeks. He has about a week left of that course. He is having one or 2 formed bowel movements per day denies abdominal pain or rectal bleeding. His appetite is good and his weight stable. ROS: Cardiovascular:  no chest pain Respiratory: no dyspnea  The patient's Past Medical, Family and Social History were reviewed and are on file in the EMR.  Objective:  Med list reviewed  Vital signs in last 24 hrs: Vitals:   07/30/16 1047  BP: 114/70  Pulse: 60    Physical Exam  He is well-appearing  HEENT: sclera anicteric, oral mucosa moist without lesions  Neck: supple, no thyromegaly, JVD or lymphadenopathy  Cardiac: RRR without murmurs, S1S2 heard, no peripheral edema  Pulm: clear to auscultation bilaterally, normal RR and effort noted  Abdomen: soft, no tenderness, with active bowel sounds. No guarding or palpable hepatosplenomegaly.  Skin; warm and dry, no jaundice or rash  Recent Labs: C. difficile PCR positive    @ASSESSMENTPLANBEGIN @ Assessment: Encounter Diagnosis  Name Primary?  . C. difficile diarrhea Yes   He is much better, and hopefully this tapering course will resolve the infection.   Plan: He knows to call us if the diarrhea recurs, at which time we will repeat his C. difficile test. If positive, I would resume vancomycin and refer him to infectious disease. He will continue his probiotic at nighttime for the duration of  antibiotic therapy and 3-4 weeks afterwards.   Total time 20 minutes, over half spent in counseling and coordination of care.   [name]

## 2016-07-30 NOTE — Patient Instructions (Addendum)
If you are age 80 or older, your body mass index should be between 23-30. Your Body mass index is 29.55 kg/m. If this is out of the aforementioned range listed, please consider follow up with your Primary Care Provider.  If you are age 47 or younger, your body mass index should be between 19-25. Your Body mass index is 29.55 kg/m. If this is out of the aformentioned range listed, please consider follow up with your Primary Care Provider.   Follow up as needed.  Thank you for choosing [facility]  Dr Wilfrid Lund III

## 2016-08-06 ENCOUNTER — Other Ambulatory Visit: Payer: Self-pay | Admitting: Adult Health

## 2016-08-20 ENCOUNTER — Telehealth: Payer: Self-pay | Admitting: Gastroenterology

## 2016-08-20 NOTE — Telephone Encounter (Signed)
Unfortunately, he most likely has the C diff again.  Please have him bring a specimen to the lab for C diff toxin and antigen testing.  However, because he most likely has it, also send a prescription for vancomycin 250 mg , one tablet four times daily x 21 days.  It is time for him to see infectious disease ASAP.  I would like him to see Dr Baxter Flattery because if they do not have other medicines to offer and clear this, he may need fecal transplant therapy.

## 2016-08-20 NOTE — Telephone Encounter (Signed)
Patient had finished the vancomycin and had a week to 10 days of no diarrhea. He had last Tuesday one episode of diarrhea, he had some flagyl left, took two tablets which seemed to helped. He had another episode Friday, took another two tablets of flagyl. He then had numerous episodes yesterday and last night. He denies fever, does have occasional peri-umbilical pain. He is taking the probiotic as directed. Please advise.

## 2016-08-21 ENCOUNTER — Other Ambulatory Visit: Payer: Self-pay | Admitting: Gastroenterology

## 2016-08-21 ENCOUNTER — Other Ambulatory Visit: Payer: Self-pay

## 2016-08-21 ENCOUNTER — Other Ambulatory Visit: Payer: Medicare HMO

## 2016-08-21 DIAGNOSIS — R197 Diarrhea, unspecified: Secondary | ICD-10-CM

## 2016-08-21 DIAGNOSIS — Z8619 Personal history of other infectious and parasitic diseases: Secondary | ICD-10-CM

## 2016-08-21 DIAGNOSIS — A09 Infectious gastroenteritis and colitis, unspecified: Secondary | ICD-10-CM | POA: Diagnosis not present

## 2016-08-21 MED ORDER — VANCOMYCIN HCL 250 MG PO CAPS: 250.0000 mg | ORAL_CAPSULE | Freq: Four times a day (QID) | ORAL | 0 refills | Status: AC

## 2016-08-21 NOTE — Telephone Encounter (Signed)
Spoke to patient, asked that he come drop off a stool sample prior to restarting the antibiotic. He will come get the specimen container today. He does state that he does feel better today. I let him know that I have sent in the prescription for his antibiotic and that I have also sent in an urgent referral to infectious disease. I did speak to Dr. Storm Frisk office and they do have the referral, will call the patient either today or early next week.

## 2016-08-21 NOTE — Progress Notes (Signed)
Rx sent to pharmacy   

## 2016-08-22 LAB — C. DIFFICILE GDH AND TOXIN A/B
C. DIFF TOXIN A/B: NOT DETECTED
C. difficile GDH: NOT DETECTED

## 2016-08-28 ENCOUNTER — Other Ambulatory Visit: Payer: Self-pay

## 2016-08-28 ENCOUNTER — Telehealth: Payer: Self-pay

## 2016-08-28 MED ORDER — NA SULFATE-K SULFATE-MG SULF 17.5-3.13-1.6 GM/177ML PO SOLN: 1.0000 | Freq: Once | ORAL | 0 refills | Status: AC

## 2016-08-28 NOTE — Progress Notes (Signed)
sup

## 2016-08-28 NOTE — Telephone Encounter (Signed)
Called Infectious Disease to let them know that Dr. Loletha Carrow wants to hold off right now on a consult appointment.

## 2016-08-28 NOTE — Progress Notes (Signed)
Prep. instructions

## 2016-08-28 NOTE — Progress Notes (Signed)
Prep instructions picked up by patient.

## 2016-08-30 ENCOUNTER — Telehealth: Payer: Self-pay

## 2016-08-30 NOTE — Telephone Encounter (Signed)
Called patient, had to lvm asking that he call back to the office. Dr. Loletha Carrow is trying wondering how he is doing, if he has had any more diarrhea. Tried to reach patient again, no answer, did not leave another message.

## 2016-08-30 NOTE — Telephone Encounter (Signed)
We will proceed with colonoscopy Monday as scheduled

## 2016-08-30 NOTE — Telephone Encounter (Signed)
Patient called back, he states he is doing okay. No diarrhea, is having formed bm's. Is still continuing to have intermittent abdominal pain, but otherwise doing okay.

## 2016-09-02 ENCOUNTER — Encounter: Payer: Self-pay | Admitting: Gastroenterology

## 2016-09-02 ENCOUNTER — Ambulatory Visit (AMBULATORY_SURGERY_CENTER): Payer: Medicare HMO | Admitting: Gastroenterology

## 2016-09-02 VITALS — BP 133/76 | HR 64 | Temp 97.1°F | Resp 22 | Ht 68.0 in | Wt 194.0 lb

## 2016-09-02 DIAGNOSIS — R197 Diarrhea, unspecified: Secondary | ICD-10-CM | POA: Diagnosis not present

## 2016-09-02 DIAGNOSIS — R69 Illness, unspecified: Secondary | ICD-10-CM | POA: Diagnosis not present

## 2016-09-02 MED ORDER — SODIUM CHLORIDE 0.9 % IV SOLN: 500.0000 mL | INTRAVENOUS | Status: DC

## 2016-09-02 NOTE — Progress Notes (Signed)
Called to room to assist during endoscopic procedure.  Patient ID and intended procedure confirmed with present staff. Received instructions for my participation in the procedure from the performing physician.  

## 2016-09-02 NOTE — Op Note (Signed)
[facility] Patient Name: Shane [name] Procedure Date: 09/02/2016 2:33 PM MRN: [id number] Endoscopist: [place]. Loletha [name] , MD Age: 82 Referring MD:  Date of Birth: [birth year] Gender: Male Account #: [id number] Procedure:                Colonoscopy Indications:              Chronic diarrhea Medicines:                Monitored Anesthesia Care Procedure:                Pre-Anesthesia Assessment:                           - Prior to the procedure, a History and Physical                            was performed, and patient medications and                            allergies were reviewed. The patient's tolerance of                            previous anesthesia was also reviewed. The risks                            and benefits of the procedure and the sedation                            options and risks were discussed with the patient.                            All questions were answered, and informed consent                            was obtained. Anticoagulants: The patient has taken                            aspirin. It was decided not to withhold this                            medication prior to the procedure. ASA Grade                            Assessment: II - A patient with mild systemic                            disease. After reviewing the risks and benefits,                            the patient was deemed in satisfactory condition to                            undergo the procedure.  After obtaining informed consent, the colonoscope                            was passed under direct vision. Throughout the                            procedure, the patient's blood pressure, pulse, and                            oxygen saturations were monitored continuously. The                            Model CF-HQ190L [id number]) scope was introduced                            through the anus and advanced to the the terminal        ileum. The colonoscopy was performed without                            difficulty. The patient tolerated the procedure                            well. The quality of the bowel preparation was                            excellent. The terminal ileum, ileocecal valve,                            appendiceal orifice, and rectum were photographed.                            The bowel preparation used was SUPREP. Scope In: 2:40:40 PM Scope Out: 2:54:26 PM Scope Withdrawal Time: 0 hours 8 minutes 8 seconds  Total Procedure Duration: 0 hours 13 minutes 46 seconds  Findings:                 The perianal and digital rectal examinations were                            normal.                           The terminal ileum appeared normal.                           Normal mucosa was found in the entire colon.                            Biopsies for histology were taken with a cold                            forceps from the right colon and left colon for                            evaluation  of microscopic colitis.                           Internal hemorrhoids were found during                            retroflexion. The hemorrhoids were medium-sized and                            Grade I (internal hemorrhoids that do not prolapse).                           The exam was otherwise without abnormality. Complications:            No immediate complications. Estimated Blood Loss:     Estimated blood loss: none. Impression:               - The examined portion of the ileum was normal.                           - Normal mucosa in the entire examined colon.                            Biopsied.                           - Internal hemorrhoids.                           - The examination was otherwise normal. Recommendation:           - Patient has a contact number available for                            emergencies. The signs and symptoms of potential                            delayed  complications were discussed with the                            patient. Return to normal activities tomorrow.                            Written discharge instructions were provided to the                            patient.                           - Resume previous diet.                           - Continue present medications.                           - Await pathology results.                           -  No recommendation at this time regarding future                            routine colonoscopy due to age. Shane L. Loletha Carrow, MD 09/02/2016 2:58:50 PM This report has been signed electronically.

## 2016-09-02 NOTE — Patient Instructions (Signed)
Impression/Recommendations:  Hemorrhoid handout given to patient.  YOU HAD AN ENDOSCOPIC PROCEDURE TODAY AT THE [facility]:   Refer to the procedure report that was given to you for any specific questions about what was found during the examination.  If the procedure report does not answer your questions, please call your gastroenterologist to clarify.  If you requested that your care partner not be given the details of your procedure findings, then the procedure report has been included in a sealed envelope for you to review at your convenience later.  YOU SHOULD EXPECT: Some feelings of bloating in the abdomen. Passage of more gas than usual.  Walking can help get rid of the air that was put into your GI tract during the procedure and reduce the bloating. If you had a lower endoscopy (such as a colonoscopy or flexible sigmoidoscopy) you may notice spotting of blood in your stool or on the toilet paper. If you underwent a bowel prep for your procedure, you may not have a normal bowel movement for a few days.  Please Note:  You might notice some irritation and congestion in your nose or some drainage.  This is from the oxygen used during your procedure.  There is no need for concern and it should clear up in a day or so.  SYMPTOMS TO REPORT IMMEDIATELY:   Following lower endoscopy (colonoscopy or flexible sigmoidoscopy):  Excessive amounts of blood in the stool  Significant tenderness or worsening of abdominal pains  Swelling of the abdomen that is new, acute  Fever of 100F or higher For urgent or emergent issues, a gastroenterologist can be reached at any hour by calling [phone].   DIET:  We do recommend a small meal at first, but then you may proceed to your regular diet.  Drink plenty of fluids but you should avoid alcoholic beverages for 24 hours.  ACTIVITY:  You should plan to take it easy for the rest of today and you should NOT DRIVE or use heavy machinery until  tomorrow (because of the sedation medicines used during the test).    FOLLOW UP: Our staff will call the number listed on your records the next business day following your procedure to check on you and address any questions or concerns that you may have regarding the information given to you following your procedure. If we do not reach you, we will leave a message.  However, if you are feeling well and you are not experiencing any problems, there is no need to return our call.  We will assume that you have returned to your regular daily activities without incident.  If any biopsies were taken you will be contacted by phone or by letter within the next 1-3 weeks.  Please call us at [phone] if you have not heard about the biopsies in 3 weeks.    SIGNATURES/CONFIDENTIALITY: You and/or your care partner have signed paperwork which will be entered into your electronic medical record.  These signatures attest to the fact that that the information above on your After Visit Summary has been reviewed and is understood.  Full responsibility of the confidentiality of this discharge information lies with you and/or your care-partner. 

## 2016-09-02 NOTE — Progress Notes (Signed)
A/ox3 pleased with MAC, report to Jane RN 

## 2016-09-03 ENCOUNTER — Telehealth: Payer: Self-pay

## 2016-09-03 NOTE — Telephone Encounter (Signed)
  Follow up Call-  Call back number 09/02/2016  Post procedure Call Back phone  # [phone]  Permission to leave phone message Yes  Some recent data might be hidden     Patient questions:  Do you have a fever, pain , or abdominal swelling? No. Pain Score  0 *  Have you tolerated food without any problems? Yes.    Have you been able to return to your normal activities? Yes.    Do you have any questions about your discharge instructions: Diet   No. Medications  No. Follow up visit  No.  Do you have questions or concerns about your Care? No.  Actions: * If pain score is 4 or above: No action needed, pain <4.

## 2016-09-04 ENCOUNTER — Other Ambulatory Visit: Payer: Self-pay | Admitting: Family Medicine

## 2016-09-06 ENCOUNTER — Other Ambulatory Visit: Payer: Self-pay

## 2016-09-06 ENCOUNTER — Other Ambulatory Visit: Payer: Self-pay | Admitting: Gastroenterology

## 2016-09-06 ENCOUNTER — Telehealth: Payer: Self-pay | Admitting: Gastroenterology

## 2016-09-06 MED ORDER — BUDESONIDE 9 MG PO TB24: 1.0000 | ORAL_TABLET | Freq: Every day | ORAL | 0 refills | Status: DC

## 2016-09-06 MED ORDER — MESALAMINE 800 MG PO TBEC
2.0000 | DELAYED_RELEASE_TABLET | Freq: Two times a day (BID) | ORAL | 0 refills | Status: DC
Start: 2016-09-06 — End: 2016-11-05

## 2016-09-06 NOTE — Telephone Encounter (Signed)
Per pt budesonide not covered with his insurance. Needs prior auth.

## 2016-09-06 NOTE — Telephone Encounter (Signed)
Let patient know that rx was send to his pharmacy.

## 2016-09-06 NOTE — Telephone Encounter (Signed)
Sent in new prescription for Asacol HD 800 mg to take two tabs twice daily for 4 weeks.

## 2016-09-11 DIAGNOSIS — D1801 Hemangioma of skin and subcutaneous tissue: Secondary | ICD-10-CM | POA: Diagnosis not present

## 2016-09-11 DIAGNOSIS — L820 Inflamed seborrheic keratosis: Secondary | ICD-10-CM | POA: Diagnosis not present

## 2016-09-11 DIAGNOSIS — L814 Other melanin hyperpigmentation: Secondary | ICD-10-CM | POA: Diagnosis not present

## 2016-09-11 DIAGNOSIS — Z85828 Personal history of other malignant neoplasm of skin: Secondary | ICD-10-CM | POA: Diagnosis not present

## 2016-09-11 DIAGNOSIS — L821 Other seborrheic keratosis: Secondary | ICD-10-CM | POA: Diagnosis not present

## 2016-09-11 DIAGNOSIS — L570 Actinic keratosis: Secondary | ICD-10-CM | POA: Diagnosis not present

## 2016-09-11 DIAGNOSIS — D225 Melanocytic nevi of trunk: Secondary | ICD-10-CM | POA: Diagnosis not present

## 2016-09-11 DIAGNOSIS — D2372 Other benign neoplasm of skin of left lower limb, including hip: Secondary | ICD-10-CM | POA: Diagnosis not present

## 2016-09-18 ENCOUNTER — Telehealth: Payer: Self-pay | Admitting: Gastroenterology

## 2016-09-18 NOTE — Telephone Encounter (Signed)
Patient called, reports having 2 good days with more formed stools, then will have 3-4 days with frequent, watery stools. Patient has been on Asacol since 12/10. He is going out of town next week, hoping that he can get some relief before then. Please advise.

## 2016-09-18 NOTE — Telephone Encounter (Signed)
Avoid NSAIDs as much as possible (such as ibuprofen) since these are possibly related to the microscopic colitis that he has.

## 2016-09-18 NOTE — Telephone Encounter (Signed)
Unfortunately, his insurance would not cover budesonide.  Add imodium 2 tablets twice daily on days when diarrhea occurs.

## 2016-09-19 NOTE — Telephone Encounter (Signed)
Spoke to patient, advised to try imodium 2 tablets twice a day when he has the diarrhea. Advised to stay away from NSAIDs, went over with patient what those medications are, told him that Tylenol is okay if he needed something for a headache or pain. Continue taking Asacol. Patient reports that today has been a good day, instructed to not take the imodium on good days.

## 2016-09-22 ENCOUNTER — Other Ambulatory Visit: Payer: Self-pay | Admitting: Family Medicine

## 2016-09-24 ENCOUNTER — Other Ambulatory Visit: Payer: Self-pay | Admitting: Emergency Medicine

## 2016-09-24 MED ORDER — ATENOLOL-CHLORTHALIDONE 50-25 MG PO TABS: 0.5000 | ORAL_TABLET | Freq: Every day | ORAL | 3 refills | Status: DC

## 2016-10-07 ENCOUNTER — Other Ambulatory Visit: Payer: Self-pay

## 2016-10-07 ENCOUNTER — Telehealth: Payer: Self-pay | Admitting: Gastroenterology

## 2016-10-07 ENCOUNTER — Other Ambulatory Visit: Payer: Medicare HMO

## 2016-10-07 DIAGNOSIS — R197 Diarrhea, unspecified: Secondary | ICD-10-CM

## 2016-10-07 DIAGNOSIS — Z8619 Personal history of other infectious and parasitic diseases: Secondary | ICD-10-CM

## 2016-10-07 NOTE — Telephone Encounter (Signed)
Yes, I agree that we should test him again for C diff, even though the most recent test was negative.  C diff stool antigen and toxin A/B.  Please have him do it tomorrow.  If negative, then this appears to be from the microscopic colitis and we will prescribe lomotil.  I would have preferred budesonide (Uceris), but I seem to recall that his insurance would not cover it.

## 2016-10-07 NOTE — Telephone Encounter (Signed)
Spoke to patient he will come to lab to pick up specimen container today and will try to submit stool sample tomorrow. Of note,patient did say that as far as the budesonide, he was in the "donut hole" as far as cost, he is now out of that and feels sure that the cost to him will be more reasonable.

## 2016-10-07 NOTE — Telephone Encounter (Signed)
Patient states that for one week (last week of December) he had normal bm's, then started having diarrhea again. He has tried imodium, not helping. He finished the last of the Asacol today. Please advise.

## 2016-10-08 ENCOUNTER — Other Ambulatory Visit: Payer: Medicare HMO

## 2016-10-08 DIAGNOSIS — Z0101 Encounter for examination of eyes and vision with abnormal findings: Secondary | ICD-10-CM | POA: Diagnosis not present

## 2016-10-08 DIAGNOSIS — R197 Diarrhea, unspecified: Secondary | ICD-10-CM

## 2016-10-08 DIAGNOSIS — Z8619 Personal history of other infectious and parasitic diseases: Secondary | ICD-10-CM | POA: Diagnosis not present

## 2016-10-08 DIAGNOSIS — Z0100 Encounter for examination of eyes and vision without abnormal findings: Secondary | ICD-10-CM | POA: Diagnosis not present

## 2016-10-08 DIAGNOSIS — A09 Infectious gastroenteritis and colitis, unspecified: Secondary | ICD-10-CM | POA: Diagnosis not present

## 2016-10-09 ENCOUNTER — Other Ambulatory Visit: Payer: Self-pay

## 2016-10-09 ENCOUNTER — Telehealth: Payer: Self-pay

## 2016-10-09 LAB — C. DIFFICILE GDH AND TOXIN A/B
C. DIFF TOXIN A/B: NOT DETECTED
C. DIFFICILE GDH: DETECTED — AB

## 2016-10-09 LAB — CLOSTRIDIUM DIFFICILE BY PCR: CDIFFPCR: DETECTED — AB

## 2016-10-09 MED ORDER — METRONIDAZOLE 500 MG PO TABS: 500.0000 mg | ORAL_TABLET | Freq: Two times a day (BID) | ORAL | 0 refills | Status: AC

## 2016-10-09 NOTE — Telephone Encounter (Signed)
Patient advised of results. Let him know that Rx is sent to his pharmacy. I also told him that I would be sending this message to Dr. Loletha Carrow for any further recommendations.

## 2016-10-09 NOTE — Telephone Encounter (Signed)
Metronidazole 500 mg po bid for 14 days Florastor 1 po bid for 6 weeks Further plans per Dr. Loletha Carrow

## 2016-10-09 NOTE — Telephone Encounter (Signed)
Lab called with a positive C-diff. As doc of the day, please advise. Thank you.

## 2016-10-10 ENCOUNTER — Other Ambulatory Visit: Payer: Self-pay

## 2016-10-10 DIAGNOSIS — A0472 Enterocolitis due to Clostridium difficile, not specified as recurrent: Secondary | ICD-10-CM

## 2016-10-10 MED ORDER — VANCOMYCIN HCL 125 MG PO CAPS: 125.0000 mg | ORAL_CAPSULE | Freq: Four times a day (QID) | ORAL | 0 refills | Status: DC

## 2016-10-10 NOTE — Telephone Encounter (Signed)
I will tell him to remain on the vancomycin at least until he sees Dr Baxter Flattery. I expect he will hear from them with an appointment.

## 2016-10-10 NOTE — Telephone Encounter (Signed)
Spoke to patient, let him know to stop taking the flagyl, pick up new prescription for the vancomycin 125 mg at his [facility] pharmacy. He is also aware that he will need to see Dr. Baxter Flattery at infectious disease, told him to expect a call from Dr. Loletha Carrow today to discuss further recommendations. Ambulatory Referral place to infectious disease to see Dr. Retia Passe.

## 2016-10-10 NOTE — Telephone Encounter (Signed)
Please cancel order for metronidazole.  It will need to be vancomycin  I was aware of the positive C diff test earlier in the day, but I was waiting for the confirmatory test to be resulted.  Plan: Vancomycin 125 mg four times a day.  Disp 120 tablets, RF none  It is now time for the "fecal transplant" we talked about to hopefully clear this problem for good.  As such, he needs an ASAP appointment with Dr Carlyle Basques (onyl her) of Infectious Disease to discuss this treatment.  It will require a colonoscopy in the near future to deliver the therapy.    I will call him later today to discuss further.  I will also send a message to Dr Baxter Flattery about the need for an appointment with her very soon.

## 2016-10-10 NOTE — Telephone Encounter (Signed)
I will be able to see him in 2-3 wk

## 2016-10-17 ENCOUNTER — Ambulatory Visit: Payer: Medicare HMO | Admitting: Gastroenterology

## 2016-10-29 ENCOUNTER — Other Ambulatory Visit: Payer: Self-pay

## 2016-10-29 DIAGNOSIS — A498 Other bacterial infections of unspecified site: Secondary | ICD-10-CM

## 2016-10-29 NOTE — Progress Notes (Signed)
Letter mailed

## 2016-11-05 ENCOUNTER — Ambulatory Visit (INDEPENDENT_AMBULATORY_CARE_PROVIDER_SITE_OTHER): Payer: Medicare HMO | Admitting: Internal Medicine

## 2016-11-05 ENCOUNTER — Encounter: Payer: Self-pay | Admitting: Internal Medicine

## 2016-11-05 VITALS — BP 149/81 | HR 50 | Temp 97.8°F | Ht 70.0 in | Wt 186.0 lb

## 2016-11-05 DIAGNOSIS — A0471 Enterocolitis due to Clostridium difficile, recurrent: Secondary | ICD-10-CM

## 2016-11-05 NOTE — Progress Notes (Signed)
RFV: recurrent cdifficile  Patient ID: Shane [name], male   DOB: 1932/09/30, 83 y.o.   MRN: [id number]  HPI 83yo M with recurrent cdifficile.He has hx of treated for 14 days. Then relapse, treated with taper of 4 times per day then pulse dosing  He recently had relapse and feel that he needed FMT  He is scheduled to get FMT on 2/20, 1/9 detected GDH+, toxin negative PCR +  Tested on 08/21/16 and on 9/22  Outpatient Encounter Prescriptions as of 11/05/2016  Medication Sig  . allopurinol (ZYLOPRIM) 300 MG tablet TAKE 1 TABLET BY MOUTH EVERY DAY  . aspirin 81 MG EC tablet Take 81 mg by mouth daily.    Marland Kitchen atenolol-chlorthalidone (TENORETIC) 50-25 MG tablet Take 0.5 tablets by mouth daily.  Marland Kitchen dutasteride (AVODART) 0.5 MG capsule TAKE 1 CAPSULE(0.5 MG) BY MOUTH DAILY  . Glucosamine 500 MG CAPS Take 1 capsule by mouth 2 (two) times daily.   . Multiple Vitamins-Minerals (PRESERVISION AREDS) TABS Take by mouth 2 (two) times daily.  Marland Kitchen saccharomyces boulardii (FLORASTOR) 250 MG capsule Take 1 capsule (250 mg total) by mouth 2 (two) times daily.  . vancomycin (VANCOCIN) 125 MG capsule Take 1 capsule (125 mg total) by mouth 4 (four) times daily.  . [DISCONTINUED] ibuprofen (ADVIL,MOTRIN) 200 MG tablet Take 400 mg by mouth every 8 (eight) hours as needed for mild pain or moderate pain (Takes before playing golf).  . [DISCONTINUED] Budesonide 9 MG TB24 Take 1 tablet by mouth daily.  . [DISCONTINUED] clobetasol (TEMOVATE) 0.05 % external solution APPLY TO SCALP TWICE A DAY AS NEEDED FOR FLARES  . [DISCONTINUED] loratadine (CLARITIN) 10 MG tablet Take 10 mg by mouth daily as needed for allergies. Reported on 10/11/2015  . [DISCONTINUED] Mesalamine (ASACOL HD) 800 MG TBEC Take 2 tablets (1,600 mg total) by mouth 2 (two) times daily.  . [DISCONTINUED] vancomycin (VANCOCIN) 250 MG capsule Take 1 tablet by mouth 4 times a day for 21 days, Take 1 tablet by mouth 4 times a day on Monday and Thursday for 3  weeks. (Patient not taking: Reported on 09/02/2016)   Facility-Administered Encounter Medications as of 11/05/2016  Medication  . 0.9 %  sodium chloride infusion     Patient Active Problem List   Diagnosis Date Noted  . Hypertriglyceridemia 12/01/2015  . Actinic keratoses 07/19/2015  . Right tennis elbow 01/10/2014  . Dysplastic nevus of left upper extremity 03/22/2013  . Gout of big toe 10/15/2012  . EXTRINSIC ASTHMA, UNSPECIFIED 01/11/2009  . SPINAL STENOSIS 05/24/2008  . PROSTATE SPECIFIC ANTIGEN, ELEVATED 05/24/2008  . OBESITY 08/13/2006  . ANEMIA-IRON DEFICIENCY 08/13/2006  . Essential hypertension 08/13/2006  . ALLERGIC RHINITIS 08/13/2006  . GERD 08/13/2006  . HIATAL HERNIA 08/13/2006  . DEGENERATIVE JOINT DISEASE 08/13/2006     There are no preventive care reminders to display for this patient.   Review of Systems Review of Systems  Constitutional: Negative for fever, chills, diaphoresis, activity change, appetite change, fatigue and unexpected weight change.  HENT: Negative for congestion, sore throat, rhinorrhea, sneezing, trouble swallowing and sinus pressure.  Eyes: Negative for photophobia and visual disturbance.  Respiratory: Negative for cough, chest tightness, shortness of breath, wheezing and stridor.  Cardiovascular: Negative for chest pain, palpitations and leg swelling.  Gastrointestinal: Negative for nausea, vomiting, abdominal pain, diarrhea, constipation, blood in stool, abdominal distention and anal bleeding.  Genitourinary: Negative for dysuria, hematuria, flank pain and difficulty urinating.  Musculoskeletal: Negative for myalgias, back pain, joint swelling, arthralgias  and gait problem.  Skin: Negative for color change, pallor, rash and wound.  Neurological: Negative for dizziness, tremors, weakness and light-headedness.  Hematological: Negative for adenopathy. Does not bruise/bleed easily.  Psychiatric/Behavioral: Negative for behavioral problems,  confusion, sleep disturbance, dysphoric mood, decreased concentration and agitation.    Physical Exam   BP (!) 149/81   Pulse (!) 50   Temp 97.8 F (36.6 C) (Oral)   Ht 5\' 10"  (1.778 m)   Wt 186 lb (84.4 kg)   BMI 26.69 kg/m   Physical Exam  Constitutional: He is oriented to person, place, and time. He appears well-developed and well-nourished. No distress.  HENT:  Mouth/Throat: Oropharynx is clear and moist. No oropharyngeal exudate.  Cardiovascular: Normal rate, regular rhythm and normal heart sounds. Exam reveals no gallop and no friction rub.  No murmur heard.  Pulmonary/Chest: Effort normal and breath sounds normal. No respiratory distress. He has no wheezes.  Abdominal: Soft. Bowel sounds are normal. He exhibits no distension. There is no tenderness.  Lymphadenopathy:  He has no cervical adenopathy.  Neurological: He is alert and oriented to person, place, and time.  Skin: Skin is warm and dry. No rash noted. No erythema.  Psychiatric: He has a normal mood and affect. His behavior is normal.    CBC Lab Results  Component Value Date   WBC 6.0 05/19/2016   RBC 4.41 05/19/2016   HGB 13.4 05/19/2016   HCT 41.4 05/19/2016   PLT 157 05/19/2016   MCV 93.9 05/19/2016   MCH 30.4 05/19/2016   MCHC 32.4 05/19/2016   RDW 14.1 05/19/2016   LYMPHSABS 1.4 12/01/2015   MONOABS 0.6 12/01/2015   EOSABS 0.5 12/01/2015    BMET Lab Results  Component Value Date   NA 135 05/19/2016   K 4.0 05/19/2016   CL 111 05/19/2016   CO2 17 (L) 05/19/2016   GLUCOSE 113 (H) 05/19/2016   BUN 30 (H) 05/19/2016   CREATININE 1.85 (H) 05/19/2016   CALCIUM 9.0 05/19/2016   GFRNONAA 33 (L) 05/19/2016   GFRAA 38 (L) 05/19/2016    Assessment and Plan  Recurrent cdifficile = plan to undergo FMT. He will continue on vancomycin 125mg  QID until 12/18. Have him stop oral vancomycin then do bowel prep and liquid diet on 12/19. Plan for FMT on 12/20 with dr Loletha Carrow. Patient informed of IRB informed  consent and procurement of stool through [facility] for FMT  spent 45 min with patient with greater than 50% in discussion of FMT process and success rate of fecal transplantation for recurrent cdifficile

## 2016-11-15 ENCOUNTER — Telehealth: Payer: Self-pay | Admitting: Gastroenterology

## 2016-11-15 ENCOUNTER — Other Ambulatory Visit: Payer: Self-pay

## 2016-11-15 MED ORDER — NA SULFATE-K SULFATE-MG SULF 17.5-3.13-1.6 GM/177ML PO SOLN: 1.0000 | Freq: Once | ORAL | 0 refills | Status: AC

## 2016-11-15 NOTE — Telephone Encounter (Signed)
Suprep sent to the pharmacy on file tt

## 2016-11-18 ENCOUNTER — Encounter (HOSPITAL_COMMUNITY): Payer: Self-pay | Admitting: *Deleted

## 2016-11-19 ENCOUNTER — Encounter (HOSPITAL_COMMUNITY)
Admission: RE | Disposition: A | Discharge status: Home / Self Care | Payer: Self-pay | Source: Ambulatory Visit | Attending: Gastroenterology

## 2016-11-19 ENCOUNTER — Encounter (HOSPITAL_COMMUNITY): Payer: Self-pay

## 2016-11-19 ENCOUNTER — Encounter (HOSPITAL_COMMUNITY): Payer: Self-pay | Admitting: Anesthesiology

## 2016-11-19 ENCOUNTER — Ambulatory Visit (HOSPITAL_COMMUNITY)
Admission: RE | Admit: 2016-11-19 | Discharge: 2016-11-19 | Disposition: A | Discharge status: Home / Self Care | Payer: Medicare HMO | Source: Ambulatory Visit | Attending: Gastroenterology | Admitting: Gastroenterology

## 2016-11-19 DIAGNOSIS — I10 Essential (primary) hypertension: Secondary | ICD-10-CM | POA: Insufficient documentation

## 2016-11-19 DIAGNOSIS — Z79899 Other long term (current) drug therapy: Secondary | ICD-10-CM | POA: Diagnosis not present

## 2016-11-19 DIAGNOSIS — H3530 Unspecified macular degeneration: Secondary | ICD-10-CM | POA: Insufficient documentation

## 2016-11-19 DIAGNOSIS — A0472 Enterocolitis due to Clostridium difficile, not specified as recurrent: Secondary | ICD-10-CM | POA: Diagnosis not present

## 2016-11-19 DIAGNOSIS — M5030 Other cervical disc degeneration, unspecified cervical region: Secondary | ICD-10-CM | POA: Diagnosis not present

## 2016-11-19 DIAGNOSIS — M109 Gout, unspecified: Secondary | ICD-10-CM | POA: Insufficient documentation

## 2016-11-19 DIAGNOSIS — E669 Obesity, unspecified: Secondary | ICD-10-CM | POA: Diagnosis not present

## 2016-11-19 DIAGNOSIS — J45909 Unspecified asthma, uncomplicated: Secondary | ICD-10-CM | POA: Insufficient documentation

## 2016-11-19 DIAGNOSIS — Z7982 Long term (current) use of aspirin: Secondary | ICD-10-CM | POA: Insufficient documentation

## 2016-11-19 DIAGNOSIS — Z85828 Personal history of other malignant neoplasm of skin: Secondary | ICD-10-CM | POA: Insufficient documentation

## 2016-11-19 DIAGNOSIS — Z86010 Personal history of colonic polyps: Secondary | ICD-10-CM | POA: Diagnosis not present

## 2016-11-19 DIAGNOSIS — R972 Elevated prostate specific antigen [PSA]: Secondary | ICD-10-CM | POA: Diagnosis not present

## 2016-11-19 DIAGNOSIS — K449 Diaphragmatic hernia without obstruction or gangrene: Secondary | ICD-10-CM | POA: Insufficient documentation

## 2016-11-19 DIAGNOSIS — Z6826 Body mass index (BMI) 26.0-26.9, adult: Secondary | ICD-10-CM | POA: Insufficient documentation

## 2016-11-19 DIAGNOSIS — A498 Other bacterial infections of unspecified site: Secondary | ICD-10-CM

## 2016-11-19 DIAGNOSIS — E781 Pure hyperglyceridemia: Secondary | ICD-10-CM | POA: Insufficient documentation

## 2016-11-19 HISTORY — DX: Malignant (primary) neoplasm, unspecified: C80.1

## 2016-11-19 HISTORY — DX: Pneumonia, unspecified organism: J18.9

## 2016-11-19 HISTORY — PX: FECAL TRANSPLANT: SHX6383

## 2016-11-19 HISTORY — PX: COLONOSCOPY WITH PROPOFOL: SHX5780

## 2016-11-19 SURGERY — COLONOSCOPY WITH PROPOFOL
Anesthesia: Moderate Sedation

## 2016-11-19 MED ORDER — MIDAZOLAM HCL 5 MG/ML IJ SOLN
INTRAMUSCULAR | Status: AC
  Filled 2016-11-19: qty 2

## 2016-11-19 MED ORDER — MIDAZOLAM HCL 10 MG/2ML IJ SOLN
INTRAMUSCULAR | Status: DC | PRN
  Administered 2016-11-19: 2 mg via INTRAVENOUS
  Administered 2016-11-19: 1 mg via INTRAVENOUS

## 2016-11-19 MED ORDER — FENTANYL CITRATE (PF) 100 MCG/2ML IJ SOLN
INTRAMUSCULAR | Status: DC | PRN
  Administered 2016-11-19: 25 ug via INTRAVENOUS

## 2016-11-19 MED ORDER — LOPERAMIDE HCL 2 MG PO CAPS
4.0000 mg | ORAL_CAPSULE | Freq: Once | ORAL | Status: AC
  Administered 2016-11-19: 4 mg via ORAL
  Filled 2016-11-19: qty 2

## 2016-11-19 MED ORDER — FENTANYL CITRATE (PF) 100 MCG/2ML IJ SOLN
INTRAMUSCULAR | Status: AC
  Filled 2016-11-19: qty 2

## 2016-11-19 MED ORDER — LACTATED RINGERS IV SOLN
INTRAVENOUS | Status: DC
  Administered 2016-11-19: 1000 mL via INTRAVENOUS

## 2016-11-19 MED ORDER — SODIUM CHLORIDE 0.9 % IV SOLN: INTRAVENOUS | Status: DC

## 2016-11-19 SURGICAL SUPPLY — 1 items: PREP MICROBIOTA FECAL (Tissue) ×1 IMPLANT

## 2016-11-19 NOTE — Op Note (Signed)
[facility] Patient Name: Shane [name] Procedure Date : 11/19/2016 MRN: [id number] Attending MD: Estill Cotta. Loletha Carrow , MD Date of Birth: 04/04/1932 CSN: [id number] Age: 83 Admit Type: Outpatient Procedure:                Colonoscopy Indications:              Clostridium difficile diarrhea (planned fecal                            microbiota transplant) Providers:                Estill Cotta. Loletha Carrow, MD, Cleda Daub, RN, Elspeth Cho Tech., Technician Referring MD:              Medicines:                Midazolam 3 mg IV, Fentanyl 25 micrograms IV Complications:            No immediate complications. Estimated Blood Loss:     Estimated blood loss: none. Procedure:                Pre-Anesthesia Assessment:                           - Prior to the procedure, a History and Physical                            was performed, and patient medications and                            allergies were reviewed. The patient's tolerance of                            previous anesthesia was also reviewed. The risks                            and benefits of the procedure and the sedation                            options and risks were discussed with the patient.                            All questions were answered, and informed consent                            was obtained. Anticoagulants: The patient has taken                            aspirin. It was decided not to withhold this                            medication prior to the procedure. ASA Grade  Assessment: II - A patient with mild systemic                            disease. After reviewing the risks and benefits,                            the patient was deemed in satisfactory condition to                            undergo the procedure.                           After obtaining informed consent, the colonoscope                            was passed under direct vision.  Throughout the                            procedure, the patient's blood pressure, pulse, and                            oxygen saturations were monitored continuously. The                            EC-[id number] ([id number]) scope was introduced through                            the anus and advanced to the the terminal ileum.                            The colonoscopy was performed without difficulty.                            The patient tolerated the procedure well. The                            quality of the bowel preparation was excellent. The                            terminal ileum and the appendiceal orifice were                            photographed. The bowel preparation used was SUPREP. Scope In: 3:18:55 PM Scope Out: 3:29:02 PM Scope Withdrawal Time: 0 hours 4 minutes 0 seconds  Total Procedure Duration: 0 hours 10 minutes 7 seconds  Findings:      The perianal and digital rectal examinations were normal.      The terminal ileum appeared normal. Fecal Microbiota Transplant       (Bacteriotherapy): Donor stool was prepared by Dr Carlyle Basques       (Infectious Disease) using water as per protocol. Approximately 300 mL       of the emulsified donor stool was instilled in the terminal ileum. A       detailed colonoscopic exam could not be performed upon scope withdrawal  secondary to limited visibility from the instilled stool.      The visualized colon appeared normal, as it had on a prior exam. Impression:               - The examined portion of the ileum was normal.                           - The entire examined colon is normal.                           - Fecal Microbiota Transplant (Bacteriotherapy)                            performed in the terminal ileum.                           - No specimens collected. Moderate Sedation:      Moderate (conscious) sedation was administered by the endoscopy nurse       and supervised by the endoscopist. The following  parameters were       monitored: oxygen saturation, heart rate, blood pressure, respiratory       rate, EKG, adequacy of pulmonary ventilation, and response to care.       Total physician intraservice time was 13 minutes. Recommendation:           - Resume previous diet.                           - Continue present medications.                           - No recommendation at this time regarding repeat                            colonoscopy. Procedure Code(s):        --- Professional ---                           [phone], Colonoscopy, flexible; diagnostic, including                            collection of specimen(s) by brushing or washing,                            when performed (separate procedure)                           99152, Moderate sedation services provided by the                            same physician or other qualified health care                            professional performing the diagnostic or                            therapeutic service that the sedation supports,  requiring the presence of an independent trained                            observer to assist in the monitoring of the                            patient's level of consciousness and physiological                            status; initial 15 minutes of intraservice time,                            patient age 81 years or older Diagnosis Code(s):        --- Professional ---                           A04.7, Enterocolitis due to Clostridium difficile CPT copyright 2016 [employer]. All rights reserved. The codes documented in this report are preliminary and upon coder review may  be revised to meet current compliance requirements. [name], MD 11/19/2016 3:44:22 PM This report has been signed electronically. Number of Addenda: 0

## 2016-11-19 NOTE — Interval H&P Note (Signed)
History and Physical Interval Note:  11/19/2016 2:10 PM  Shane [name]  has presented today for surgery, with the diagnosis of Recurrent C. diff.  The various methods of treatment have been discussed with the patient and family. After consideration of risks, benefits and other options for treatment, the patient has consented to  Procedure(s): COLONOSCOPY WITH PROPOFOL (N/A) FECAL TRANSPLANT (N/A) as a surgical intervention .  The patient's history has been reviewed, patient examined, no change in status, stable for surgery.  I have reviewed the patient's chart and labs.  Questions were answered to the patient's satisfaction.     Shane [name]

## 2016-11-19 NOTE — Discharge Instructions (Signed)
YOU HAD AN ENDOSCOPIC PROCEDURE TODAY: Refer to the procedure report and other information in the discharge instructions given to you for any specific questions about what was found during the examination. If this information does not answer your questions, please call [place] office at [phone] to clarify.  ° °YOU SHOULD EXPECT: Some feelings of bloating in the abdomen. Passage of more gas than usual. Walking can help get rid of the air that was put into your GI tract during the procedure and reduce the bloating. If you had a lower endoscopy (such as a colonoscopy or flexible sigmoidoscopy) you may notice spotting of blood in your stool or on the toilet paper. Some abdominal soreness may be present for a day or two, also. ° °DIET: Your first meal following the procedure should be a light meal and then it is ok to progress to your normal diet. A half-sandwich or bowl of soup is an example of a good first meal. Heavy or fried foods are harder to digest and may make you feel nauseous or bloated. Drink plenty of fluids but you should avoid alcoholic beverages for 24 hours. If you had a esophageal dilation, please see attached instructions for diet.   ° °ACTIVITY: Your care partner should take you home directly after the procedure. You should plan to take it easy, moving slowly for the rest of the day. You can resume normal activity the day after the procedure however YOU SHOULD NOT DRIVE, use power tools, machinery or perform tasks that involve climbing or major physical exertion for 24 hours (because of the sedation medicines used during the test).  ° °SYMPTOMS TO REPORT IMMEDIATELY: °A gastroenterologist can be reached at any hour. Please call [phone]  for any of the following symptoms:  °Following lower endoscopy (colonoscopy, flexible sigmoidoscopy) °Excessive amounts of blood in the stool  °Significant tenderness, worsening of abdominal pains  °Swelling of the abdomen that is new, acute  °Fever of 100° or  higher  °Following upper endoscopy (EGD, EUS, ERCP, esophageal dilation) °Vomiting of blood or coffee ground material  °New, significant abdominal pain  °New, significant chest pain or pain under the shoulder blades  °Painful or persistently difficult swallowing  °New shortness of breath  °Black, tarry-looking or red, bloody stools ° °FOLLOW UP:  °If any biopsies were taken you will be contacted by phone or by letter within the next 1-3 weeks. Call [phone]  if you have not heard about the biopsies in 3 weeks.  °Please also call with any specific questions about appointments or follow up tests. ° °

## 2016-11-19 NOTE — H&P (Signed)
History:  This patient presents for endoscopic testing for treatment of recurrent C difficile infection with FMT.  Shane [name] Referring physician: Dorothyann Peng, NP  Past Medical History: Past Medical History:  Diagnosis Date  . Allergy   . Anemia, iron deficiency   . 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 DDD (degenerative disc disease), cervical   . Dysplastic nevus of left upper extremity   . Elevated PSA   . Essential hypertension   . Extrinsic asthma   . GERD (gastroesophageal reflux disease)    no recent  . Gout   . Hiatal hernia   . Macular degeneration   . Pneumonia    2014ish  . Spinal stenosis      Past Surgical History: Past Surgical History:  Procedure Laterality Date  . BACK SURGERY    . Bone Spur Right    Shoulder  . COLONOSCOPY W/ POLYPECTOMY    . SKIN CANCER EXCISION      Allergies: No Known Allergies  Outpatient Meds: Current Facility-Administered Medications  Medication Dose Route Frequency Provider Last Rate Last Dose  . 0.9 %  sodium chloride infusion   Intravenous Continuous [name], MD      . lactated ringers infusion   Intravenous Continuous [name], MD 125 mL/hr at 11/19/16 1341 1,000 mL at 11/19/16 1341      ___________________________________________________________________ Objective   Exam:  BP (!) 144/79   Pulse 73   Temp 97.8 F (36.6 C) (Oral)   Resp 12   Ht 5\' 10"  (1.778 m)   Wt 186 lb (84.4 kg)   SpO2 96%   BMI 26.69 kg/m    CV: RRR without murmur, S1/S2, no JVD, no peripheral edema  Resp: clear to auscultation bilaterally, normal RR and effort noted  GI: soft, no tenderness, with active bowel sounds. No guarding or palpable organomegaly noted.  Neuro: awake, alert and oriented x 3. Normal gross motor function and fluent speech   Assessment:  C difficile  Plan:  Colonoscopy with fecal microbiota transplant.   [name]

## 2016-11-20 ENCOUNTER — Telehealth: Payer: Self-pay | Admitting: Internal Medicine

## 2016-11-20 NOTE — Telephone Encounter (Signed)
Patient doing well this morning after colonoscopy/FMT yesterday. He did have two small watery stools last night but he describes it to be "yellow water" similar to the colon prep.  We will touch base at end of the week to see how he is doing

## 2016-11-26 DIAGNOSIS — Z7982 Long term (current) use of aspirin: Secondary | ICD-10-CM | POA: Diagnosis not present

## 2016-11-26 DIAGNOSIS — N401 Enlarged prostate with lower urinary tract symptoms: Secondary | ICD-10-CM | POA: Diagnosis not present

## 2016-11-26 DIAGNOSIS — Z6826 Body mass index (BMI) 26.0-26.9, adult: Secondary | ICD-10-CM | POA: Diagnosis not present

## 2016-11-26 DIAGNOSIS — Z0000 Encounter for general adult medical examination without abnormal findings: Secondary | ICD-10-CM | POA: Diagnosis not present

## 2016-11-26 DIAGNOSIS — M1A9XX0 Chronic gout, unspecified, without tophus (tophi): Secondary | ICD-10-CM | POA: Diagnosis not present

## 2016-11-26 DIAGNOSIS — Z87891 Personal history of nicotine dependence: Secondary | ICD-10-CM | POA: Diagnosis not present

## 2016-11-26 DIAGNOSIS — I10 Essential (primary) hypertension: Secondary | ICD-10-CM | POA: Diagnosis not present

## 2016-12-21 ENCOUNTER — Other Ambulatory Visit: Payer: Self-pay | Admitting: Adult Health

## 2016-12-24 ENCOUNTER — Other Ambulatory Visit: Payer: Self-pay

## 2016-12-24 MED ORDER — ALLOPURINOL 300 MG PO TABS: 300.0000 mg | ORAL_TABLET | Freq: Every day | ORAL | 0 refills | Status: DC

## 2017-01-06 ENCOUNTER — Telehealth: Payer: Self-pay | Admitting: *Deleted

## 2017-01-06 NOTE — Telephone Encounter (Signed)
1-2 "really" loose stools/day for the past 2-3 weeks.  Had Fecal Transplant, 11/19/16.  Patient concerned about the stools.  Sometimes they smell like they did when he had C. Diff but not every time.  Stools occur first thing in the AM and late afternoon.  The patient would like to talk with Dr. Baxter Flattery. [phone])  RN will route this message to Dr. Baxter Flattery.

## 2017-01-09 NOTE — Telephone Encounter (Signed)
I will call patient to discuss his symptoms

## 2017-01-22 ENCOUNTER — Other Ambulatory Visit: Payer: Self-pay | Admitting: Adult Health

## 2017-01-22 NOTE — Telephone Encounter (Signed)
Ok to refill for 90 days. He is due for physical

## 2017-01-27 ENCOUNTER — Ambulatory Visit (INDEPENDENT_AMBULATORY_CARE_PROVIDER_SITE_OTHER): Payer: Medicare HMO | Admitting: Internal Medicine

## 2017-01-27 ENCOUNTER — Encounter: Payer: Self-pay | Admitting: Internal Medicine

## 2017-01-27 VITALS — BP 123/73 | HR 66 | Temp 97.9°F | Ht 70.0 in | Wt 187.0 lb

## 2017-01-27 DIAGNOSIS — A0471 Enterocolitis due to Clostridium difficile, recurrent: Secondary | ICD-10-CM | POA: Diagnosis not present

## 2017-01-27 NOTE — Progress Notes (Signed)
RFV: follow up from [place]  Patient ID: Shane [name], male   DOB: October 12, 1931, 83 y.o.   MRN: [id number]  HPI Shane [name] is an 83yo M with recurrent cdifficile, who underwent FMT on 2/20 by Dr Loletha Carrow and myself. He states that initial first 2 wk post transplant, he was doing well, back to normal bowel habits. He did notice at the beginning of April where he started to have 2 loose BM per day, not associated with change in diet, nor abdominal cramping, not necessarily having urgency nor watery, overall just different from the day prior without reason. He has taken a total of 4 tab of immodium in the last 3 wk since these episodes have occurred, thus roughly happening once a week. He is wondering if this is his new norm.  No abtx exposure. occ takes probiotics  Outpatient Encounter Prescriptions as of 01/27/2017  Medication Sig  . allopurinol (ZYLOPRIM) 300 MG tablet TAKE 1 TABLET (300 MG TOTAL) BY MOUTH DAILY.  Marland Kitchen aspirin 81 MG EC tablet Take 81 mg by mouth daily.    Marland Kitchen atenolol-chlorthalidone (TENORETIC) 50-25 MG tablet Take 0.5 tablets by mouth daily.  Marland Kitchen dutasteride (AVODART) 0.5 MG capsule TAKE 1 CAPSULE(0.5 MG) BY MOUTH DAILY  . Glucosamine 500 MG CAPS Take 1 capsule by mouth 2 (two) times daily.   . Melatonin 1 MG TABS Take 1-2 mg by mouth at bedtime as needed.  . Multiple Vitamins-Minerals (PRESERVISION AREDS) TABS Take by mouth 2 (two) times daily.  Marland Kitchen saccharomyces boulardii (FLORASTOR) 250 MG capsule Take 1 capsule (250 mg total) by mouth 2 (two) times daily.  . vancomycin (VANCOCIN) 125 MG capsule Take 1 capsule (125 mg total) by mouth 4 (four) times daily. (Patient not taking: Reported on 01/27/2017)   No facility-administered encounter medications on file as of 01/27/2017.      Patient Active Problem List   Diagnosis Date Noted  . Clostridium difficile infection   . Hypertriglyceridemia 12/01/2015  . Actinic keratoses 07/19/2015  . Right tennis elbow 01/10/2014  . Dysplastic nevus  of left upper extremity 03/22/2013  . Gout of big toe 10/15/2012  . EXTRINSIC ASTHMA, UNSPECIFIED 01/11/2009  . SPINAL STENOSIS 05/24/2008  . PROSTATE SPECIFIC ANTIGEN, ELEVATED 05/24/2008  . OBESITY 08/13/2006  . ANEMIA-IRON DEFICIENCY 08/13/2006  . Essential hypertension 08/13/2006  . ALLERGIC RHINITIS 08/13/2006  . GERD 08/13/2006  . HIATAL HERNIA 08/13/2006  . DEGENERATIVE JOINT DISEASE 08/13/2006     There are no preventive care reminders to display for this patient.   Review of Systems  Constitutional: Negative for fever, chills, diaphoresis, activity change, appetite change, fatigue and unexpected weight change.  HENT: Negative for congestion, sore throat, rhinorrhea, sneezing, trouble swallowing and sinus pressure.  Eyes: Negative for photophobia and visual disturbance.  Respiratory: Negative for cough, chest tightness, shortness of breath, wheezing and stridor.  Cardiovascular: Negative for chest pain, palpitations and leg swelling.  Gastrointestinal: occasional loose stools. Negative for nausea, vomiting, abdominal pain, diarrhea, constipation, blood in stool, abdominal distention and anal bleeding.  Genitourinary: Negative for dysuria, hematuria, flank pain and difficulty urinating.  Musculoskeletal: Negative for myalgias, back pain, joint swelling, arthralgias and gait problem.  Skin: Negative for color change, pallor, rash and wound.  Neurological: Negative for dizziness, tremors, weakness and light-headedness.  Hematological: Negative for adenopathy. Does not bruise/bleed easily.  Psychiatric/Behavioral: Negative for behavioral problems, confusion, sleep disturbance, dysphoric mood, decreased concentration and agitation.    Physical Exam   BP 123/73   Pulse 66  Temp 97.9 F (36.6 C) (Oral)   Ht 5\' 10"  (1.778 m)   Wt 187 lb (84.8 kg)   BMI 26.83 kg/m    No exam  CBC Lab Results  Component Value Date   WBC 6.0 05/19/2016   RBC 4.41 05/19/2016   HGB 13.4  05/19/2016   HCT 41.4 05/19/2016   PLT 157 05/19/2016   MCV 93.9 05/19/2016   MCH 30.4 05/19/2016   MCHC 32.4 05/19/2016   RDW 14.1 05/19/2016   LYMPHSABS 1.4 12/01/2015   MONOABS 0.6 12/01/2015   EOSABS 0.5 12/01/2015    BMET Lab Results  Component Value Date   NA 135 05/19/2016   K 4.0 05/19/2016   CL 111 05/19/2016   CO2 17 (L) 05/19/2016   GLUCOSE 113 (H) 05/19/2016   BUN 30 (H) 05/19/2016   CREATININE 1.85 (H) 05/19/2016   CALCIUM 9.0 05/19/2016   GFRNONAA 33 (L) 05/19/2016   GFRAA 38 (L) 05/19/2016    Assessment and Plan  Recurrent cdifficile = recommended to stop probiotics since literature does not support ongoing use. Can use immodium if he only has 1-2 loose stools per day as needed if he notices watery stools 4-6 episodes per day, to give [place] a call to retest for cdifficile. Refrain from abtx use unless needed. Welcomed him to call to check on which abtx he is going to take and if any other alternatives less associated with cdifficile.  Spent 10 min with patient in counseling for cdifficile- in face to face time.

## 2017-03-11 ENCOUNTER — Ambulatory Visit (INDEPENDENT_AMBULATORY_CARE_PROVIDER_SITE_OTHER): Payer: Medicare HMO | Admitting: Adult Health

## 2017-03-11 DIAGNOSIS — R1030 Lower abdominal pain, unspecified: Secondary | ICD-10-CM

## 2017-03-11 NOTE — Progress Notes (Signed)
Subjective:    Patient ID: Shane [name], male    DOB: 12/05/1931, 83 y.o.   MRN: [id number]  HPI  83 year old male who  has a past medical history of Allergy; Anemia, iron deficiency; Cancer ([place]); DDD (degenerative disc disease), cervical; Dysplastic nevus of left upper extremity; Elevated PSA; Essential hypertension; Extrinsic asthma; GERD (gastroesophageal reflux disease); Gout; Hiatal hernia; Macular degeneration; Pneumonia; and Spinal stenosis.  He presents to the office today for the complaint of right groin pain. He reports that the pain has been intermittent for the last 2-3 weeks. He first noticed the  Pain when he was working in the yard. He will feel as though " it will catch sometimes". He played golf late last week and had a nagging pain in his right groin. Does report feeling as though he has a bulge.   Pain has radiated to his right testicle.   CT in 2017 showed " Small fat containing inguinal hernias, larger on the right."  He denies any swelling of his testicle, painful urination, or bruising of his abdomen   Review of Systems See HPI   Past Medical History:  Diagnosis Date  . Allergy   . Anemia, iron deficiency   . 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 DDD (degenerative disc disease), cervical   . Dysplastic nevus of left upper extremity   . Elevated PSA   . Essential hypertension   . Extrinsic asthma   . GERD (gastroesophageal reflux disease)    no recent  . Gout   . Hiatal hernia   . Macular degeneration   . Pneumonia    2014ish  . Spinal stenosis     Social History   Social History  . Marital status: Married    Spouse name: N/A  . Number of children: 3  . Years of education: N/A   Occupational History  . retired    Social History Main Topics  . Smoking status: Former Smoker    Years: 35.00  . Smokeless tobacco: Never Used     Comment: quit late 1980's  . Alcohol use No  . Drug use: No  . Sexual activity: Not on file   Other Topics  Concern  . Not on file   Social History Narrative   Retired from Psychologist, educational    Married for 55 years    Three children all live locally    [place] to play golf and go to [employer].     Past Surgical History:  Procedure Laterality Date  . BACK SURGERY    . Bone Spur Right    Shoulder  . COLONOSCOPY W/ POLYPECTOMY    . COLONOSCOPY WITH PROPOFOL N/A 11/19/2016   Procedure: COLONOSCOPY WITH PROPOFOL;  Surgeon: Doran Stabler, MD;  Location: [facility];  Service: Gastroenterology;  Laterality: N/A;  . FECAL TRANSPLANT N/A 11/19/2016   Procedure: FECAL TRANSPLANT;  Surgeon: Doran Stabler, MD;  Location: [facility];  Service: Gastroenterology;  Laterality: N/A;  . SKIN CANCER EXCISION      Family History  Problem Relation Age of Onset  . Multiple myeloma Brother   . Prostate cancer Brother   . Hypertension Father        ?  . Lung cancer Sister   . Colon cancer Neg Hx   . Stomach cancer Neg Hx     No Known Allergies  Current Outpatient Prescriptions on File Prior to Visit  Medication Sig Dispense Refill  .  allopurinol (ZYLOPRIM) 300 MG tablet TAKE 1 TABLET (300 MG TOTAL) BY MOUTH DAILY. 90 tablet 0  . aspirin 81 MG EC tablet Take 81 mg by mouth daily.      Marland Kitchen atenolol-chlorthalidone (TENORETIC) 50-25 MG tablet Take 0.5 tablets by mouth daily. 50 tablet 3  . dutasteride (AVODART) 0.5 MG capsule TAKE 1 CAPSULE(0.5 MG) BY MOUTH DAILY 90 capsule 3  . Glucosamine 500 MG CAPS Take 1 capsule by mouth 2 (two) times daily.     . Melatonin 1 MG TABS Take 1-2 mg by mouth at bedtime as needed.    . Multiple Vitamins-Minerals (PRESERVISION AREDS) TABS Take by mouth 2 (two) times daily.     No current facility-administered medications on file prior to visit.     There were no vitals taken for this visit.      Objective:   Physical Exam  Constitutional: He is oriented to person, place, and time. He appears well-developed and well-nourished. No distress.  Cardiovascular:  Normal rate, regular rhythm, normal heart sounds and intact distal pulses.  Exam reveals no gallop.   No murmur heard. Pulmonary/Chest: Effort normal and breath sounds normal. No respiratory distress. He has no wheezes. He has no rales. He exhibits no tenderness.  Abdominal: A hernia (umbilical ) is present. Hernia confirmed positive in the right inguinal area.  Genitourinary: Testes normal. Right testis shows no mass, no swelling and no tenderness. Left testis shows no mass and no swelling.  Neurological: He is alert and oriented to person, place, and time.  Skin: Skin is warm and dry. No rash noted. He is not diaphoretic. No erythema. No pallor.  Psychiatric: He has a normal mood and affect. His behavior is normal. Judgment and thought content normal.  Nursing note and vitals reviewed.     Assessment & Plan:  1. Inguinal pain, unspecified laterality + right inguinal hernia. Due to pain I will send to Gen Surg to be evaluated.  - Ambulatory referral to General Surgery - Go to the ER for intense pain, no bowel movements or bruising to the abdomen   Dorothyann Peng, NP

## 2017-03-31 DIAGNOSIS — L918 Other hypertrophic disorders of the skin: Secondary | ICD-10-CM | POA: Diagnosis not present

## 2017-03-31 DIAGNOSIS — K4020 Bilateral inguinal hernia, without obstruction or gangrene, not specified as recurrent: Secondary | ICD-10-CM | POA: Diagnosis not present

## 2017-03-31 DIAGNOSIS — K429 Umbilical hernia without obstruction or gangrene: Secondary | ICD-10-CM | POA: Diagnosis not present

## 2017-04-22 ENCOUNTER — Telehealth: Payer: Self-pay | Admitting: Family Medicine

## 2017-04-22 NOTE — Telephone Encounter (Signed)
Spoke to [place] at the pharmacy and instructed her to split medication for 90 days.

## 2017-04-22 NOTE — Telephone Encounter (Signed)
Received a fax from [facility].  Atenolol-chlorthalidone combo tablet is on back order.  Ok to split.  [facility] [address] [phone]

## 2017-04-22 NOTE — Telephone Encounter (Signed)
Ok to split

## 2017-04-24 ENCOUNTER — Other Ambulatory Visit: Payer: Self-pay | Admitting: Adult Health

## 2017-04-28 ENCOUNTER — Other Ambulatory Visit: Payer: Self-pay

## 2017-04-28 MED ORDER — ATENOLOL-CHLORTHALIDONE 50-25 MG PO TABS: 0.5000 | ORAL_TABLET | Freq: Every day | ORAL | 3 refills | Status: DC

## 2017-04-29 NOTE — Telephone Encounter (Signed)
Sent to the pharmacy by e-scribe.  Pt scheduled for yearly on 05/02/17

## 2017-05-02 ENCOUNTER — Encounter: Payer: Self-pay | Admitting: Adult Health

## 2017-05-02 ENCOUNTER — Ambulatory Visit (INDEPENDENT_AMBULATORY_CARE_PROVIDER_SITE_OTHER): Payer: Medicare HMO | Admitting: Adult Health

## 2017-05-02 VITALS — BP 126/72 | Temp 97.7°F | Ht 69.75 in | Wt 185.0 lb

## 2017-05-02 DIAGNOSIS — R972 Elevated prostate specific antigen [PSA]: Secondary | ICD-10-CM

## 2017-05-02 DIAGNOSIS — I10 Essential (primary) hypertension: Secondary | ICD-10-CM | POA: Diagnosis not present

## 2017-05-02 DIAGNOSIS — Z125 Encounter for screening for malignant neoplasm of prostate: Secondary | ICD-10-CM | POA: Diagnosis not present

## 2017-05-02 DIAGNOSIS — Z0000 Encounter for general adult medical examination without abnormal findings: Secondary | ICD-10-CM

## 2017-05-02 DIAGNOSIS — E781 Pure hyperglyceridemia: Secondary | ICD-10-CM | POA: Diagnosis not present

## 2017-05-02 LAB — CBC WITH DIFFERENTIAL/PLATELET
Basophils Absolute: 0 10*3/uL (ref 0.0–0.1)
Basophils Relative: 0.4 % (ref 0.0–3.0)
EOS ABS: 0.5 10*3/uL (ref 0.0–0.7)
EOS PCT: 7.6 % — AB (ref 0.0–5.0)
HCT: 39.7 % (ref 39.0–52.0)
Hemoglobin: 12.9 g/dL — ABNORMAL LOW (ref 13.0–17.0)
LYMPHS ABS: 1.5 10*3/uL (ref 0.7–4.0)
Lymphocytes Relative: 22.6 % (ref 12.0–46.0)
MCHC: 32.4 g/dL (ref 30.0–36.0)
MCV: 95.5 fl (ref 78.0–100.0)
MONO ABS: 0.6 10*3/uL (ref 0.1–1.0)
Monocytes Relative: 8.8 % (ref 3.0–12.0)
NEUTROS PCT: 60.6 % (ref 43.0–77.0)
Neutro Abs: 4.1 10*3/uL (ref 1.4–7.7)
PLATELETS: 163 10*3/uL (ref 150.0–400.0)
RBC: 4.16 Mil/uL — ABNORMAL LOW (ref 4.22–5.81)
RDW: 14.8 % (ref 11.5–15.5)
WBC: 6.8 10*3/uL (ref 4.0–10.5)

## 2017-05-02 LAB — BASIC METABOLIC PANEL
BUN: 42 mg/dL — AB (ref 6–23)
CHLORIDE: 108 meq/L (ref 96–112)
CO2: 26 mEq/L (ref 19–32)
CREATININE: 1.65 mg/dL — AB (ref 0.40–1.50)
Calcium: 8.9 mg/dL (ref 8.4–10.5)
GFR: 42.57 mL/min — ABNORMAL LOW (ref >60.00)
GLUCOSE: 95 mg/dL (ref 70–99)
POTASSIUM: 4.8 meq/L (ref 3.5–5.1)
Sodium: 140 mEq/L (ref 135–145)

## 2017-05-02 LAB — LIPID PANEL
CHOL/HDL RATIO: 5
CHOLESTEROL: 176 mg/dL (ref 0–200)
HDL: 37.8 mg/dL — AB (ref >39.00)
LDL CALC: 119 mg/dL — AB (ref 0–99)
NonHDL: 138.61
TRIGLYCERIDES: 100 mg/dL (ref 0.0–149.0)
VLDL: 20 mg/dL (ref 0.0–40.0)

## 2017-05-02 LAB — HEPATIC FUNCTION PANEL
ALT: 15 U/L (ref 0–53)
AST: 24 U/L (ref 0–37)
Albumin: 3.8 g/dL (ref 3.5–5.2)
Alkaline Phosphatase: 25 U/L — ABNORMAL LOW (ref 39–117)
BILIRUBIN TOTAL: 0.8 mg/dL (ref 0.2–1.2)
Bilirubin, Direct: 0.1 mg/dL (ref 0.0–0.3)
Total Protein: 6.3 g/dL (ref 6.0–8.3)

## 2017-05-02 LAB — TSH: TSH: 2.31 u[IU]/mL (ref 0.35–4.50)

## 2017-05-02 LAB — PSA: PSA: 3.4 ng/mL (ref 0.10–4.00)

## 2017-05-02 NOTE — Patient Instructions (Signed)
It was great seeing you today   I will call you about your blood work when I get it back today   I wish you the best of luck on the upcoming surgery. Please let me know if you need anything   Health Maintenance, Male A healthy lifestyle and preventative care can promote health and wellness.  Maintain regular health, dental, and eye exams.  Eat a healthy diet. Foods like vegetables, fruits, whole grains, low-fat dairy products, and lean protein foods contain the nutrients you need and are low in calories. Decrease your intake of foods high in solid fats, added sugars, and salt. Get information about a proper diet from your health care provider, if necessary.  Regular physical exercise is one of the most important things you can do for your health. Most adults should get at least 150 minutes of moderate-intensity exercise (any activity that increases your heart rate and causes you to sweat) each week. In addition, most adults need muscle-strengthening exercises on 2 or more days a week.   Maintain a healthy weight. The body mass index (BMI) is a screening tool to identify possible weight problems. It provides an estimate of body fat based on height and weight. Your health care provider can find your BMI and can help you achieve or maintain a healthy weight. For males 20 years and older:  A BMI below 18.5 is considered underweight.  A BMI of 18.5 to 24.9 is normal.  A BMI of 25 to 29.9 is considered overweight.  A BMI of 30 and above is considered obese.  Maintain normal blood lipids and cholesterol by exercising and minimizing your intake of saturated fat. Eat a balanced diet with plenty of fruits and vegetables. Blood tests for lipids and cholesterol should begin at age 11 and be repeated every 5 years. If your lipid or cholesterol levels are high, you are over age 87, or you are at high risk for heart disease, you may need your cholesterol levels checked more frequently.Ongoing high lipid  and cholesterol levels should be treated with medicines if diet and exercise are not working.  If you smoke, find out from your health care provider how to quit. If you do not use tobacco, do not start.  Lung cancer screening is recommended for adults aged 55-80 years who are at high risk for developing lung cancer because of a history of smoking. A yearly low-dose CT scan of the lungs is recommended for people who have at least a 30-pack-year history of smoking and are current smokers or have quit within the past 15 years. A pack year of smoking is smoking an average of 1 pack of cigarettes a day for 1 year (for example, a 30-pack-year history of smoking could mean smoking 1 pack a day for 30 years or 2 packs a day for 15 years). Yearly screening should continue until the smoker has stopped smoking for at least 15 years. Yearly screening should be stopped for people who develop a health problem that would prevent them from having lung cancer treatment.  If you choose to drink alcohol, do not have more than 2 drinks per day. One drink is considered to be 12 oz (360 mL) of beer, 5 oz (150 mL) of wine, or 1.5 oz (45 mL) of liquor.  Avoid the use of street drugs. Do not share needles with anyone. Ask for help if you need support or instructions about stopping the use of drugs.  High blood pressure causes heart disease  and increases the risk of stroke. High blood pressure is more likely to develop in:  People who have blood pressure in the end of the normal range (100-139/85-89 mm Hg).  People who are overweight or obese.  People who are African American.  If you are 60-40 years of age, have your blood pressure checked every 3-5 years. If you are 78 years of age or older, have your blood pressure checked every year. You should have your blood pressure measured twice--once when you are at a hospital or clinic, and once when you are not at a hospital or clinic. Record the average of the two measurements.  To check your blood pressure when you are not at a hospital or clinic, you can use:  An automated blood pressure machine at a pharmacy.  A home blood pressure monitor.  If you are 70-60 years old, ask your health care provider if you should take aspirin to prevent heart disease.  Diabetes screening involves taking a blood sample to check your fasting blood sugar level. This should be done once every 3 years after age 16 if you are at a normal weight and without risk factors for diabetes. Testing should be considered at a younger age or be carried out more frequently if you are overweight and have at least 1 risk factor for diabetes.  Colorectal cancer can be detected and often prevented. Most routine colorectal cancer screening begins at the age of 89 and continues through age 9. However, your health care provider may recommend screening at an earlier age if you have risk factors for colon cancer. On a yearly basis, your health care provider may provide home test kits to check for hidden blood in the stool. A small camera at the end of a tube may be used to directly examine the colon (sigmoidoscopy or colonoscopy) to detect the earliest forms of colorectal cancer. Talk to your health care provider about this at age 46 when routine screening begins. A direct exam of the colon should be repeated every 5-10 years through age 45, unless early forms of precancerous polyps or small growths are found.  People who are at an increased risk for hepatitis B should be screened for this virus. You are considered at high risk for hepatitis B if:  You were born in a country where hepatitis B occurs often. Talk with your health care provider about which countries are considered high risk.  Your parents were born in a high-risk country and you have not received a shot to protect against hepatitis B (hepatitis B vaccine).  You have HIV or AIDS.  You use needles to inject street drugs.  You live with, or have  sex with, someone who has hepatitis B.  You are a man who has sex with other men (MSM).  You get hemodialysis treatment.  You take certain medicines for conditions like cancer, organ transplantation, and autoimmune conditions.  Hepatitis C blood testing is recommended for all people born from [birth year] through 1963 and any individual with known risk factors for hepatitis C.  Healthy men should no longer receive prostate-specific antigen (PSA) blood tests as part of routine cancer screening. Talk to your health care provider about prostate cancer screening.  Testicular cancer screening is not recommended for adolescents or adult males who have no symptoms. Screening includes self-exam, a health care provider exam, and other screening tests. Consult with your health care provider about any symptoms you have or any concerns you have about testicular cancer.  Practice safe sex. Use condoms and avoid high-risk sexual practices to reduce the spread of sexually transmitted infections (STIs).  You should be screened for STIs, including gonorrhea and chlamydia if:  You are sexually active and are younger than 24 years.  You are older than 24 years, and your health care provider tells you that you are at risk for this type of infection.  Your sexual activity has changed since you were last screened, and you are at an increased risk for chlamydia or gonorrhea. Ask your health care provider if you are at risk.  If you are at risk of being infected with HIV, it is recommended that you take a prescription medicine daily to prevent HIV infection. This is called pre-exposure prophylaxis (PrEP). You are considered at risk if:  You are a man who has sex with other men (MSM).  You are a heterosexual man who is sexually active with multiple partners.  You take drugs by injection.  You are sexually active with a partner who has HIV.  Talk with your health care provider about whether you are at high risk of  being infected with HIV. If you choose to begin PrEP, you should first be tested for HIV. You should then be tested every 3 months for as long as you are taking PrEP.  Use sunscreen. Apply sunscreen liberally and repeatedly throughout the day. You should seek shade when your shadow is shorter than you. Protect yourself by wearing long sleeves, pants, a wide-brimmed hat, and sunglasses year round whenever you are outdoors.  Tell your health care provider of new moles or changes in moles, especially if there is a change in shape or color. Also, tell your health care provider if a mole is larger than the size of a pencil eraser.  A one-time screening for abdominal aortic aneurysm (AAA) and surgical repair of large AAAs by ultrasound is recommended for men aged 6-75 years who are current or former smokers.  Stay current with your vaccines (immunizations).   This information is not intended to replace advice given to you by your health care provider. Make sure you discuss any questions you have with your health care provider.   Document Released: 03/14/2008 Document Revised: 10/07/2014 Document Reviewed: 02/11/2011 Elsevier Interactive Patient Education [employer].

## 2017-05-02 NOTE — Progress Notes (Signed)
Subjective:    Patient ID: Shane [name], male    DOB: 1932/04/10, 83 y.o.   MRN: [id number]  HPI  Patient presents for yearly preventative medicine examination. He is a pleasant 83 year old male who  has a past medical history of Allergy; Anemia, iron deficiency; Cancer ([place]); DDD (degenerative disc disease), cervical; Dysplastic nevus of left upper extremity; Elevated PSA; Essential hypertension; Extrinsic asthma; GERD (gastroesophageal reflux disease); Gout; Hiatal hernia; Macular degeneration; Pneumonia; and Spinal stenosis.   All immunizations and health maintenance protocols were reviewed with the patient and needed orders were placed.  Appropriate screening laboratory values were ordered for the patient including screening of hyperlipidemia, renal function and hepatic function. If indicated by BPH, a PSA was ordered.  Medication reconciliation,  past medical history, social history, problem list and allergies were reviewed in detail with the patient  Goals were established with regard to weight loss, exercise, and  diet in compliance with medications  End of life planning was discussed. He does not have an advanced directive or living will. He does not want one  He takes Allopurinol 300 mg daily for prevention of gout   He takes Atenolol 50 mg and Chlorthalidone 25 mg for control of hypertension   He has been seen by his eye doctor and dermatologist this year.   He reports that he is having a surgery for his umbilical and inguinal hernias in 4 days.   Review of Systems  Constitutional: Negative.   HENT: Negative.   Eyes: Negative.   Respiratory: Negative.   Cardiovascular: Negative.   Gastrointestinal: Negative.   Endocrine: Negative.   Genitourinary: Negative.   Musculoskeletal: Negative.   Skin: Negative.   Allergic/Immunologic: Negative.   Neurological: Negative.   Hematological: Negative.   Psychiatric/Behavioral: Negative.   All other systems reviewed and are  negative.  Past Medical History:  Diagnosis Date  . Allergy   . Anemia, iron deficiency   . 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 DDD (degenerative disc disease), cervical   . Dysplastic nevus of left upper extremity   . Elevated PSA   . Essential hypertension   . Extrinsic asthma   . GERD (gastroesophageal reflux disease)    no recent  . Gout   . Hiatal hernia   . Macular degeneration   . Pneumonia    2014ish  . Spinal stenosis     Social History   Social History  . Marital status: Married    Spouse name: N/A  . Number of children: 3  . Years of education: N/A   Occupational History  . retired    Social History Main Topics  . Smoking status: Former Smoker    Years: 35.00  . Smokeless tobacco: Never Used     Comment: quit late 1980's  . Alcohol use No  . Drug use: No  . Sexual activity: Not on file   Other Topics Concern  . Not on file   Social History Narrative   Retired from Psychologist, educational    Married for 55 years    Three children all live locally    [place] to play golf and go to [employer].     Past Surgical History:  Procedure Laterality Date  . BACK SURGERY    . Bone Spur Right    Shoulder  . COLONOSCOPY W/ POLYPECTOMY    . COLONOSCOPY WITH PROPOFOL N/A 11/19/2016   Procedure: COLONOSCOPY WITH PROPOFOL;  Surgeon: [name],  MD;  Location: [facility];  Service: Gastroenterology;  Laterality: N/A;  . FECAL TRANSPLANT N/A 11/19/2016   Procedure: FECAL TRANSPLANT;  Surgeon: Doran Stabler, MD;  Location: [facility];  Service: Gastroenterology;  Laterality: N/A;  . SKIN CANCER EXCISION      Family History  Problem Relation Age of Onset  . Multiple myeloma Brother   . Prostate cancer Brother   . Hypertension Father        ?  . Lung cancer Sister   . Colon cancer Neg Hx   . Stomach cancer Neg Hx     No Known Allergies  Current Outpatient Prescriptions on File Prior to Visit  Medication Sig Dispense Refill  . allopurinol  (ZYLOPRIM) 300 MG tablet TAKE 1 TABLET (300 MG TOTAL) BY MOUTH DAILY. 90 tablet 0  . aspirin 81 MG EC tablet Take 81 mg by mouth daily.      Marland Kitchen dutasteride (AVODART) 0.5 MG capsule TAKE 1 CAPSULE(0.5 MG) BY MOUTH DAILY 90 capsule 3  . Glucosamine 500 MG CAPS Take 1 capsule by mouth 2 (two) times daily.     . Melatonin 1 MG TABS Take 1-2 mg by mouth at bedtime as needed.    . Multiple Vitamins-Minerals (PRESERVISION AREDS) TABS Take by mouth 2 (two) times daily.     No current facility-administered medications on file prior to visit.     BP 126/72 (BP Location: Left Arm)   Temp 97.7 F (36.5 C) (Oral)   Ht 5' 9.75" (1.772 m)   Wt 185 lb (83.9 kg)   BMI 26.74 kg/m       Objective:   Physical Exam  Constitutional: He is oriented to person, place, and time. He appears well-developed and well-nourished. No distress.  HENT:  Head: Normocephalic and atraumatic.  Right Ear: External ear normal.  Left Ear: External ear normal.  Nose: Nose normal.  Mouth/Throat: Oropharynx is clear and moist. No oropharyngeal exudate.  Eyes: Pupils are equal, round, and reactive to light. Conjunctivae and EOM are normal. Right eye exhibits no discharge. Left eye exhibits no discharge. No scleral icterus.  Neck: Normal range of motion. Neck supple. No JVD present. Carotid bruit is not present. No tracheal deviation present. No thyroid mass and no thyromegaly present.  Cardiovascular: Normal rate, regular rhythm, normal heart sounds and intact distal pulses.  Exam reveals no gallop and no friction rub.   No murmur heard. Pulmonary/Chest: Effort normal and breath sounds normal. No stridor. No respiratory distress. He has no wheezes. He has no rales. He exhibits no tenderness.  Abdominal: Soft. Normal appearance and bowel sounds are normal. He exhibits no distension and no mass. There is no tenderness. There is no rebound and no guarding. A hernia (umbilical ) is present. Hernia confirmed positive in the right  inguinal area.  Musculoskeletal: Normal range of motion. He exhibits no edema, tenderness or deformity.  Lymphadenopathy:    He has no cervical adenopathy.  Neurological: He is alert and oriented to person, place, and time. He has normal reflexes. He displays normal reflexes. No cranial nerve deficit. He exhibits normal muscle tone. Coordination normal.  Skin: Skin is warm and dry. No rash noted. He is not diaphoretic. No erythema. No pallor.  Psychiatric: He has a normal mood and affect. His behavior is normal. Judgment and thought content normal.  Nursing note and vitals reviewed.     Assessment & Plan:  1. Routine general medical examination at a health care facility - Continue  to eat healthy and exercise  - Explained the importance of advanced directive and living will.  - Follow up in one year or sooner if needed - Basic metabolic panel - CBC with Differential/Platelet - Hepatic function panel - Lipid panel - TSH - PSA  2. Essential hypertension - Well controlled on current medication  - Basic metabolic panel - CBC with Differential/Platelet - Hepatic function panel - Lipid panel - TSH - atenolol (TENORMIN) 25 MG tablet; Take 25 mg by mouth daily.; Refill: 1 - chlorthalidone (HYGROTON) 25 MG tablet; Take 12.5 mg by mouth daily.; Refill: 1  3. Hypertriglyceridemia  - Basic metabolic panel - CBC with Differential/Platelet - Hepatic function panel - Lipid panel - TSH - Consider statin  4. PROSTATE SPECIFIC ANTIGEN, ELEVATED   - PSA   Dorothyann Peng, NP

## 2017-05-06 ENCOUNTER — Other Ambulatory Visit: Payer: Self-pay | Admitting: General Surgery

## 2017-05-06 DIAGNOSIS — L821 Other seborrheic keratosis: Secondary | ICD-10-CM | POA: Diagnosis not present

## 2017-05-06 DIAGNOSIS — K429 Umbilical hernia without obstruction or gangrene: Secondary | ICD-10-CM | POA: Diagnosis not present

## 2017-05-06 DIAGNOSIS — K4020 Bilateral inguinal hernia, without obstruction or gangrene, not specified as recurrent: Secondary | ICD-10-CM | POA: Diagnosis not present

## 2017-05-06 DIAGNOSIS — L918 Other hypertrophic disorders of the skin: Secondary | ICD-10-CM | POA: Diagnosis not present

## 2017-06-20 ENCOUNTER — Encounter: Payer: Self-pay | Admitting: Adult Health

## 2017-07-17 ENCOUNTER — Other Ambulatory Visit: Payer: Self-pay | Admitting: Adult Health

## 2017-07-17 NOTE — Telephone Encounter (Signed)
Sent to the pharmacy by e-scribe. 

## 2017-07-19 DIAGNOSIS — J988 Other specified respiratory disorders: Secondary | ICD-10-CM | POA: Diagnosis not present

## 2017-07-19 DIAGNOSIS — Z8619 Personal history of other infectious and parasitic diseases: Secondary | ICD-10-CM | POA: Diagnosis not present

## 2017-07-19 DIAGNOSIS — R05 Cough: Secondary | ICD-10-CM | POA: Diagnosis not present

## 2017-07-19 DIAGNOSIS — R0989 Other specified symptoms and signs involving the circulatory and respiratory systems: Secondary | ICD-10-CM | POA: Diagnosis not present

## 2017-07-20 DIAGNOSIS — J988 Other specified respiratory disorders: Secondary | ICD-10-CM | POA: Diagnosis not present

## 2017-07-27 ENCOUNTER — Other Ambulatory Visit: Payer: Self-pay | Admitting: Adult Health

## 2017-07-29 ENCOUNTER — Ambulatory Visit (INDEPENDENT_AMBULATORY_CARE_PROVIDER_SITE_OTHER): Payer: Medicare HMO | Admitting: *Deleted

## 2017-07-29 DIAGNOSIS — R05 Cough: Secondary | ICD-10-CM | POA: Diagnosis not present

## 2017-07-29 DIAGNOSIS — Z23 Encounter for immunization: Secondary | ICD-10-CM

## 2017-07-29 DIAGNOSIS — J988 Other specified respiratory disorders: Secondary | ICD-10-CM | POA: Diagnosis not present

## 2017-07-29 NOTE — Telephone Encounter (Signed)
Sent to the pharmacy by e-scribe. 

## 2017-09-12 DIAGNOSIS — L218 Other seborrheic dermatitis: Secondary | ICD-10-CM | POA: Diagnosis not present

## 2017-09-12 DIAGNOSIS — D1801 Hemangioma of skin and subcutaneous tissue: Secondary | ICD-10-CM | POA: Diagnosis not present

## 2017-09-12 DIAGNOSIS — L821 Other seborrheic keratosis: Secondary | ICD-10-CM | POA: Diagnosis not present

## 2017-09-12 DIAGNOSIS — L814 Other melanin hyperpigmentation: Secondary | ICD-10-CM | POA: Diagnosis not present

## 2017-09-12 DIAGNOSIS — L820 Inflamed seborrheic keratosis: Secondary | ICD-10-CM | POA: Diagnosis not present

## 2017-09-12 DIAGNOSIS — D225 Melanocytic nevi of trunk: Secondary | ICD-10-CM | POA: Diagnosis not present

## 2017-09-12 DIAGNOSIS — Z85828 Personal history of other malignant neoplasm of skin: Secondary | ICD-10-CM | POA: Diagnosis not present

## 2017-09-12 DIAGNOSIS — D2272 Melanocytic nevi of left lower limb, including hip: Secondary | ICD-10-CM | POA: Diagnosis not present

## 2017-09-12 DIAGNOSIS — D2262 Melanocytic nevi of left upper limb, including shoulder: Secondary | ICD-10-CM | POA: Diagnosis not present

## 2017-09-12 DIAGNOSIS — L570 Actinic keratosis: Secondary | ICD-10-CM | POA: Diagnosis not present

## 2017-09-19 DIAGNOSIS — H35372 Puckering of macula, left eye: Secondary | ICD-10-CM | POA: Diagnosis not present

## 2017-09-19 DIAGNOSIS — H353212 Exudative age-related macular degeneration, right eye, with inactive choroidal neovascularization: Secondary | ICD-10-CM | POA: Diagnosis not present

## 2017-09-19 DIAGNOSIS — H353123 Nonexudative age-related macular degeneration, left eye, advanced atrophic without subfoveal involvement: Secondary | ICD-10-CM | POA: Diagnosis not present

## 2017-09-19 DIAGNOSIS — H353114 Nonexudative age-related macular degeneration, right eye, advanced atrophic with subfoveal involvement: Secondary | ICD-10-CM | POA: Diagnosis not present

## 2017-10-07 ENCOUNTER — Emergency Department (HOSPITAL_COMMUNITY): Payer: Medicare HMO

## 2017-10-07 ENCOUNTER — Other Ambulatory Visit: Payer: Self-pay

## 2017-10-07 ENCOUNTER — Encounter (HOSPITAL_COMMUNITY): Payer: Self-pay | Admitting: Emergency Medicine

## 2017-10-07 ENCOUNTER — Emergency Department (HOSPITAL_COMMUNITY)
Admission: EM | Admit: 2017-10-07 | Discharge: 2017-10-08 | Disposition: A | Discharge status: Home / Self Care | Payer: Medicare HMO | Attending: Emergency Medicine | Admitting: Emergency Medicine

## 2017-10-07 DIAGNOSIS — W0110XA Fall on same level from slipping, tripping and stumbling with subsequent striking against unspecified object, initial encounter: Secondary | ICD-10-CM | POA: Insufficient documentation

## 2017-10-07 DIAGNOSIS — R1010 Upper abdominal pain, unspecified: Secondary | ICD-10-CM | POA: Diagnosis present

## 2017-10-07 DIAGNOSIS — J45909 Unspecified asthma, uncomplicated: Secondary | ICD-10-CM | POA: Insufficient documentation

## 2017-10-07 DIAGNOSIS — Y9301 Activity, walking, marching and hiking: Secondary | ICD-10-CM | POA: Insufficient documentation

## 2017-10-07 DIAGNOSIS — I10 Essential (primary) hypertension: Secondary | ICD-10-CM | POA: Insufficient documentation

## 2017-10-07 DIAGNOSIS — Y929 Unspecified place or not applicable: Secondary | ICD-10-CM | POA: Diagnosis not present

## 2017-10-07 DIAGNOSIS — Y999 Unspecified external cause status: Secondary | ICD-10-CM | POA: Diagnosis not present

## 2017-10-07 DIAGNOSIS — R0781 Pleurodynia: Secondary | ICD-10-CM | POA: Diagnosis not present

## 2017-10-07 DIAGNOSIS — R109 Unspecified abdominal pain: Secondary | ICD-10-CM | POA: Diagnosis not present

## 2017-10-07 NOTE — ED Notes (Signed)
Pt st's he was in his yard and his feet got tangled up causing him to fall backwards.  Pt c/o bil. Rib pain.  Pt denies hitting his head.

## 2017-10-07 NOTE — ED Notes (Signed)
When asked by Dorothea Ogle, [place], pt unsure if he hit his head and unsure of LOC. Fall unwitnessed.

## 2017-10-07 NOTE — ED Triage Notes (Signed)
Pt c/o back and bilateral rib pain after falling backward today. Denies hitting head or LOC. Denies shortness of breath, pain worse with palpation.

## 2017-10-07 NOTE — ED Provider Notes (Signed)
Patient placed in Quick Look pathway, seen and evaluated for chief complaint of mechanical fall backwards PTA.  Pertinent H&P findings include anterior lower rib cage pain bilaterally. No abdominal pain. No N/V. No head trauma. No LOC. No CP/SOB.  Based on initial evaluation, labs are not indicated and radiology studies are indicated.  Patient counseled on process, plan, and necessity for staying for completing the evaluation.    Shary Decamp, PA-C 10/07/17 1721    Pattricia Boss, MD 10/08/17 [phone]

## 2017-10-08 DIAGNOSIS — I10 Essential (primary) hypertension: Secondary | ICD-10-CM | POA: Diagnosis not present

## 2017-10-08 DIAGNOSIS — R1010 Upper abdominal pain, unspecified: Secondary | ICD-10-CM | POA: Diagnosis not present

## 2017-10-08 DIAGNOSIS — J45909 Unspecified asthma, uncomplicated: Secondary | ICD-10-CM | POA: Diagnosis not present

## 2017-10-08 DIAGNOSIS — W0110XA Fall on same level from slipping, tripping and stumbling with subsequent striking against unspecified object, initial encounter: Secondary | ICD-10-CM | POA: Diagnosis not present

## 2017-10-08 MED ORDER — HYDROCODONE-ACETAMINOPHEN 5-325 MG PO TABS
1.0000 | ORAL_TABLET | Freq: Once | ORAL | Status: AC
  Administered 2017-10-08: 1 via ORAL
  Filled 2017-10-08: qty 1

## 2017-10-08 NOTE — ED Provider Notes (Signed)
[facility] EMERGENCY DEPARTMENT Provider Note   CSN: [id number] Arrival date & time: 10/07/17  1624     History   Chief Complaint Chief Complaint  Patient presents with  . Fall    HPI Shane [name] is a 84 y.o. male.  Patient reports he fell backwards while trying to place a piece of yard equipment into his shed. He landed on his buttocks initially. He denies hitting his head. No loss of consciousness. He is currently complaining of pain along the muscles of the lower anterior rib margins. Pain increased with certain positions and movement.   The history is provided by the patient. No language interpreter was used.  Fall  This is a new problem. The current episode started 6 to 12 hours ago. Associated symptoms include chest pain. The symptoms are aggravated by bending and coughing. Treatments tried: ibuprofen. The treatment provided no relief.    Past Medical History:  Diagnosis Date  . Allergy   . Anemia, iron deficiency   . 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 DDD (degenerative disc disease), cervical   . Dysplastic nevus of left upper extremity   . Elevated PSA   . Essential hypertension   . Extrinsic asthma   . GERD (gastroesophageal reflux disease)    no recent  . Gout   . Hiatal hernia   . Macular degeneration   . Pneumonia    2014ish  . Spinal stenosis     Patient Active Problem List   Diagnosis Date Noted  . Clostridium difficile infection   . Hypertriglyceridemia 12/01/2015  . EXTRINSIC ASTHMA, UNSPECIFIED 01/11/2009  . SPINAL STENOSIS 05/24/2008  . PROSTATE SPECIFIC ANTIGEN, ELEVATED 05/24/2008  . OBESITY 08/13/2006  . ANEMIA-IRON DEFICIENCY 08/13/2006  . Essential hypertension 08/13/2006  . ALLERGIC RHINITIS 08/13/2006  . GERD 08/13/2006  . HIATAL HERNIA 08/13/2006  . DEGENERATIVE JOINT DISEASE 08/13/2006    Past Surgical History:  Procedure Laterality Date  . BACK SURGERY    . Bone Spur Right    Shoulder  .  COLONOSCOPY W/ POLYPECTOMY    . COLONOSCOPY WITH PROPOFOL N/A 11/19/2016   Procedure: COLONOSCOPY WITH PROPOFOL;  Surgeon: Doran Stabler, MD;  Location: [facility];  Service: Gastroenterology;  Laterality: N/A;  . FECAL TRANSPLANT N/A 11/19/2016   Procedure: FECAL TRANSPLANT;  Surgeon: Doran Stabler, MD;  Location: [facility];  Service: Gastroenterology;  Laterality: N/A;  . SKIN CANCER EXCISION         Home Medications    Prior to Admission medications   Medication Sig Start Date End Date Taking? Authorizing Provider  acetaminophen (TYLENOL) 500 MG tablet Take 500-1,000 mg by mouth every 6 (six) hours as needed (for pain).   Yes [provider]  allopurinol (ZYLOPRIM) 300 MG tablet TAKE 1 TABLET (300 MG TOTAL) BY MOUTH DAILY. 07/29/17  Yes Nafziger, Tommi Rumps, NP  atenolol (TENORMIN) 25 MG tablet Take 25 mg by mouth daily. 04/22/17  Yes [provider]  chlorthalidone (HYGROTON) 25 MG tablet Take 12.5 mg by mouth daily. 04/22/17  Yes [provider]  dutasteride (AVODART) 0.5 MG capsule TAKE ONE CAPSULE EVERY DAY Patient taking differently: Take 0.5 mg by mouth once a day 07/17/17  Yes Nafziger, Tommi Rumps, NP  Glucosamine 500 MG CAPS Take 500 mg by mouth 2 (two) times daily.    Yes [provider]  ibuprofen (ADVIL,MOTRIN) 200 MG tablet Take 200-600 mg by mouth every 6 (six) hours as needed (  for pain).   Yes [provider]  Melatonin 1 MG TABS Take 1-2 mg by mouth at bedtime as needed (for sleep).    Yes [provider]  Multiple Vitamins-Minerals (PRESERVISION AREDS 2) CAPS Take 1 capsule by mouth 2 (two) times daily.   Yes [provider]  triamcinolone cream (KENALOG) 0.1 % Apply 1 application topically See admin instructions. 1 application to affected area one to two times a day as needed for irritation 09/12/17  Yes [provider]    Family History Family History  Problem Relation Age of Onset  . Multiple  myeloma Brother   . Prostate cancer Brother   . Hypertension Father        ?  . Lung cancer Sister   . Colon cancer Neg Hx   . Stomach cancer Neg Hx     Social History Social History   Tobacco Use  . Smoking status: Former Smoker    Years: 35.00  . Smokeless tobacco: Never Used  . Tobacco comment: quit late 1980's  Substance Use Topics  . Alcohol use: No  . Drug use: No     Allergies   Patient has no known allergies.   Review of Systems Review of Systems  Cardiovascular: Positive for chest pain.  Musculoskeletal: Positive for myalgias.  All other systems reviewed and are negative.    Physical Exam Updated Vital Signs BP (!) 147/84 (BP Location: Right Arm)   Pulse 79   Temp 98.1 F (36.7 C) (Oral)   Resp 18   Ht 5\' 9"  (1.753 m)   Wt 83 kg (183 lb)   SpO2 96%   BMI 27.02 kg/m   Physical Exam  Constitutional: He is oriented to person, place, and time. He appears well-developed and well-nourished.  HENT:  Head: Normocephalic and atraumatic.  Eyes: EOM are normal. Pupils are equal, round, and reactive to light.  Neck: Normal range of motion.  Cardiovascular: Normal rate and regular rhythm.  Pulmonary/Chest: Effort normal and breath sounds normal.  Abdominal: Soft. Bowel sounds are normal. There is tenderness.    Musculoskeletal: Normal range of motion. He exhibits no edema or deformity.  Neurological: He is alert and oriented to person, place, and time.  Skin: Skin is warm and dry.  Psychiatric: He has a normal mood and affect.  Nursing note and vitals reviewed.    ED Treatments / Results  Labs (all labs ordered are listed, but only abnormal results are displayed) Labs Reviewed - No data to display  EKG  EKG Interpretation None       Radiology Dg Chest 2 View  Result Date: 10/07/2017 CLINICAL DATA:  Back pain and bilateral rib pain EXAM: CHEST  2 VIEW COMPARISON:  08/23/2009 FINDINGS: The heart size and mediastinal contours are within normal  limits. Aortic atherosclerosis. Both lungs are clear. The visualized skeletal structures are unremarkable. IMPRESSION: No active cardiopulmonary disease. Electronically Signed   By: Kerby Moors M.D.   On: 10/07/2017 19:25    Procedures Procedures (including critical care time)  Medications Ordered in ED Medications - No data to display   Initial Impression / Assessment and Plan / ED Course  I have reviewed the triage vital signs and the nursing notes.  Pertinent labs & imaging results that were available during my care of the patient were reviewed by me and considered in my medical decision making (see chart for details).   Patient discussed with and seen by Dr. Venora Maples.  Patient with abdominal  wall pain after a mechanical fall. He did not strike his abdomen. No bruising.  Patient discharged home with symptomatic treatment and given strict instructions for follow-up with their primary care physician.  I have also discussed reasons to return immediately to the ER.  Patient expresses understanding and agrees with plan.      Final Clinical Impressions(s) / ED Diagnoses   Final diagnoses:  Abdominal wall pain    ED Discharge Orders    None    ,   Etta Quill, NP 10/08/17 Ina Kick, MD 10/08/17 [phone]

## 2017-10-08 NOTE — Discharge Instructions (Signed)
Ibuprofen or tylenol for pain/discomfort.

## 2017-10-14 DIAGNOSIS — Z0101 Encounter for examination of eyes and vision with abnormal findings: Secondary | ICD-10-CM | POA: Diagnosis not present

## 2017-10-14 DIAGNOSIS — Z0100 Encounter for examination of eyes and vision without abnormal findings: Secondary | ICD-10-CM | POA: Diagnosis not present

## 2017-10-23 ENCOUNTER — Other Ambulatory Visit: Payer: Self-pay | Admitting: Family Medicine

## 2017-10-23 DIAGNOSIS — I10 Essential (primary) hypertension: Secondary | ICD-10-CM

## 2017-12-10 DIAGNOSIS — H35372 Puckering of macula, left eye: Secondary | ICD-10-CM | POA: Diagnosis not present

## 2017-12-10 DIAGNOSIS — H353212 Exudative age-related macular degeneration, right eye, with inactive choroidal neovascularization: Secondary | ICD-10-CM | POA: Diagnosis not present

## 2017-12-10 DIAGNOSIS — H353123 Nonexudative age-related macular degeneration, left eye, advanced atrophic without subfoveal involvement: Secondary | ICD-10-CM | POA: Diagnosis not present

## 2017-12-10 DIAGNOSIS — H353114 Nonexudative age-related macular degeneration, right eye, advanced atrophic with subfoveal involvement: Secondary | ICD-10-CM | POA: Diagnosis not present

## 2017-12-23 DIAGNOSIS — R69 Illness, unspecified: Secondary | ICD-10-CM | POA: Diagnosis not present

## 2018-01-19 ENCOUNTER — Other Ambulatory Visit: Payer: Self-pay | Admitting: Adult Health

## 2018-01-20 NOTE — Telephone Encounter (Signed)
Sent to the pharmacy by e-scribe. 

## 2018-04-16 ENCOUNTER — Other Ambulatory Visit: Payer: Self-pay | Admitting: Adult Health

## 2018-04-17 NOTE — Telephone Encounter (Signed)
Due for cpx on/after 05/03/18

## 2018-04-22 IMAGING — CT CT ABD-PELV W/O CM
2 of 4 series · 17 of 46 positions shown, 19 images · non-contrast
Comparison: None.

CLINICAL DATA: Chronic diarrhea.

EXAM:
CT ABDOMEN AND PELVIS WITHOUT CONTRAST
TECHNIQUE: Multidetector CT imaging of the abdomen and pelvis was performed
following the standard protocol without IV contrast.

[Series 2: abd/ pelvis 5.0 i40f 2 · axial · 0.84mm/px · z∈[+900,+1325]mm · 14 of 95 slices shown, 16 images]
[im 5/95  soft-tissue]
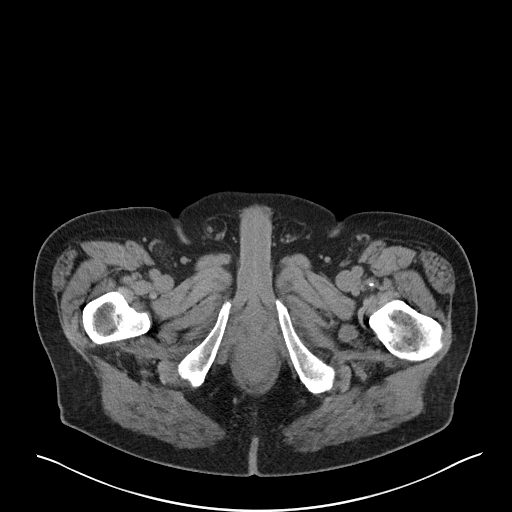
[im 5/95  bone]
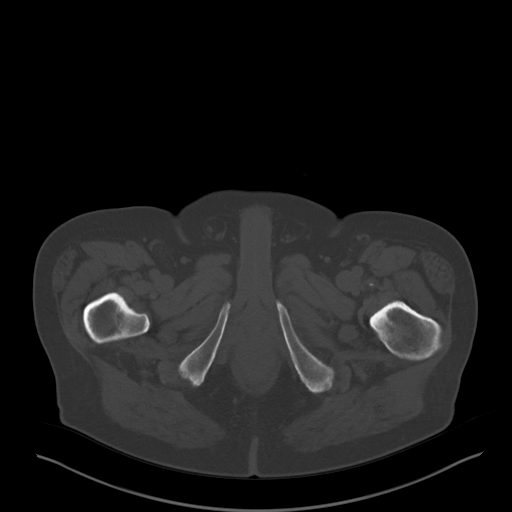
[im 14/95  soft-tissue]
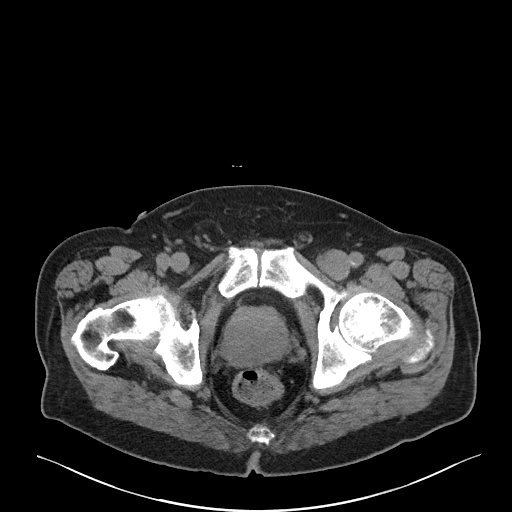
[im 18/95  soft-tissue]
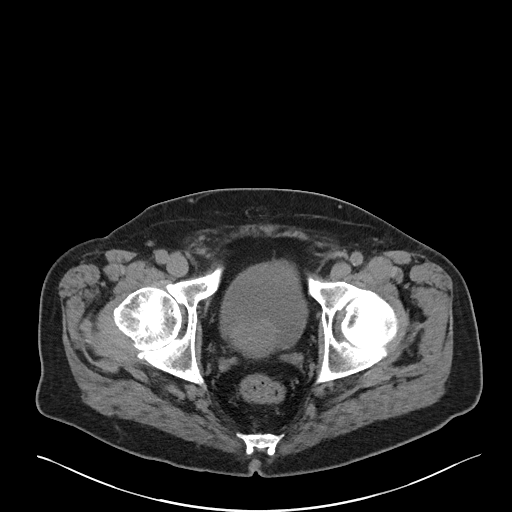
[im 27/95  soft-tissue]
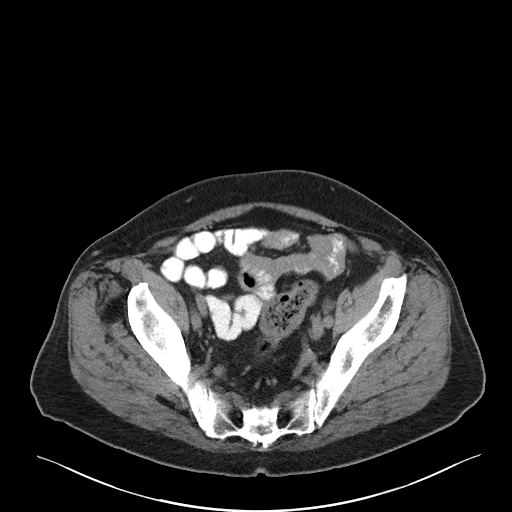
[im 32/95  soft-tissue]
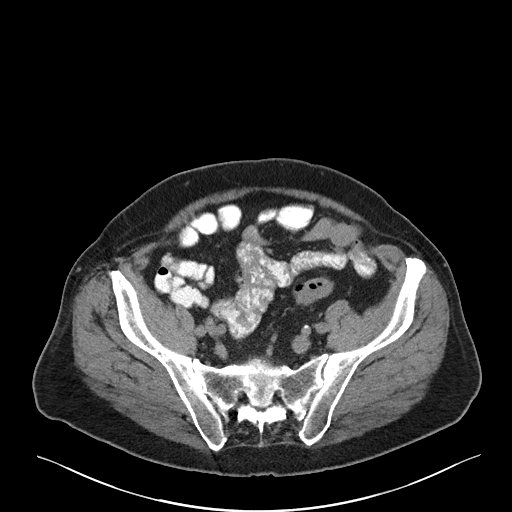
[im 36/95  soft-tissue]
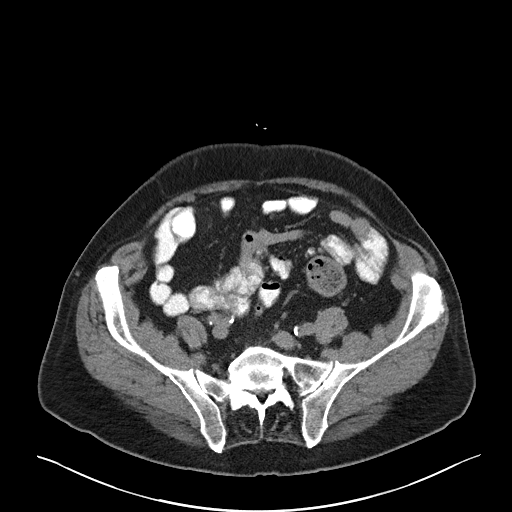
[im 45/95  soft-tissue]
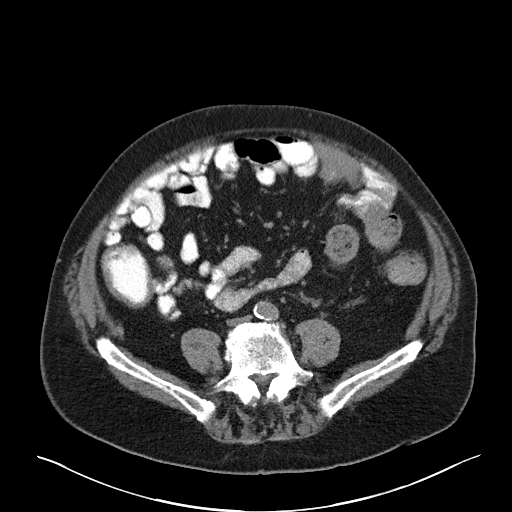
[im 50/95  soft-tissue]
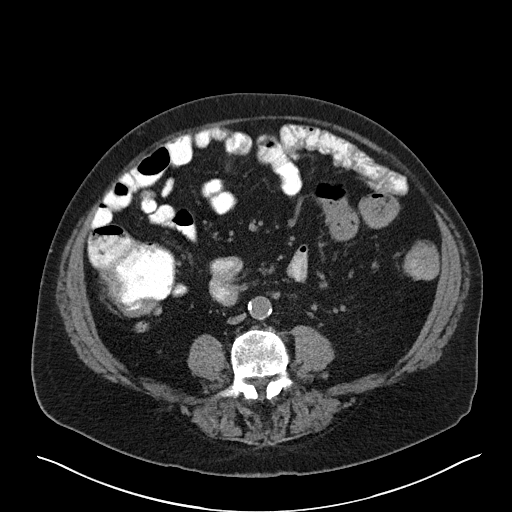
[im 59/95  soft-tissue]
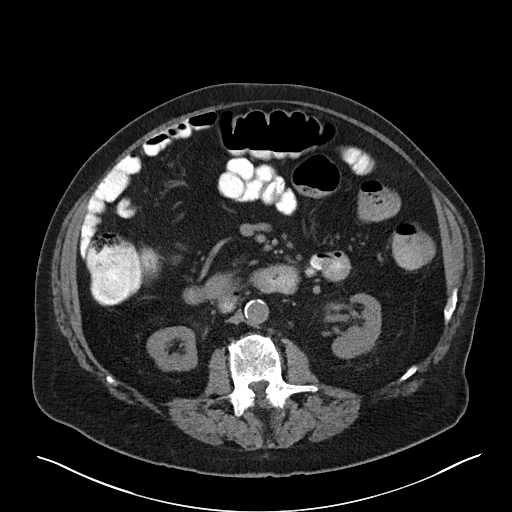
[im 59/95  bone]
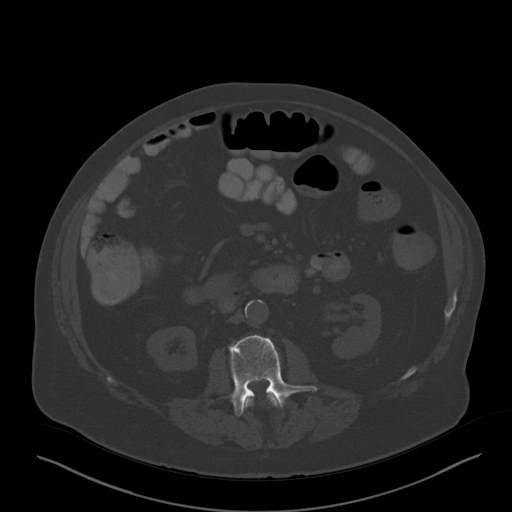
[im 63/95  soft-tissue]
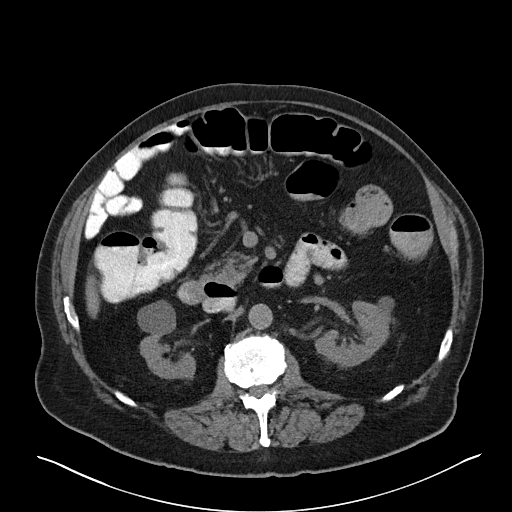
[im 72/95  soft-tissue]
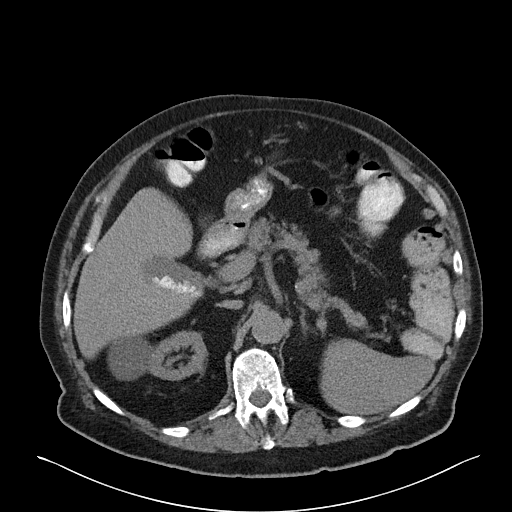
[im 77/95  soft-tissue]
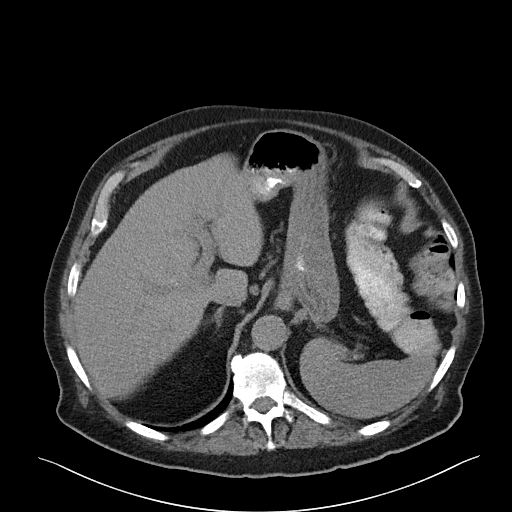
[im 81/95  soft-tissue]
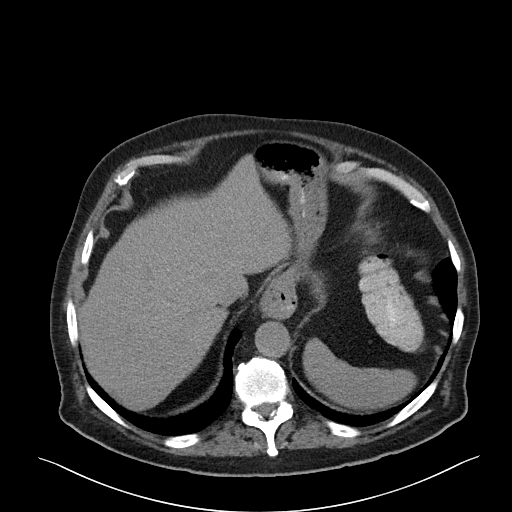
[im 90/95  soft-tissue]
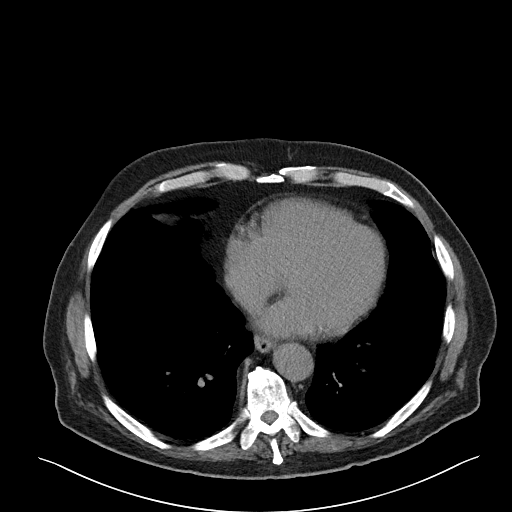

[Series 5: cor st · coronal · 0.92mm/px · 3 of 101 slices shown]
[im 34/101  soft-tissue]
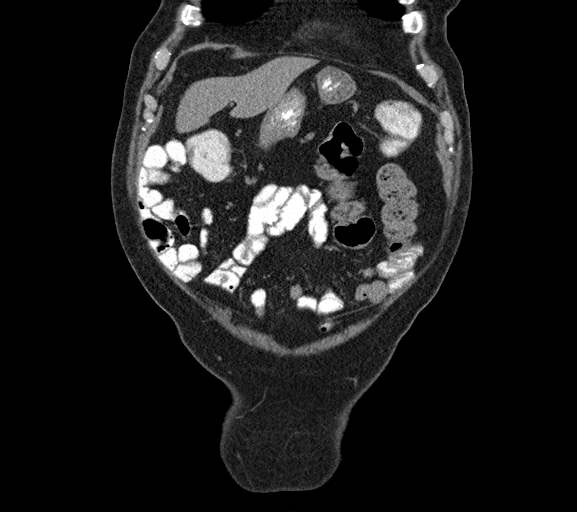
[im 45/101  soft-tissue]
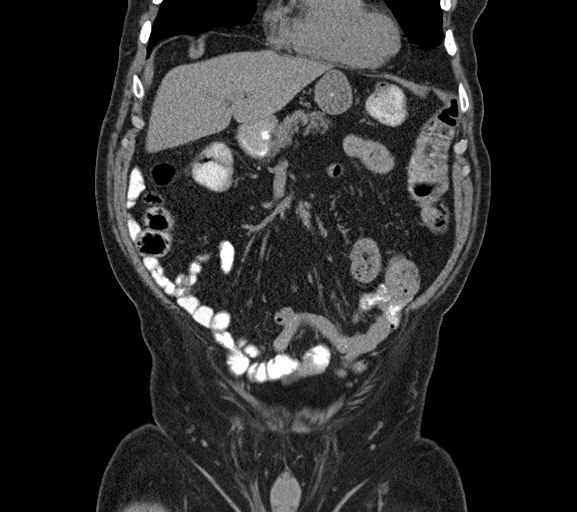
[im 56/101  soft-tissue]
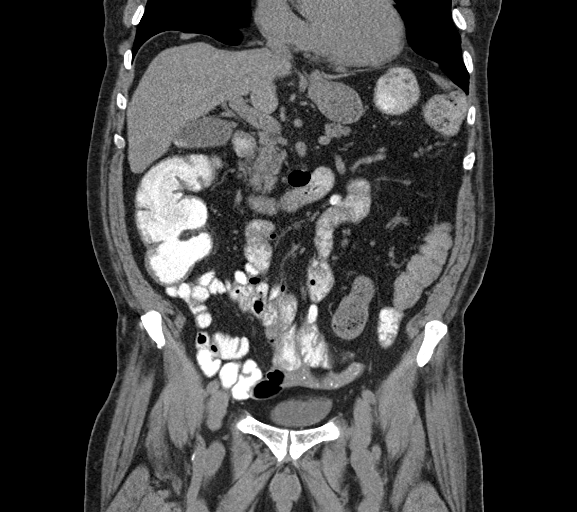

[17 of 46 positions shown; findings below may reference images not displayed]

FINDINGS: There is some dependent atelectasis in the lung bases. No pleural or
pericardial effusion.

Innumerable tiny stones are seen layering dependently within the
gallbladder. A single larger stone measures 1.4 cm. No
pericholecystic fluid or wall thickening. The liver, spleen, adrenal
glands and pancreas appear normal. Bilateral renal cysts are noted.
Aortoiliac atherosclerosis without aneurysm is seen.

The prostate gland is mildly enlarged. Small fat containing inguinal
hernias are identified, larger on the right. The stomach, small and
large bowel and appendix appear normal. No lymphadenopathy or fluid
is identified.

Lower lumbar degenerative disc disease is seen. There is also some
degenerative change about the hips. No lytic or sclerotic lesion is
identified.
IMPRESSION: No acute abnormality or finding to explain the patient's symptoms.

Gallstones without evidence of cholecystitis.

Aortoiliac atherosclerosis.

Prostatomegaly.

Small fat containing inguinal hernias, larger on the right.

## 2018-04-22 NOTE — Telephone Encounter (Signed)
Spoke to Shane [name].  Pt is out of the home for the next hour.  Will call back.

## 2018-04-23 NOTE — Telephone Encounter (Signed)
Pt scheduled for 06/09/18 @ 8 AM.  Medication sent in for 90 day supply.

## 2018-05-21 ENCOUNTER — Other Ambulatory Visit: Payer: Self-pay | Admitting: Adult Health

## 2018-06-09 ENCOUNTER — Encounter: Payer: Self-pay | Admitting: Adult Health

## 2018-06-09 ENCOUNTER — Ambulatory Visit (INDEPENDENT_AMBULATORY_CARE_PROVIDER_SITE_OTHER): Payer: Medicare HMO | Admitting: Adult Health

## 2018-06-09 VITALS — BP 138/70 | Temp 97.8°F | Ht 69.5 in | Wt 194.0 lb

## 2018-06-09 DIAGNOSIS — E781 Pure hyperglyceridemia: Secondary | ICD-10-CM

## 2018-06-09 DIAGNOSIS — R1310 Dysphagia, unspecified: Secondary | ICD-10-CM | POA: Diagnosis not present

## 2018-06-09 DIAGNOSIS — I10 Essential (primary) hypertension: Secondary | ICD-10-CM | POA: Diagnosis not present

## 2018-06-09 DIAGNOSIS — Z23 Encounter for immunization: Secondary | ICD-10-CM | POA: Diagnosis not present

## 2018-06-09 DIAGNOSIS — N400 Enlarged prostate without lower urinary tract symptoms: Secondary | ICD-10-CM

## 2018-06-09 DIAGNOSIS — M109 Gout, unspecified: Secondary | ICD-10-CM

## 2018-06-09 DIAGNOSIS — Z0000 Encounter for general adult medical examination without abnormal findings: Secondary | ICD-10-CM

## 2018-06-09 LAB — CBC WITH DIFFERENTIAL/PLATELET
BASOS PCT: 0.8 % (ref 0.0–3.0)
Basophils Absolute: 0.1 10*3/uL (ref 0.0–0.1)
EOS ABS: 0.5 10*3/uL (ref 0.0–0.7)
Eosinophils Relative: 8.8 % — ABNORMAL HIGH (ref 0.0–5.0)
HCT: 39 % (ref 39.0–52.0)
Hemoglobin: 13 g/dL (ref 13.0–17.0)
LYMPHS PCT: 23 % (ref 12.0–46.0)
Lymphs Abs: 1.4 10*3/uL (ref 0.7–4.0)
MCHC: 33.4 g/dL (ref 30.0–36.0)
MCV: 92 fl (ref 78.0–100.0)
Monocytes Absolute: 0.6 10*3/uL (ref 0.1–1.0)
Monocytes Relative: 9.2 % (ref 3.0–12.0)
NEUTROS ABS: 3.6 10*3/uL (ref 1.4–7.7)
Neutrophils Relative %: 58.2 % (ref 43.0–77.0)
PLATELETS: 162 10*3/uL (ref 150.0–400.0)
RBC: 4.24 Mil/uL (ref 4.22–5.81)
RDW: 14.7 % (ref 11.5–15.5)
WBC: 6.3 10*3/uL (ref 4.0–10.5)

## 2018-06-09 LAB — TSH: TSH: 3.11 u[IU]/mL (ref 0.35–4.50)

## 2018-06-09 LAB — HEPATIC FUNCTION PANEL
ALBUMIN: 3.8 g/dL (ref 3.5–5.2)
ALT: 18 U/L (ref 0–53)
AST: 25 U/L (ref 0–37)
Alkaline Phosphatase: 29 U/L — ABNORMAL LOW (ref 39–117)
Bilirubin, Direct: 0.1 mg/dL (ref 0.0–0.3)
TOTAL PROTEIN: 6.5 g/dL (ref 6.0–8.3)
Total Bilirubin: 0.8 mg/dL (ref 0.2–1.2)

## 2018-06-09 LAB — BASIC METABOLIC PANEL
BUN: 37 mg/dL — AB (ref 6–23)
CHLORIDE: 109 meq/L (ref 96–112)
CO2: 25 meq/L (ref 19–32)
CREATININE: 1.77 mg/dL — AB (ref 0.40–1.50)
Calcium: 9 mg/dL (ref 8.4–10.5)
GFR: 39.15 mL/min — ABNORMAL LOW (ref >60.00)
GLUCOSE: 100 mg/dL — AB (ref 70–99)
Potassium: 4.7 mEq/L (ref 3.5–5.1)
Sodium: 142 mEq/L (ref 135–145)

## 2018-06-09 LAB — LIPID PANEL
Cholesterol: 158 mg/dL (ref 0–200)
HDL: 41 mg/dL (ref >39.00)
LDL Cholesterol: 98 mg/dL (ref 0–99)
NonHDL: 116.72
TRIGLYCERIDES: 96 mg/dL (ref 0.0–149.0)
Total CHOL/HDL Ratio: 4
VLDL: 19.2 mg/dL (ref 0.0–40.0)

## 2018-06-09 LAB — PSA: PSA: 2.75 ng/mL (ref 0.10–4.00)

## 2018-06-09 MED ORDER — DUTASTERIDE 0.5 MG PO CAPS: 0.5000 mg | ORAL_CAPSULE | Freq: Every day | ORAL | 3 refills | Status: DC

## 2018-06-09 MED ORDER — ALLOPURINOL 300 MG PO TABS: 300.0000 mg | ORAL_TABLET | Freq: Every day | ORAL | 0 refills | Status: DC

## 2018-06-09 MED ORDER — CHLORTHALIDONE 25 MG PO TABS: 12.5000 mg | ORAL_TABLET | Freq: Every day | ORAL | 3 refills | Status: DC

## 2018-06-09 MED ORDER — ATENOLOL 25 MG PO TABS: 25.0000 mg | ORAL_TABLET | Freq: Every day | ORAL | 3 refills | Status: DC

## 2018-06-09 NOTE — Patient Instructions (Signed)
It was great seeing you today.   I will follow up with you regarding your blood work   Someone will call you to schedule your appointment with GI for the choking sensation you have been experiencing.   Please let me know if you need anything

## 2018-06-09 NOTE — Progress Notes (Signed)
Subjective:    Patient ID: Shane [name], male    DOB: 05/25/1932, 84 y.o.   MRN: [id number]  HPI Patient presents for yearly preventative medicine examination. She is a pleasant 84 year old male who  has a past medical history of Allergy, Anemia, iron deficiency, Cancer ([facility] [place]), DDD (degenerative disc disease), cervical, Dysplastic nevus of left upper extremity, Elevated PSA, Essential hypertension, Extrinsic asthma, GERD (gastroesophageal reflux disease), Gout, Hiatal hernia, Macular degeneration, Pneumonia, and Spinal stenosis.  Essential Hypertension -He takes atenolol 50 mg and chlorthalidone 25 mg BP Readings from Last 3 Encounters:  06/09/18 138/70  10/08/17 (!) 136/91  05/02/17 126/72   H/O Gout -takes allopurinol 300 mg daily.  No recent gout flares  BPH - Controlled with Avodart   Hyperlipidemia - Diet controlled in the past.  Lab Results  Component Value Date   CHOL 176 05/02/2017   HDL 37.80 (L) 05/02/2017   LDLCALC 119 (H) 05/02/2017   LDLDIRECT 102.7 05/16/2008   TRIG 100.0 05/02/2017   CHOLHDL 5 05/02/2017    All immunizations and health maintenance protocols were reviewed with the patient and needed orders were placed. He is due for seasonal flu   Appropriate screening laboratory values were ordered for the patient including screening of hyperlipidemia, renal function and hepatic function.  Medication reconciliation,  past medical history, social history, problem list and allergies were reviewed in detail with the patient  Goals were established with regard to weight loss, exercise, and  diet in compliance with medications. He continues to play golf.  Wt Readings from Last 3 Encounters:  06/09/18 194 lb (88 kg)  10/07/17 183 lb (83 kg)  05/02/17 185 lb (83.9 kg)   End of life planning was discussed.  Refuses advanced directive or living well  He has been seen routinely by his eye doctor and dermatologist within the last year  His only complaint today is  that of " choking" Reports that over the last 2-3 months he has been experiencing a choking sensation when he eats or takes medications. This does not happen every day but happens more times than not. Denies any vomiting during these episodes but does have to often " cough it back up."   Review of Systems  Constitutional: Negative.   HENT: Positive for hearing loss.   Eyes: Negative.   Respiratory: Negative.   Cardiovascular: Negative.   Gastrointestinal: Negative.   Endocrine: Negative.   Genitourinary: Positive for frequency (nocturia ).  Musculoskeletal: Positive for arthralgias.  Skin: Negative.   Allergic/Immunologic: Negative.   Neurological: Negative.   Hematological: Negative.   Psychiatric/Behavioral: Negative.   All other systems reviewed and are negative.  Past Medical History:  Diagnosis Date  . Allergy   . Anemia, iron deficiency   . 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 DDD (degenerative disc disease), cervical   . Dysplastic nevus of left upper extremity   . Elevated PSA   . Essential hypertension   . Extrinsic asthma   . GERD (gastroesophageal reflux disease)    no recent  . Gout   . Hiatal hernia   . Macular degeneration   . Pneumonia    2014ish  . Spinal stenosis     Social History   Socioeconomic History  . Marital status: Married    Spouse name: Not on file  . Number of children: 3  . Years of education: Not on file  . Highest education level: Not on file  Occupational History  .  Occupation: retired  Scientific laboratory technician  . Financial resource strain: Not on file  . Food insecurity:    Worry: Not on file    Inability: Not on file  . Transportation needs:    Medical: Not on file    Non-medical: Not on file  Tobacco Use  . Smoking status: Former Smoker    Years: 35.00  . Smokeless tobacco: Never Used  . Tobacco comment: quit late 1980's  Substance and Sexual Activity  . Alcohol use: No  . Drug use: No  . Sexual activity: Not on file    Lifestyle  . Physical activity:    Days per week: Not on file    Minutes per session: Not on file  . Stress: Not on file  Relationships  . Social connections:    Talks on phone: Not on file    Gets together: Not on file    Attends religious service: Not on file    Active member of club or organization: Not on file    Attends meetings of clubs or organizations: Not on file    Relationship status: Not on file  . Intimate partner violence:    Fear of current or ex partner: Not on file    Emotionally abused: Not on file    Physically abused: Not on file    Forced sexual activity: Not on file  Other Topics Concern  . Not on file  Social History Narrative   Retired from Psychologist, educational    Married for 55 years    Three children all live locally    Sharon to play golf and go to [employer].     Past Surgical History:  Procedure Laterality Date  . BACK SURGERY    . Bone Spur Right    Shoulder  . COLONOSCOPY W/ POLYPECTOMY    . COLONOSCOPY WITH PROPOFOL N/A 11/19/2016   Procedure: COLONOSCOPY WITH PROPOFOL;  Surgeon: Doran Stabler, MD;  Location: [facility];  Service: Gastroenterology;  Laterality: N/A;  . FECAL TRANSPLANT N/A 11/19/2016   Procedure: FECAL TRANSPLANT;  Surgeon: Doran Stabler, MD;  Location: [facility];  Service: Gastroenterology;  Laterality: N/A;  . SKIN CANCER EXCISION      Family History  Problem Relation Age of Onset  . Multiple myeloma Brother   . Prostate cancer Brother   . Hypertension Father        ?  . Lung cancer Sister   . Colon cancer Neg Hx   . Stomach cancer Neg Hx     No Known Allergies  Current Outpatient Medications on File Prior to Visit  Medication Sig Dispense Refill  . acetaminophen (TYLENOL) 500 MG tablet Take 500-1,000 mg by mouth every 6 (six) hours as needed (for pain).    Marland Kitchen allopurinol (ZYLOPRIM) 300 MG tablet TAKE 1 TABLET (300 MG TOTAL) BY MOUTH DAILY. 90 tablet 0  . atenolol (TENORMIN) 25 MG tablet TAKE 1 TABLET BY  MOUTH EVERY DAY 90 tablet 1  . chlorthalidone (HYGROTON) 25 MG tablet TAKE 1/2 TABLET BY MOUTH EVERY DAY 45 tablet 1  . dutasteride (AVODART) 0.5 MG capsule TAKE ONE CAPSULE EVERY DAY 90 capsule 0  . Glucosamine 500 MG CAPS Take 500 mg by mouth 2 (two) times daily.     Marland Kitchen ibuprofen (ADVIL,MOTRIN) 200 MG tablet Take 200-600 mg by mouth every 6 (six) hours as needed (for pain).    . Multiple Vitamins-Minerals (PRESERVISION AREDS 2) CAPS Take 1 capsule by mouth 2 (two)  times daily.     No current facility-administered medications on file prior to visit.     BP 138/70   Temp 97.8 F (36.6 C) (Oral)   Ht 5' 9.5" (1.765 m)   Wt 194 lb (88 kg)   BMI 28.24 kg/m       Objective:   Physical Exam  Constitutional: He is oriented to person, place, and time. He appears well-developed and well-nourished. No distress.  HENT:  Head: Normocephalic and atraumatic.  Right Ear: External ear normal.  Left Ear: External ear normal.  Nose: Nose normal.  Mouth/Throat: Oropharynx is clear and moist. Abnormal dentition. No oropharyngeal exudate.  Eyes: Pupils are equal, round, and reactive to light. Conjunctivae and EOM are normal. Right eye exhibits no discharge. Left eye exhibits no discharge. No scleral icterus.  Neck: Normal range of motion. Neck supple. No JVD present. No tracheal deviation present. No thyromegaly present.  Cardiovascular: Normal rate, regular rhythm, normal heart sounds and intact distal pulses. Exam reveals no gallop and no friction rub.  No murmur heard. Pulmonary/Chest: Effort normal and breath sounds normal. No stridor. No respiratory distress. He has no wheezes. He has no rales. He exhibits no tenderness.  Abdominal: Soft. Bowel sounds are normal. He exhibits no distension and no mass. There is no tenderness. There is no rebound and no guarding. No hernia.  Musculoskeletal: Normal range of motion. He exhibits no edema, tenderness or deformity.  Lymphadenopathy:    He has no  cervical adenopathy.  Neurological: He is alert and oriented to person, place, and time. He displays normal reflexes. No cranial nerve deficit or sensory deficit. He exhibits normal muscle tone. Coordination normal.  Skin: Skin is warm and dry. Capillary refill takes less than 2 seconds. No rash noted. He is not diaphoretic. No erythema. No pallor.  Psychiatric: He has a normal mood and affect. His behavior is normal. Judgment and thought content normal.  Nursing note and vitals reviewed.     Assessment & Plan:  1. Routine general medical examination at a health care facility -  - Basic metabolic panel - CBC with Differential/Platelet - Hepatic function panel - Lipid panel - TSH  2. Hypertriglyceridemia - Consider statin  - Basic metabolic panel - CBC with Differential/Platelet - Hepatic function panel - Lipid panel - TSH  3. Essential hypertension - At goal for patient. No change in medication  - Basic metabolic panel - CBC with Differential/Platelet - Hepatic function panel - Lipid panel - TSH - atenolol (TENORMIN) 25 MG tablet; Take 1 tablet (25 mg total) by mouth daily.  Dispense: 90 tablet; Refill: 3 - chlorthalidone (HYGROTON) 25 MG tablet; Take 0.5 tablets (12.5 mg total) by mouth daily.  Dispense: 45 tablet; Refill: 3  4. Gout, unspecified cause, unspecified chronicity, unspecified site - Well controlled. No change in medications  - allopurinol (ZYLOPRIM) 300 MG tablet; Take 1 tablet (300 mg total) by mouth daily.  Dispense: 90 tablet; Refill: 0  5. Benign prostatic hyperplasia without lower urinary tract symptoms  - PSA  6. Dysphagia, unspecified type  - Ambulatory referral to Gastroenterology  7. Need for influenza vaccination  - Flu vaccine HIGH DOSE PF (Fluzone High dose)  Dorothyann Peng, NP

## 2018-06-10 ENCOUNTER — Other Ambulatory Visit: Payer: Self-pay | Admitting: Family Medicine

## 2018-06-10 DIAGNOSIS — H35372 Puckering of macula, left eye: Secondary | ICD-10-CM | POA: Diagnosis not present

## 2018-06-10 DIAGNOSIS — N289 Disorder of kidney and ureter, unspecified: Secondary | ICD-10-CM

## 2018-06-10 DIAGNOSIS — H353114 Nonexudative age-related macular degeneration, right eye, advanced atrophic with subfoveal involvement: Secondary | ICD-10-CM | POA: Diagnosis not present

## 2018-06-10 DIAGNOSIS — H353212 Exudative age-related macular degeneration, right eye, with inactive choroidal neovascularization: Secondary | ICD-10-CM | POA: Diagnosis not present

## 2018-06-10 DIAGNOSIS — H353123 Nonexudative age-related macular degeneration, left eye, advanced atrophic without subfoveal involvement: Secondary | ICD-10-CM | POA: Diagnosis not present

## 2018-06-10 NOTE — Progress Notes (Signed)
Labs ordered.

## 2018-07-08 ENCOUNTER — Other Ambulatory Visit (INDEPENDENT_AMBULATORY_CARE_PROVIDER_SITE_OTHER): Payer: Medicare HMO

## 2018-07-08 DIAGNOSIS — N289 Disorder of kidney and ureter, unspecified: Secondary | ICD-10-CM

## 2018-07-08 LAB — BASIC METABOLIC PANEL
BUN: 51 mg/dL — ABNORMAL HIGH (ref 6–23)
CHLORIDE: 106 meq/L (ref 96–112)
CO2: 25 meq/L (ref 19–32)
Calcium: 9 mg/dL (ref 8.4–10.5)
Creatinine, Ser: 1.73 mg/dL — ABNORMAL HIGH (ref 0.40–1.50)
GFR: 40.19 mL/min — ABNORMAL LOW (ref >60.00)
Glucose, Bld: 93 mg/dL (ref 70–99)
Potassium: 4.9 mEq/L (ref 3.5–5.1)
SODIUM: 139 meq/L (ref 135–145)

## 2018-07-15 ENCOUNTER — Encounter: Payer: Self-pay | Admitting: Gastroenterology

## 2018-07-15 ENCOUNTER — Ambulatory Visit: Payer: Medicare HMO | Admitting: Gastroenterology

## 2018-07-15 VITALS — BP 136/78 | HR 56 | Ht 70.0 in | Wt 195.1 lb

## 2018-07-15 DIAGNOSIS — R1314 Dysphagia, pharyngoesophageal phase: Secondary | ICD-10-CM | POA: Diagnosis not present

## 2018-07-15 NOTE — Patient Instructions (Signed)
If you are age 82 or older, your body mass index should be between 23-30. Your Body mass index is 28 kg/m. If this is out of the aforementioned range listed, please consider follow up with your Primary Care Provider.  If you are age 28 or younger, your body mass index should be between 19-25. Your Body mass index is 28 kg/m. If this is out of the aformentioned range listed, please consider follow up with your Primary Care Provider.   You have been scheduled for an endoscopy. Please follow written instructions given to you at your visit today. If you use inhalers (even only as needed), please bring them with you on the day of your procedure. Your physician has requested that you go to www.startemmi.com and enter the access code given to you at your visit today. This web site gives a general overview about your procedure. However, you should still follow specific instructions given to you by our office regarding your preparation for the procedure.  It was a pleasure to see you today!  Dr. Loletha Carrow

## 2018-07-15 NOTE — Progress Notes (Signed)
      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  He has done well since then without recurrent diarrhea.  For about the last 4 to 6 months he has had intermittent dysphagia to both solids and quids, but also sometimes pills and capsules.  This typically feels like it gets hung up in the neck or upper chest, and he may have to bring it back up.  He has not noticed a change in vocal quality, and does not get much heartburn.  He denies upper abdominal pain, early satiety, vomiting or weight loss.  ROS: Cardiovascular:  no chest pain Respiratory: no dyspnea Remainder of systems negative except as above The patient's Past Medical, Family and Social History were reviewed and are on file in the EMR.  Objective:  Med list reviewed  Current Outpatient Medications:  .  acetaminophen (TYLENOL) 500 MG tablet, Take 500-1,000 mg by mouth every 6 (six) hours as needed (for pain)., Disp: , Rfl:  .  allopurinol (ZYLOPRIM) 300 MG tablet, Take 1 tablet (300 mg total) by mouth daily., Disp: 90 tablet, Rfl: 0 .  atenolol (TENORMIN) 25 MG tablet, Take 1 tablet (25 mg total) by mouth daily., Disp: 90 tablet, Rfl: 3 .  chlorthalidone (HYGROTON) 25 MG tablet, Take 0.5 tablets (12.5 mg total) by mouth daily., Disp: 45 tablet, Rfl: 3 .  dutasteride (AVODART) 0.5 MG capsule, Take 1 capsule (0.5 mg total) by mouth daily., Disp: 90 capsule, Rfl: 3 .  Glucosamine 500 MG CAPS, Take 500 mg by mouth 2 (two) times daily. , Disp: , Rfl:  .  ibuprofen (ADVIL,MOTRIN) 200 MG tablet, Take 200-600 mg by mouth every 6 (six) hours as needed (for pain)., Disp: , Rfl:  .  Multiple Vitamins-Minerals (PRESERVISION AREDS 2) CAPS, Take 1 capsule by mouth 2 (two) times daily., Disp: , Rfl:    Vital signs in last 24 hrs: Vitals:   07/15/18 1352  BP:  136/78  Pulse: (!) 56    Physical Exam  He is well-appearing, normal vocal quality.  HEENT: sclera anicteric, oral mucosa moist without lesions  Neck: supple, no thyromegaly, JVD or lymphadenopathy  Cardiac: RRR without murmurs, S1S2 heard, no peripheral edema  Pulm: clear to auscultation bilaterally, normal RR and effort noted  Abdomen: soft, no tenderness, with active bowel sounds. No guarding or palpable hepatosplenomegaly.  Skin; warm and dry, no jaundice or rash   @ASSESSMENTPLANBEGIN @ Assessment: Encounter Diagnosis  Name Primary?  . Pharyngoesophageal dysphagia Yes   Difficult to tell if it is cricopharyngeal dysfunction or ring/stricture.  Less likely malignancy without red flag symptoms.   Plan: EGD with possible dilation.  He is agreeable after thorough discussion of procedure and risks.  The benefits and risks of the planned procedure were described in detail with the patient or (when appropriate) their health care proxy.  Risks were outlined as including, but not limited to, bleeding, infection, perforation, adverse medication reaction leading to cardiac or pulmonary decompensation, or pancreatitis (if ERCP).  The limitation of incomplete mucosal visualization was also discussed.  No guarantees or warranties were given.   Shane [name]

## 2018-07-28 ENCOUNTER — Encounter: Payer: Self-pay | Admitting: Gastroenterology

## 2018-08-11 ENCOUNTER — Encounter: Payer: Self-pay | Admitting: Gastroenterology

## 2018-08-11 ENCOUNTER — Ambulatory Visit (AMBULATORY_SURGERY_CENTER): Payer: Medicare HMO | Admitting: Gastroenterology

## 2018-08-11 VITALS — BP 119/62 | HR 56 | Temp 98.2°F | Resp 21 | Ht 70.0 in | Wt 195.0 lb

## 2018-08-11 DIAGNOSIS — R1310 Dysphagia, unspecified: Secondary | ICD-10-CM | POA: Diagnosis not present

## 2018-08-11 DIAGNOSIS — I10 Essential (primary) hypertension: Secondary | ICD-10-CM | POA: Diagnosis not present

## 2018-08-11 DIAGNOSIS — R1314 Dysphagia, pharyngoesophageal phase: Secondary | ICD-10-CM

## 2018-08-11 DIAGNOSIS — K210 Gastro-esophageal reflux disease with esophagitis: Secondary | ICD-10-CM

## 2018-08-11 DIAGNOSIS — K449 Diaphragmatic hernia without obstruction or gangrene: Secondary | ICD-10-CM | POA: Diagnosis not present

## 2018-08-11 DIAGNOSIS — K219 Gastro-esophageal reflux disease without esophagitis: Secondary | ICD-10-CM | POA: Diagnosis not present

## 2018-08-11 DIAGNOSIS — K222 Esophageal obstruction: Secondary | ICD-10-CM

## 2018-08-11 MED ORDER — SODIUM CHLORIDE 0.9 % IV SOLN: 500.0000 mL | Freq: Once | INTRAVENOUS | Status: DC

## 2018-08-11 MED ORDER — OMEPRAZOLE 40 MG PO CPDR: 40.0000 mg | DELAYED_RELEASE_CAPSULE | Freq: Every day | ORAL | 3 refills | Status: DC

## 2018-08-11 NOTE — Progress Notes (Signed)
Called to room to assist during endoscopic procedure.  Patient ID and intended procedure confirmed with present staff. Received instructions for my participation in the procedure from the performing physician.  

## 2018-08-11 NOTE — Op Note (Signed)
[facility] Patient Name: Shane [name] Procedure Date: 08/11/2018 11:28 AM MRN: [id number] Endoscopist: [place]. Loletha Carrow , MD Age: 84 Referring MD:  Date of Birth: [birth year] Gender: Male Account #: [id number] Procedure:                Upper GI endoscopy Indications:              Dysphagia Medicines:                Monitored Anesthesia Care Procedure:                Pre-Anesthesia Assessment:                           - Prior to the procedure, a History and Physical                            was performed, and patient medications and                            allergies were reviewed. The patient's tolerance of                            previous anesthesia was also reviewed. The risks                            and benefits of the procedure and the sedation                            options and risks were discussed with the patient.                            All questions were answered, and informed consent                            was obtained. Prior Anticoagulants: The patient has                            taken no previous anticoagulant or antiplatelet                            agents. ASA Grade Assessment: II - A patient with                            mild systemic disease. After reviewing the risks                            and benefits, the patient was deemed in                            satisfactory condition to undergo the procedure.                           After obtaining informed consent, the endoscope was  passed under direct vision. Throughout the                            procedure, the patient's blood pressure, pulse, and                            oxygen saturations were monitored continuously. The                            Endoscope was introduced through the mouth, and                            advanced to the second part of duodenum. The upper                            GI endoscopy was accomplished without  difficulty.                            The patient tolerated the procedure well. Scope In: Scope Out: Findings:                 The larynx was normal.                           LA Grade C (one or more mucosal breaks continuous                            between tops of 2 or more mucosal folds, less than                            75% circumference) esophagitis with no bleeding was                            found in the distal esophagus.                           One benign-appearing, intrinsic moderate stenosis                            was found at the gastroesophageal junction. This                            stenosis measured 1.2 cm (inner diameter) x less                            than one cm (in length). The stenosis was                            traversed. A TTS dilator was passed through the                            scope. Dilation with a 16-17-18 mm balloon dilator  was performed to 18 mm. The dilation site was                            examined and showed moderate mucosal disruption and                            moderate improvement in luminal narrowing.                           A 6 cm hiatal hernia was present (EGJ at 37 cm from                            incisors).                           The stomach was normal.                           The cardia and gastric fundus were normal on                            retroflexion.                           The examined duodenum was normal. Complications:            No immediate complications. Estimated Blood Loss:     Estimated blood loss was minimal. Impression:               - Normal larynx.                           - LA Grade C reflux esophagitis.                           - Benign-appearing esophageal stenosis.                           - 6 cm hiatal hernia.                           - Normal stomach.                           - Normal examined duodenum.                           - No  specimens collected. Recommendation:           - Patient has a contact number available for                            emergencies. The signs and symptoms of potential                            delayed complications were discussed with the  patient. Return to normal activities tomorrow.                            Written discharge instructions were provided to the                            patient.                           - Clear liquid diet for 2 hours. Then soft diet                            until tomorrow, then resume regular diet.                           - Continue present medications.                           - Use Prilosec (omeprazole) 40 mg PO daily                            indefinitely. Disp #90, RF 3                           - Return to my office in 2 months. Tanajah Boulter L. Loletha Carrow, MD 08/11/2018 11:48:28 AM This report has been signed electronically.

## 2018-08-11 NOTE — Progress Notes (Signed)
Report given to PACU, vss 

## 2018-08-11 NOTE — Patient Instructions (Addendum)
Handouts provided to patient:  Esophagitis and Stricture and Post Esophageal Dilation Diet  New medication has been ordered for you, please pick up at pharmacy today.   Clear liquid diet for 2 hours then soft food until tomorrow.   YOU HAD AN ENDOSCOPIC PROCEDURE TODAY AT [facility]:   Refer to the procedure report that was given to you for any specific questions about what was found during the examination.  If the procedure report does not answer your questions, please call your gastroenterologist to clarify.  If you requested that your care partner not be given the details of your procedure findings, then the procedure report has been included in a sealed envelope for you to review at your convenience later.  YOU SHOULD EXPECT: Some feelings of bloating in the abdomen. Passage of more gas than usual.  Walking can help get rid of the air that was put into your GI tract during the procedure and reduce the bloating. If you had a lower endoscopy (such as a colonoscopy or flexible sigmoidoscopy) you may notice spotting of blood in your stool or on the toilet paper. If you underwent a bowel prep for your procedure, you may not have a normal bowel movement for a few days.  Please Note:  You might notice some irritation and congestion in your nose or some drainage.  This is from the oxygen used during your procedure.  There is no need for concern and it should clear up in a day or so.  SYMPTOMS TO REPORT IMMEDIATELY:   Following upper endoscopy (EGD)  Vomiting of blood or coffee ground material  New chest pain or pain under the shoulder blades  Painful or persistently difficult swallowing  New shortness of breath  Fever of 100F or higher  Black, tarry-looking stools  For urgent or emergent issues, a gastroenterologist can be reached at any hour by calling [phone].   DIET:  We do recommend a small meal at first, but then you may proceed to your regular diet.  Drink plenty  of fluids but you should avoid alcoholic beverages for 24 hours.  ACTIVITY:  You should plan to take it easy for the rest of today and you should NOT DRIVE or use heavy machinery until tomorrow (because of the sedation medicines used during the test).    FOLLOW UP: Our staff will call the number listed on your records the next business day following your procedure to check on you and address any questions or concerns that you may have regarding the information given to you following your procedure. If we do not reach you, we will leave a message.  However, if you are feeling well and you are not experiencing any problems, there is no need to return our call.  We will assume that you have returned to your regular daily activities without incident.  If any biopsies were taken you will be contacted by phone or by letter within the next 1-3 weeks.  Please call us at [phone] if you have not heard about the biopsies in 3 weeks.    SIGNATURES/CONFIDENTIALITY: You and/or your care partner have signed paperwork which will be entered into your electronic medical record.  These signatures attest to the fact that that the information above on your After Visit Summary has been reviewed and is understood.  Full responsibility of the confidentiality of this discharge information lies with you and/or your care-partner.

## 2018-08-12 ENCOUNTER — Telehealth: Payer: Self-pay | Admitting: *Deleted

## 2018-08-12 NOTE — Telephone Encounter (Signed)
  Follow up Call-  Call back number 08/11/2018 09/02/2016  Post procedure Call Back phone  # [phone] hm [phone]  Permission to leave phone message Yes Yes  Some recent data might be hidden     Patient questions:  Do you have a fever, pain , or abdominal swelling? No. Pain Score  2 (pt stated throat is little sore)  Have you tolerated food without any problems? Yes.    Have you been able to return to your normal activities? Yes.    Do you have any questions about your discharge instructions: Diet   No. Medications  No. Follow up visit  No.  Do you have questions or concerns about your Care? No.  Actions: * If pain score is 4 or above: No action needed, pain <4.

## 2018-08-28 DIAGNOSIS — I10 Essential (primary) hypertension: Secondary | ICD-10-CM | POA: Diagnosis not present

## 2018-08-28 DIAGNOSIS — N400 Enlarged prostate without lower urinary tract symptoms: Secondary | ICD-10-CM | POA: Diagnosis not present

## 2018-08-28 DIAGNOSIS — G8929 Other chronic pain: Secondary | ICD-10-CM | POA: Diagnosis not present

## 2018-08-28 DIAGNOSIS — K219 Gastro-esophageal reflux disease without esophagitis: Secondary | ICD-10-CM | POA: Diagnosis not present

## 2018-08-28 DIAGNOSIS — Z809 Family history of malignant neoplasm, unspecified: Secondary | ICD-10-CM | POA: Diagnosis not present

## 2018-08-28 DIAGNOSIS — Z791 Long term (current) use of non-steroidal anti-inflammatories (NSAID): Secondary | ICD-10-CM | POA: Diagnosis not present

## 2018-08-28 DIAGNOSIS — Z85828 Personal history of other malignant neoplasm of skin: Secondary | ICD-10-CM | POA: Diagnosis not present

## 2018-08-28 DIAGNOSIS — M109 Gout, unspecified: Secondary | ICD-10-CM | POA: Diagnosis not present

## 2018-08-28 DIAGNOSIS — Z8249 Family history of ischemic heart disease and other diseases of the circulatory system: Secondary | ICD-10-CM | POA: Diagnosis not present

## 2018-08-28 DIAGNOSIS — M1990 Unspecified osteoarthritis, unspecified site: Secondary | ICD-10-CM | POA: Diagnosis not present

## 2018-08-31 ENCOUNTER — Telehealth: Payer: Self-pay | Admitting: Gastroenterology

## 2018-08-31 NOTE — Telephone Encounter (Signed)
Pt called stating that he has been having diarrhea for the past 10 days, he stated that he has had c-diff before. pls call him.

## 2018-09-01 ENCOUNTER — Other Ambulatory Visit: Payer: Self-pay

## 2018-09-01 ENCOUNTER — Other Ambulatory Visit: Payer: Medicare HMO

## 2018-09-01 DIAGNOSIS — R197 Diarrhea, unspecified: Secondary | ICD-10-CM

## 2018-09-01 DIAGNOSIS — A498 Other bacterial infections of unspecified site: Secondary | ICD-10-CM

## 2018-09-01 NOTE — Telephone Encounter (Signed)
Patient is notified. He will come today to the lab for the instructions and the stool collection kit.

## 2018-09-01 NOTE — Telephone Encounter (Signed)
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

## 2018-09-01 NOTE — Telephone Encounter (Signed)
Past 8 to 10 days he has had watery stools at least 3 a day. States "foul smelling like last time but I don't have the diarrhea to the degree I did last time." Mild discomfort of the abdomen. Appetite is less than his usual. He would like to have a stool study to rule out infection.

## 2018-09-02 ENCOUNTER — Other Ambulatory Visit: Payer: Medicare HMO

## 2018-09-02 DIAGNOSIS — R197 Diarrhea, unspecified: Secondary | ICD-10-CM

## 2018-09-02 DIAGNOSIS — A498 Other bacterial infections of unspecified site: Secondary | ICD-10-CM | POA: Diagnosis not present

## 2018-09-03 LAB — GASTROINTESTINAL PATHOGEN PANEL PCR
C. difficile Tox A/B, PCR: NOT DETECTED
Campylobacter, PCR: NOT DETECTED
Cryptosporidium, PCR: NOT DETECTED
E coli (ETEC) LT/ST PCR: NOT DETECTED
E coli (STEC) stx1/stx2, PCR: NOT DETECTED
E coli 0157, PCR: NOT DETECTED
Giardia lamblia, PCR: NOT DETECTED
Norovirus, PCR: NOT DETECTED
Rotavirus A, PCR: NOT DETECTED
Salmonella, PCR: NOT DETECTED
Shigella, PCR: NOT DETECTED

## 2018-09-04 ENCOUNTER — Telehealth: Payer: Self-pay | Admitting: Gastroenterology

## 2018-09-04 NOTE — Telephone Encounter (Signed)
Patient wants stool sample results

## 2018-09-04 NOTE — Telephone Encounter (Signed)
Spoke with pts wife and she is aware an knows to call back if no improvement next week.

## 2018-09-04 NOTE — Telephone Encounter (Signed)
No infection detected, including C difficile (which he had 2 yrs ago) That test is very sensitive, and should pick up C diff if present.    Could have gotten some viral infection with protracted symptoms.   Imodium is OK to take to control diarrhea.    If diarrhea not improved next week, let us know and we will do a second stool test to be sure.

## 2018-09-04 NOTE — Telephone Encounter (Signed)
Pt calling for GI Path results.

## 2018-09-07 ENCOUNTER — Telehealth: Payer: Self-pay | Admitting: Gastroenterology

## 2018-09-07 NOTE — Telephone Encounter (Signed)
Pt states the imodium helped when he took it Saturday but he hasn't taken it since Saturday. Reports he is having diarrhea this morning and wants to know what to do. Discussed with pt that he needs to continue taking the Imodium.

## 2018-09-07 NOTE — Telephone Encounter (Signed)
Pt. Called in and stated that he took the imodium that was recommended by dr. Lovenia Kim on Saturday which help however the symptoms are back and pat  Is wanting to know what he needs to do now

## 2018-09-10 ENCOUNTER — Telehealth: Payer: Self-pay | Admitting: Gastroenterology

## 2018-09-10 ENCOUNTER — Other Ambulatory Visit: Payer: Medicare HMO

## 2018-09-10 DIAGNOSIS — R197 Diarrhea, unspecified: Secondary | ICD-10-CM

## 2018-09-10 MED ORDER — VANCOMYCIN HCL 250 MG PO CAPS: 250.0000 mg | ORAL_CAPSULE | Freq: Four times a day (QID) | ORAL | 0 refills | Status: DC

## 2018-09-10 NOTE — Telephone Encounter (Signed)
Spoke with pt and he is aware, order in for lab, script sent to pharmacy.

## 2018-09-10 NOTE — Telephone Encounter (Signed)
Patient states that he has been having probs with diarrhea, did not make it to the restroom

## 2018-09-10 NOTE — Telephone Encounter (Signed)
Pt states he is taking Imodium but still having 2-3 diarrhea stools/day. States this am he didn't make it to the bathroom and had a big mess. Pt wants to know what else he can do, wonders if he needs to be tested for Cdiff again. Please advise.

## 2018-09-10 NOTE — Telephone Encounter (Signed)
Yes, I am very concerned he has C diff again, even with negative stool toxin test last week.  Please arrange stool for C diff PCR, and send prescription for vancomycin 250 mg, one capsule 4 times daily  for 21 days.  I want him to give the stool sample today AND start the vancomycin.  If tests positive for C diff, will need to continue vancomycin and we would arrange another evaluation with Infectious Disease.

## 2018-09-11 ENCOUNTER — Other Ambulatory Visit: Payer: Medicare HMO

## 2018-09-11 DIAGNOSIS — R197 Diarrhea, unspecified: Secondary | ICD-10-CM

## 2018-09-12 LAB — CLOSTRIDIUM DIFFICILE BY PCR: Toxigenic C. Difficile by PCR: NEGATIVE

## 2018-09-14 ENCOUNTER — Telehealth: Payer: Self-pay | Admitting: Gastroenterology

## 2018-09-14 NOTE — Telephone Encounter (Signed)
The pt has been advised as soon as Dr Loletha Carrow reviews we will call with recommendations.

## 2018-09-14 NOTE — Telephone Encounter (Signed)
Patient requesting a call back when stool test results from 12.13.19 come back.

## 2018-09-16 ENCOUNTER — Telehealth: Payer: Self-pay | Admitting: Gastroenterology

## 2018-09-16 DIAGNOSIS — A498 Other bacterial infections of unspecified site: Secondary | ICD-10-CM

## 2018-09-16 MED ORDER — FIDAXOMICIN 200 MG PO TABS: 200.0000 mg | ORAL_TABLET | Freq: Two times a day (BID) | ORAL | 0 refills | Status: DC

## 2018-09-16 NOTE — Telephone Encounter (Signed)
Notes recorded by Shane Stabler, Shane [name] on 09/15/2018 at 9:18 PM EST Shane [name],  This patient will be coming to see you again - perhaps you could try to expedite. Prior FMT with [place] - now clinically with C diff again. Stool testing negative, but behaving just like it did before and responded to vancomycin. Going to put him on fidaxomicin. ------  Notes recorded by Shane Stabler, Shane [name] on 09/15/2018 at 9:16 PM EST We will try a different antibiotic. 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  Fidaxomicin 200 mg tablet. One tablet twice daily for 10 days. Disp #20 tablets, RF zero  He also needs to be seen by Dr. Carlyle [name] of Infectious Disease - she saw him before  Spoke with patient and he is aware, script sent to pharmacy, referral in epic for ID.

## 2018-09-17 ENCOUNTER — Encounter: Payer: Self-pay | Admitting: Infectious Diseases

## 2018-09-17 ENCOUNTER — Ambulatory Visit: Payer: Medicare HMO | Admitting: Infectious Diseases

## 2018-09-17 DIAGNOSIS — I10 Essential (primary) hypertension: Secondary | ICD-10-CM | POA: Diagnosis not present

## 2018-09-17 DIAGNOSIS — A498 Other bacterial infections of unspecified site: Secondary | ICD-10-CM

## 2018-09-17 MED ORDER — VANCOMYCIN HCL 125 MG PO CAPS: ORAL_CAPSULE | ORAL | 0 refills | Status: AC

## 2018-09-17 MED FILL — VANCOMYCIN HCL 125 MG CAP: 125 | 56 days supply | Qty: 49 | Fill #0

## 2018-09-17 NOTE — Assessment & Plan Note (Signed)
He is currently asx, will ask him to f/u with his PCP.

## 2018-09-17 NOTE — Assessment & Plan Note (Signed)
He appears to have a recurrence (clinically).  He does appear to be doing well clinically, not septic.  Will get him 3 more weeks of po vanco via [employer].  Will try to get back in with Dr Baxter Flattery for repeat transplant.

## 2018-09-17 NOTE — Progress Notes (Signed)
   Subjective:    Patient ID: Shane [name], male    DOB: 27-May-1932, 84 y.o.   MRN: [id number]  HPI 84 yo M with diarrhea for several weeks. He was treated previously for C diff (on and off rx for 1 year) that required fecal transplant by Dr Baxter Flattery (10-2016).  He had previous esophageal dilation. Then 2 weeks later he developed loose Bm 2-3/day. He called his GI MD and had C diff testing done (-). He was treated with imodium with minimal relief.    He has been on po vancomycin since 12-12, was rx for 21 days. He found out that rx was $1000 and he then got half the rx for $500.  He also has a rx for difcid that he has not filled (cost $1200).  He had repeat C diff test (-) at time he started po vanco.  He has felt better since starting po vanco. He has not had formed BM, but not as liquid as previous.   The past medical history, family history and social history were reviewed/updated in EPIC   Review of Systems  Constitutional: Negative for chills, fever and unexpected weight change.  Respiratory: Negative for cough and shortness of breath.   Cardiovascular: Negative for chest pain.  Gastrointestinal: Positive for diarrhea. Negative for abdominal pain, blood in stool and constipation.  Genitourinary: Negative for difficulty urinating.  Neurological: Negative for headaches.  Please see HPI. All other systems reviewed and negative.      Objective:   Physical Exam Constitutional:      Appearance: Normal appearance.  HENT:     Head: Normocephalic.     Mouth/Throat:     Mouth: Mucous membranes are moist.     Pharynx: No oropharyngeal exudate.  Eyes:     Extraocular Movements: Extraocular movements intact.     Pupils: Pupils are equal, round, and reactive to light.  Neck:     Musculoskeletal: Normal range of motion and neck supple.  Cardiovascular:     Rate and Rhythm: Normal rate and regular rhythm.  Pulmonary:     Effort: Pulmonary effort is normal.     Breath sounds: Normal  breath sounds.  Abdominal:     General: Bowel sounds are normal. There is no distension.     Palpations: Abdomen is soft.     Tenderness: There is no abdominal tenderness.  Musculoskeletal:        General: No swelling.  Neurological:     Mental Status: He is alert and oriented to person, place, and time.  Psychiatric:        Mood and Affect: Mood normal.        Assessment & Plan:

## 2018-10-09 ENCOUNTER — Ambulatory Visit: Payer: Medicare HMO | Admitting: Gastroenterology

## 2018-10-09 ENCOUNTER — Encounter: Payer: Self-pay | Admitting: Gastroenterology

## 2018-10-09 VITALS — BP 136/72 | HR 64 | Ht 69.0 in | Wt 193.2 lb

## 2018-10-09 DIAGNOSIS — K222 Esophageal obstruction: Secondary | ICD-10-CM | POA: Diagnosis not present

## 2018-10-09 DIAGNOSIS — R1314 Dysphagia, pharyngoesophageal phase: Secondary | ICD-10-CM | POA: Diagnosis not present

## 2018-10-09 DIAGNOSIS — A498 Other bacterial infections of unspecified site: Secondary | ICD-10-CM

## 2018-10-09 DIAGNOSIS — R197 Diarrhea, unspecified: Secondary | ICD-10-CM

## 2018-10-09 NOTE — Progress Notes (Signed)
[facility] Progress Note  Chief Complaint: Dysphagia  Subjective  History:  Shane [name] saw me in mid October for dysphagia, and upper endoscopy on 08/11/2018 revealed ulcerative esophagitis with a stricture that underwent balloon dilation. He then contacted [place] a few weeks later with profuse watery diarrhea reminiscent of his previous C. difficile infection.  C. difficile testing was negative, but I elected to put him on vancomycin and his symptoms significantly improved.  The diarrhea recurred after stopping the vancomycin, so I referred him to infectious disease.  He was seen by Dr. Johnnye Sima on December 19, who placed him on a long course of vancomycin. He is currently on once daily vancomycin, soon to taper even further. Stool is formed, with no abdominal cramps, bleeding or weight loss.  ROS: Cardiovascular:  no chest pain Respiratory: no dyspnea  The patient's Past Medical, Family and Social History were reviewed and are on file in the EMR.  Objective:  Med list reviewed  Current Outpatient Medications:  .  acetaminophen (TYLENOL) 500 MG tablet, Take 500-1,000 mg by mouth every 6 (six) hours as needed (for pain)., Disp: , Rfl:  .  allopurinol (ZYLOPRIM) 300 MG tablet, Take 1 tablet (300 mg total) by mouth daily., Disp: 90 tablet, Rfl: 0 .  atenolol (TENORMIN) 25 MG tablet, Take 1 tablet (25 mg total) by mouth daily., Disp: 90 tablet, Rfl: 3 .  chlorthalidone (HYGROTON) 25 MG tablet, Take 0.5 tablets (12.5 mg total) by mouth daily., Disp: 45 tablet, Rfl: 3 .  dutasteride (AVODART) 0.5 MG capsule, Take 1 capsule (0.5 mg total) by mouth daily., Disp: 90 capsule, Rfl: 3 .  ibuprofen (ADVIL,MOTRIN) 200 MG tablet, Take 200-600 mg by mouth every 6 (six) hours as needed (for pain)., Disp: , Rfl:  .  Multiple Vitamins-Minerals (PRESERVISION AREDS 2) CAPS, Take 1 capsule by mouth 2 (two) times daily., Disp: , Rfl:  .  omeprazole (PRILOSEC) 40 MG capsule, Take 1 capsule (40 mg  total) by mouth daily., Disp: 90 capsule, Rfl: 3 .  vancomycin (VANCOCIN) 125 MG capsule, Take 1 capsule (125 mg total) by mouth 2 (two) times daily for 7 days, THEN 1 capsule (125 mg total) daily for 21 days, THEN 1 capsule (125 mg total) every other day for 28 days., Disp: 49 capsule, Rfl: 0   Vital signs in last 24 hrs: Vitals:   10/09/18 1341  BP: 136/72  Pulse: 64    Physical Exam  He is well appearing and accompanied by his wife  HEENT: sclera anicteric, oral mucosa moist without lesions  Neck: supple, no thyromegaly, JVD or lymphadenopathy  Cardiac: RRR without murmurs, S1S2 heard, no peripheral edema  Pulm: clear to auscultation bilaterally, normal RR and effort noted  Abdomen: soft, no tenderness, with active bowel sounds. No guarding or palpable hepatosplenomegaly.  Skin; warm and dry, no jaundice or rash  Recent Labs:  C. difficile toxin and PCR negative last month.  @ASSESSMENTPLANBEGIN @ Assessment: Encounter Diagnoses  Name Primary?  Marland Kitchen Dysphagia, pharyngoesophageal Yes  . Esophageal stricture   . Clostridium difficile infection   . 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  He also has esophageal dysphagia from a reflux induced stricture that is improved after dilation.  He will still get intermittent dysphagia with meat, so I recommended he be extremely careful about that.  His wife says that he sometimes eats  too large portion or too quickly.  If things escalate to how they were before, repeat upper endoscopy with further dilation can be performed.  He will take once daily omeprazole indefinitely.  Hopefully his C. difficile will be cleared with this long tapering course of vancomycin.  If not, he will need another fecal microbiota transplant.  Dr. Graylon Good of infectious disease will keep me updated, and we have been communicating about  [name].  Total time 30 minutes, over half spent face-to-face with patient in counseling and coordination of care.   [name]

## 2018-10-09 NOTE — Patient Instructions (Signed)
If you are age 83 or older, your body mass index should be between 23-30. Your Body mass index is 28.53 kg/m. If this is out of the aforementioned range listed, please consider follow up with your Primary Care Provider.  If you are age 11 or younger, your body mass index should be between 19-25. Your Body mass index is 28.53 kg/m. If this is out of the aformentioned range listed, please consider follow up with your Primary Care Provider.   Follow up as needed.   Keep your appointments with ID as scheduled.  It was a pleasure to see you today!  Dr. Loletha Carrow

## 2018-10-28 ENCOUNTER — Other Ambulatory Visit: Payer: Self-pay | Admitting: Adult Health

## 2018-10-28 DIAGNOSIS — M109 Gout, unspecified: Secondary | ICD-10-CM

## 2018-10-28 NOTE — Telephone Encounter (Signed)
Sent to the pharmacy by e-scribe. 

## 2018-11-02 ENCOUNTER — Encounter: Payer: Self-pay | Admitting: Internal Medicine

## 2018-11-02 ENCOUNTER — Ambulatory Visit: Payer: Medicare HMO | Admitting: Internal Medicine

## 2018-11-02 VITALS — BP 148/78 | HR 55 | Temp 97.7°F | Wt 194.0 lb

## 2018-11-02 DIAGNOSIS — A0471 Enterocolitis due to Clostridium difficile, recurrent: Secondary | ICD-10-CM | POA: Diagnosis not present

## 2018-11-02 NOTE — Patient Instructions (Signed)
After you finish every other day schedule.  Take 1 dose every 3 days x 8 doses

## 2018-11-02 NOTE — Progress Notes (Signed)
RFV: follow up for recurrent cdifficile  Patient ID: Shane [name], male   DOB: March 21, 1932, 85 y.o.   MRN: [id number]  HPI  Mr. Mitton is an 85yo M with recurrent cdifficile, who underwent FMT on 11/19/2016 by Dr Loletha Carrow and myself. He would have period flares of diarrhea where he took imodium in 2018. Then in late November, beginning of December started to have profuse diarrhea. 12/4 GIP was negative ( but cdiff not reported on the assay). He was given imodium wihtout much improvement. cdiff pcr tested on 12/13 which was negative. He was given empiric oral vancomycin for which his  improved.   Day 15 over every other day #7 tabs left of Day 28.  "waiting for the other shoe to drop"  Bowel habits:2 stool per day - loosely formed. Has BM roughly 30-40 min after breakfast. But doesn't have urgency after every meal vs.  when he was on daily dosing of vanco, he had 1-2 formed sotols    Outpatient Encounter Medications as of 11/02/2018  Medication Sig  . acetaminophen (TYLENOL) 500 MG tablet Take 500-1,000 mg by mouth every 6 (six) hours as needed (for pain).  Marland Kitchen allopurinol (ZYLOPRIM) 300 MG tablet TAKE 1 TABLET BY MOUTH EVERY DAY  . atenolol (TENORMIN) 25 MG tablet Take 1 tablet (25 mg total) by mouth daily.  . chlorthalidone (HYGROTON) 25 MG tablet Take 0.5 tablets (12.5 mg total) by mouth daily.  Marland Kitchen dutasteride (AVODART) 0.5 MG capsule Take 1 capsule (0.5 mg total) by mouth daily.  Marland Kitchen ibuprofen (ADVIL,MOTRIN) 200 MG tablet Take 200-600 mg by mouth every 6 (six) hours as needed (for pain).  . Multiple Vitamins-Minerals (PRESERVISION AREDS 2) CAPS Take 1 capsule by mouth 2 (two) times daily.  Marland Kitchen omeprazole (PRILOSEC) 40 MG capsule Take 1 capsule (40 mg total) by mouth daily.  . vancomycin (VANCOCIN) 125 MG capsule Take 1 capsule (125 mg total) by mouth 2 (two) times daily for 7 days, THEN 1 capsule (125 mg total) daily for 21 days, THEN 1 capsule (125 mg total) every other day for 28 days.    No facility-administered encounter medications on file as of 11/02/2018.      Patient Active Problem List   Diagnosis Date Noted  . Clostridium difficile infection   . Hypertriglyceridemia 12/01/2015  . EXTRINSIC ASTHMA, UNSPECIFIED 01/11/2009  . SPINAL STENOSIS 05/24/2008  . PROSTATE SPECIFIC ANTIGEN, ELEVATED 05/24/2008  . OBESITY 08/13/2006  . ANEMIA-IRON DEFICIENCY 08/13/2006  . Essential hypertension 08/13/2006  . ALLERGIC RHINITIS 08/13/2006  . GERD 08/13/2006  . HIATAL HERNIA 08/13/2006  . DEGENERATIVE JOINT DISEASE 08/13/2006   Social History   Tobacco Use  . Smoking status: Former Smoker    Years: 35.00    Types: Cigarettes  . Smokeless tobacco: Never Used  . Tobacco comment: quit late 1980's  Substance Use Topics  . Alcohol use: No  . Drug use: No   There are no preventive care reminders to display for this patient.   Review of Systems Review of Systems  Constitutional: Negative for fever, chills, diaphoresis, activity change, appetite change, fatigue and unexpected weight change.  HENT: Negative for congestion, sore throat, rhinorrhea, sneezing, trouble swallowing and sinus pressure.  Eyes: Negative for photophobia and visual disturbance.  Respiratory: Negative for cough, chest tightness, shortness of breath, wheezing and stridor.  Cardiovascular: Negative for chest pain, palpitations and leg swelling.  Gastrointestinal: occ dysphagia.Negative for nausea, vomiting, abdominal pain, diarrhea, constipation, blood in stool, abdominal distention and anal bleeding.  Genitourinary: Negative for dysuria, hematuria, flank pain and difficulty urinating.  Musculoskeletal: Negative for myalgias, back pain, joint swelling, arthralgias and gait problem.  Skin: Negative for color change, pallor, rash and wound.  Neurological: Negative for dizziness, tremors, weakness and light-headedness.  Hematological: Negative for adenopathy. Does not bruise/bleed easily.   Psychiatric/Behavioral: Negative for behavioral problems, confusion, sleep disturbance, dysphoric mood, decreased concentration and agitation.    Physical Exam   BP (!) 148/78   Pulse (!) 55   Temp 97.7 F (36.5 C)   Wt 194 lb (88 kg)   BMI 28.65 kg/m    Physical Exam  Constitutional: He is oriented to person, place, and time. He appears well-developed and well-nourished. No distress.  HENT:  Mouth/Throat: Oropharynx is clear and moist. No oropharyngeal exudate.  Cardiovascular: Normal rate, regular rhythm and normal heart sounds. Exam reveals no gallop and no friction rub.  No murmur heard.  Pulmonary/Chest: Effort normal and breath sounds normal. No respiratory distress. He has no wheezes.  Abdominal: Soft. Bowel sounds are normal. He exhibits no distension. There is no tenderness.  Neurological: He is alert and oriented to person, place, and time.  Skin: Skin is warm and dry. No rash noted. No erythema.  Psychiatric: He has a normal mood and affect. His behavior is normal.    CBC Lab Results  Component Value Date   WBC 6.3 06/09/2018   RBC 4.24 06/09/2018   HGB 13.0 06/09/2018   HCT 39.0 06/09/2018   PLT 162.0 06/09/2018   MCV 92.0 06/09/2018   MCH 30.4 05/19/2016   MCHC 33.4 06/09/2018   RDW 14.7 06/09/2018   LYMPHSABS 1.4 06/09/2018   MONOABS 0.6 06/09/2018   EOSABS 0.5 06/09/2018    BMET Lab Results  Component Value Date   NA 139 07/08/2018   K 4.9 07/08/2018   CL 106 07/08/2018   CO2 25 07/08/2018   GLUCOSE 93 07/08/2018   BUN 51 (H) 07/08/2018   CREATININE 1.73 (H) 07/08/2018   CALCIUM 9.0 07/08/2018   GFRNONAA 33 (L) 05/19/2016   GFRAA 38 (L) 05/19/2016      Assessment and Plan  Recurrent cdifficile - continues to respond to vancomycin and currently on taper of 1 pill every other day x 2 more weeks Recommend to take yogurt with probiotics daily since he has some pill dysphagia  Will do a taper of 1 pill every 3 days after he completes  current course x 14 d  Then will see him back off of medications  In terms of interpretation of labs - it is possible that he has toxin a strain which is not detected on pcr For future testing I would do the EIA plus cdiff pcr to see if the toxin production (From A or B) can be detected  If he has another relapse, would see if he qualifies for fidaxomicin, plus would do FMT

## 2018-12-03 ENCOUNTER — Ambulatory Visit: Payer: Medicare HMO | Admitting: Internal Medicine

## 2018-12-04 DIAGNOSIS — R69 Illness, unspecified: Secondary | ICD-10-CM | POA: Diagnosis not present

## 2018-12-09 DIAGNOSIS — H353123 Nonexudative age-related macular degeneration, left eye, advanced atrophic without subfoveal involvement: Secondary | ICD-10-CM | POA: Diagnosis not present

## 2018-12-09 DIAGNOSIS — H353212 Exudative age-related macular degeneration, right eye, with inactive choroidal neovascularization: Secondary | ICD-10-CM | POA: Diagnosis not present

## 2018-12-09 DIAGNOSIS — H35372 Puckering of macula, left eye: Secondary | ICD-10-CM | POA: Diagnosis not present

## 2018-12-09 DIAGNOSIS — H353114 Nonexudative age-related macular degeneration, right eye, advanced atrophic with subfoveal involvement: Secondary | ICD-10-CM | POA: Diagnosis not present

## 2018-12-09 DIAGNOSIS — H43813 Vitreous degeneration, bilateral: Secondary | ICD-10-CM | POA: Diagnosis not present

## 2019-01-12 ENCOUNTER — Ambulatory Visit: Payer: Medicare HMO | Admitting: Infectious Diseases

## 2019-02-08 ENCOUNTER — Other Ambulatory Visit: Payer: Self-pay | Admitting: *Deleted

## 2019-02-08 MED ORDER — VANCOMYCIN HCL 125 MG PO CAPS: 125.0000 mg | ORAL_CAPSULE | Freq: Three times a day (TID) | ORAL | 0 refills | Status: DC

## 2019-02-08 NOTE — Telephone Encounter (Signed)
Per verbal from Dr Baxter Flattery called the patient to advise he is to start a Vancomycin taper for his diarrhea. She advised me to send in Vanc 125 1 tablet 3 times daily for 14 days; then 1 tablet 2 times daily for 14 days until she sees him in the office 03/02/19. The patient is aware and the Rx has been sent to the pharmacy of his choice.

## 2019-02-19 ENCOUNTER — Telehealth: Payer: Self-pay | Admitting: *Deleted

## 2019-02-19 NOTE — Telephone Encounter (Signed)
Patient called to report that since starting the vanc taper he is still having 2 watery stools a day. He states in the past when he starts the taper it stops the diarrhea and wants to make sure he is fine. Advised will ask the provider and let him know. Asked how many stools he was having prior to taper and he states 5-7 a day some times more. Advised will ask the doctor and give him a call back.

## 2019-03-02 ENCOUNTER — Encounter: Payer: Self-pay | Admitting: Internal Medicine

## 2019-03-02 ENCOUNTER — Ambulatory Visit (INDEPENDENT_AMBULATORY_CARE_PROVIDER_SITE_OTHER): Payer: Medicare HMO | Admitting: Internal Medicine

## 2019-03-02 ENCOUNTER — Other Ambulatory Visit: Payer: Self-pay

## 2019-03-02 VITALS — BP 158/78 | HR 61 | Temp 97.7°F | Wt 190.0 lb

## 2019-03-02 DIAGNOSIS — A0471 Enterocolitis due to Clostridium difficile, recurrent: Secondary | ICD-10-CM

## 2019-03-02 MED ORDER — VANCOMYCIN HCL 125 MG PO CAPS: 125.0000 mg | ORAL_CAPSULE | Freq: Two times a day (BID) | ORAL | 0 refills | Status: DC

## 2019-03-02 NOTE — Progress Notes (Signed)
RFV: follow up for recurrent cdifficile  Patient ID: Shane [name], male   DOB: May 14, 1932, 85 y.o.   MRN: [id number]  HPI  85yo M with recurrent cdifficile infection. He had prolonged taper which ended in mid march. He started to notice having intermittent watery stools; thus we had empirically started him back onto oral vanco 125mg  TID. He has noticed having some improved. Now has Semi formed stool, has 2 bm per day. No fevers, chills, n./v/ weight loss  Outpatient Encounter Medications as of 03/02/2019  Medication Sig  . acetaminophen (TYLENOL) 500 MG tablet Take 500-1,000 mg by mouth every 6 (six) hours as needed (for pain).  Marland Kitchen allopurinol (ZYLOPRIM) 300 MG tablet TAKE 1 TABLET BY MOUTH EVERY DAY  . atenolol (TENORMIN) 25 MG tablet Take 1 tablet (25 mg total) by mouth daily.  . chlorthalidone (HYGROTON) 25 MG tablet Take 0.5 tablets (12.5 mg total) by mouth daily.  Marland Kitchen dutasteride (AVODART) 0.5 MG capsule Take 1 capsule (0.5 mg total) by mouth daily.  Marland Kitchen ibuprofen (ADVIL,MOTRIN) 200 MG tablet Take 200-600 mg by mouth every 6 (six) hours as needed (for pain).  . Multiple Vitamins-Minerals (PRESERVISION AREDS 2) CAPS Take 1 capsule by mouth 2 (two) times daily.  Marland Kitchen omeprazole (PRILOSEC) 40 MG capsule Take 1 capsule (40 mg total) by mouth daily.  . vancomycin (VANCOCIN) 125 MG capsule Take 1 capsule (125 mg total) by mouth 3 (three) times daily.   No facility-administered encounter medications on file as of 03/02/2019.      Patient Active Problem List   Diagnosis Date Noted  . Clostridium difficile infection   . Hypertriglyceridemia 12/01/2015  . EXTRINSIC ASTHMA, UNSPECIFIED 01/11/2009  . SPINAL STENOSIS 05/24/2008  . PROSTATE SPECIFIC ANTIGEN, ELEVATED 05/24/2008  . OBESITY 08/13/2006  . ANEMIA-IRON DEFICIENCY 08/13/2006  . Essential hypertension 08/13/2006  . ALLERGIC RHINITIS 08/13/2006  . GERD 08/13/2006  . HIATAL HERNIA 08/13/2006  . DEGENERATIVE JOINT DISEASE 08/13/2006     There are no preventive care reminders to display for this patient.   Review of Systems  Physical Exam   BP (!) 158/78   Pulse 61   Temp 97.7 F (36.5 C)   Wt 190 lb (86.2 kg)   BMI 28.06 kg/m    Physical Exam  Constitutional: He is oriented to person, place, and time. He appears well-developed and well-nourished. No distress.  HENT:  Mouth/Throat: Oropharynx is clear and moist. No oropharyngeal exudate.  Cardiovascular: Normal rate, regular rhythm and normal heart sounds. Exam reveals no gallop and no friction rub.  No murmur heard.  Pulmonary/Chest: Effort normal and breath sounds normal. No respiratory distress. He has no wheezes.  Abdominal: Soft. Bowel sounds are normal. He exhibits no distension. There is no tenderness.  Lymphadenopathy:  He has no cervical adenopathy.  Neurological: He is alert and oriented to person, place, and time.  Skin: Skin is warm and dry. No rash noted. No erythema.  Psychiatric: He has a normal mood and affect. His behavior is normal.    CBC Lab Results  Component Value Date   WBC 6.3 06/09/2018   RBC 4.24 06/09/2018   HGB 13.0 06/09/2018   HCT 39.0 06/09/2018   PLT 162.0 06/09/2018   MCV 92.0 06/09/2018   MCH 30.4 05/19/2016   MCHC 33.4 06/09/2018   RDW 14.7 06/09/2018   LYMPHSABS 1.4 06/09/2018   MONOABS 0.6 06/09/2018   EOSABS 0.5 06/09/2018    BMET Lab Results  Component Value Date   NA  139 07/08/2018   K 4.9 07/08/2018   CL 106 07/08/2018   CO2 25 07/08/2018   GLUCOSE 93 07/08/2018   BUN 51 (H) 07/08/2018   CREATININE 1.73 (H) 07/08/2018   CALCIUM 9.0 07/08/2018   GFRNONAA 33 (L) 05/19/2016   GFRAA 38 (L) 05/19/2016      Assessment and Plan  Recurrent cdifficile = - plan on long term suppression vs. Monoclonal antibody, does not want to do FMT. If hae has increase BM, we recommend to retest by PCR to ensure recurrent episode vs post infectious process

## 2019-03-16 ENCOUNTER — Other Ambulatory Visit: Payer: Self-pay

## 2019-03-16 ENCOUNTER — Ambulatory Visit: Payer: Medicare HMO | Admitting: Internal Medicine

## 2019-04-01 ENCOUNTER — Other Ambulatory Visit: Payer: Self-pay

## 2019-04-01 ENCOUNTER — Ambulatory Visit (INDEPENDENT_AMBULATORY_CARE_PROVIDER_SITE_OTHER): Payer: Medicare HMO | Admitting: Internal Medicine

## 2019-04-01 DIAGNOSIS — A0471 Enterocolitis due to Clostridium difficile, recurrent: Secondary | ICD-10-CM | POA: Diagnosis not present

## 2019-04-01 NOTE — Progress Notes (Signed)
RFV: follow up for recurrent cdifficile  Patient ID: Shane [name], male   DOB: 1932/02/24, 85 y.o.   MRN: [id number]  HPI 85yo M with recurrent c.difficile s/p FMT but subsequently had relapse. He has been on prolonged oral vancomycin taper. Currently taking oral vanco daily. Has had formed stools, 1 bm per day usually for the last month. Feeling better overall.  No recent illnesses/abtx   Outpatient Encounter Medications as of 04/01/2019  Medication Sig  . acetaminophen (TYLENOL) 500 MG tablet Take 500-1,000 mg by mouth every 6 (six) hours as needed (for pain).  Marland Kitchen allopurinol (ZYLOPRIM) 300 MG tablet TAKE 1 TABLET BY MOUTH EVERY DAY  . atenolol (TENORMIN) 25 MG tablet Take 1 tablet (25 mg total) by mouth daily.  . chlorthalidone (HYGROTON) 25 MG tablet Take 0.5 tablets (12.5 mg total) by mouth daily.  Marland Kitchen dutasteride (AVODART) 0.5 MG capsule Take 1 capsule (0.5 mg total) by mouth daily.  Marland Kitchen ibuprofen (ADVIL,MOTRIN) 200 MG tablet Take 200-600 mg by mouth every 6 (six) hours as needed (for pain).  . Multiple Vitamins-Minerals (PRESERVISION AREDS 2) CAPS Take 1 capsule by mouth 2 (two) times daily.  Marland Kitchen omeprazole (PRILOSEC) 40 MG capsule Take 1 capsule (40 mg total) by mouth daily.  . vancomycin (VANCOCIN) 125 MG capsule Take 1 capsule (125 mg total) by mouth 2 (two) times daily. X 14 day then decrease to once a daily   No facility-administered encounter medications on file as of 04/01/2019.      Patient Active Problem List   Diagnosis Date Noted  . Clostridium difficile infection   . Hypertriglyceridemia 12/01/2015  . EXTRINSIC ASTHMA, UNSPECIFIED 01/11/2009  . SPINAL STENOSIS 05/24/2008  . PROSTATE SPECIFIC ANTIGEN, ELEVATED 05/24/2008  . OBESITY 08/13/2006  . ANEMIA-IRON DEFICIENCY 08/13/2006  . Essential hypertension 08/13/2006  . ALLERGIC RHINITIS 08/13/2006  . GERD 08/13/2006  . HIATAL HERNIA 08/13/2006  . DEGENERATIVE JOINT DISEASE 08/13/2006     There are no preventive  care reminders to display for this patient.   Review of Systems  Constitutional: Negative for fever, chills, diaphoresis, activity change, appetite change, fatigue and unexpected weight change.  HENT: Negative for congestion, sore throat, rhinorrhea, sneezing, trouble swallowing and sinus pressure.  Eyes: Negative for photophobia and visual disturbance.  Respiratory: Negative for cough, chest tightness, shortness of breath, wheezing and stridor.  Cardiovascular: Negative for chest pain, palpitations and leg swelling.  Gastrointestinal: Negative for nausea, vomiting, abdominal pain, diarrhea, constipation, blood in stool, abdominal distention and anal bleeding.  Genitourinary: Negative for dysuria, hematuria, flank pain and difficulty urinating.  Musculoskeletal: Negative for myalgias, back pain, joint swelling, arthralgias and gait problem.  Skin: Negative for color change, pallor, rash and wound.  Neurological: Negative for dizziness, tremors, weakness and light-headedness.  Hematological: Negative for adenopathy. Does not bruise/bleed easily.  Psychiatric/Behavioral: Negative for behavioral problems, confusion, sleep disturbance, dysphoric mood, decreased concentration and agitation.    Physical Exam   No exam: CBC Lab Results  Component Value Date   WBC 6.3 06/09/2018   RBC 4.24 06/09/2018   HGB 13.0 06/09/2018   HCT 39.0 06/09/2018   PLT 162.0 06/09/2018   MCV 92.0 06/09/2018   MCH 30.4 05/19/2016   MCHC 33.4 06/09/2018   RDW 14.7 06/09/2018   LYMPHSABS 1.4 06/09/2018   MONOABS 0.6 06/09/2018   EOSABS 0.5 06/09/2018    BMET Lab Results  Component Value Date   NA 139 07/08/2018   K 4.9 07/08/2018   CL 106 07/08/2018  CO2 25 07/08/2018   GLUCOSE 93 07/08/2018   BUN 51 (H) 07/08/2018   CREATININE 1.73 (H) 07/08/2018   CALCIUM 9.0 07/08/2018   GFRNONAA 33 (L) 05/19/2016   GFRAA 38 (L) 05/19/2016      Assessment and Plan Recurrent cdifficile = has 16 tabs left.  Will advise to Take every other day. Check back  Next thur to see if any difference  Doing well. Formed stool x 1 month

## 2019-05-11 DIAGNOSIS — H353114 Nonexudative age-related macular degeneration, right eye, advanced atrophic with subfoveal involvement: Secondary | ICD-10-CM | POA: Diagnosis not present

## 2019-05-11 DIAGNOSIS — H353123 Nonexudative age-related macular degeneration, left eye, advanced atrophic without subfoveal involvement: Secondary | ICD-10-CM | POA: Diagnosis not present

## 2019-05-11 DIAGNOSIS — H353212 Exudative age-related macular degeneration, right eye, with inactive choroidal neovascularization: Secondary | ICD-10-CM | POA: Diagnosis not present

## 2019-05-11 DIAGNOSIS — H35372 Puckering of macula, left eye: Secondary | ICD-10-CM | POA: Diagnosis not present

## 2019-05-17 ENCOUNTER — Telehealth: Payer: Self-pay | Admitting: *Deleted

## 2019-05-17 DIAGNOSIS — A0471 Enterocolitis due to Clostridium difficile, recurrent: Secondary | ICD-10-CM

## 2019-05-17 NOTE — Telephone Encounter (Signed)
Patient called to report that after he stopped his oral Vanc 05/03/19 his diarrhea has started back. He advised it was 2 to 3 a day at first and now he is having 4 or more loose stools a day and thinks it is CDiff. He wants Dr Baxter Flattery to know as she told him if it comes back to call the office. Advised will let provider know and someone will call him back soon.

## 2019-05-18 ENCOUNTER — Other Ambulatory Visit: Payer: Self-pay | Admitting: *Deleted

## 2019-05-18 ENCOUNTER — Other Ambulatory Visit: Payer: Self-pay

## 2019-05-18 ENCOUNTER — Other Ambulatory Visit: Payer: Medicare HMO

## 2019-05-18 DIAGNOSIS — A0471 Enterocolitis due to Clostridium difficile, recurrent: Secondary | ICD-10-CM | POA: Diagnosis not present

## 2019-05-18 MED ORDER — VANCOMYCIN HCL 125 MG PO CAPS: 125.0000 mg | ORAL_CAPSULE | Freq: Four times a day (QID) | ORAL | 0 refills | Status: DC

## 2019-05-18 NOTE — Telephone Encounter (Signed)
Per Verbal from Dr Baxter Flattery called patient and advised to bring sample for testing. She also wants him to restart the oral Vanc 125 mg 1 cap 4 times a day until we get results back. Patient verbalized he understands and will bring sample by this afternoon or in the morning when he is able to go. Asked him to call before he comes so we can make sure we are ready for him.

## 2019-05-18 NOTE — Addendum Note (Signed)
Addended by: Reggy Eye on: 05/18/2019 11:24 AM   Modules accepted: Orders

## 2019-05-19 LAB — CLOSTRIDIUM DIFFICILE TOXIN B, QUALITATIVE, REAL-TIME PCR: Toxigenic C. Difficile by PCR: NOT DETECTED

## 2019-05-24 ENCOUNTER — Telehealth: Payer: Self-pay

## 2019-05-24 NOTE — Telephone Encounter (Signed)
Patient called office today requesting lab results from Stool kit from last week. Patient would also like to know if he needs to schedule follow up appointment as well. Informed patient that as of now results are negative. Will route message to MD to advise on follow up appointment. [place]

## 2019-05-25 ENCOUNTER — Other Ambulatory Visit: Payer: Self-pay | Admitting: Adult Health

## 2019-05-25 NOTE — Telephone Encounter (Signed)
How about we see him in 5 wk. Let him know to call us if he has issues before then

## 2019-05-26 ENCOUNTER — Encounter: Payer: Self-pay | Admitting: Family Medicine

## 2019-05-26 NOTE — Telephone Encounter (Signed)
[place], verified idenity with 2 identifiers, scheduled follow up appointment for 06/30/2019 at 8:45 am.   I reiterated that if he felt like he needed to be seen sooner he could give our office a call.   Patient had no further questions or concerns.     Lenore Cord[place], [place]

## 2019-05-26 NOTE — Telephone Encounter (Signed)
Sent to the pharmacy by e-scribe for 90 days.  Letter released to MyChart. 

## 2019-06-16 ENCOUNTER — Other Ambulatory Visit: Payer: Self-pay

## 2019-06-16 ENCOUNTER — Encounter: Payer: Self-pay | Admitting: Adult Health

## 2019-06-16 ENCOUNTER — Ambulatory Visit (INDEPENDENT_AMBULATORY_CARE_PROVIDER_SITE_OTHER): Payer: Medicare HMO | Admitting: Adult Health

## 2019-06-16 VITALS — BP 118/62 | HR 70 | Temp 97.9°F | Wt 183.6 lb

## 2019-06-16 DIAGNOSIS — N183 Chronic kidney disease, stage 3 unspecified: Secondary | ICD-10-CM | POA: Insufficient documentation

## 2019-06-16 DIAGNOSIS — Z0000 Encounter for general adult medical examination without abnormal findings: Secondary | ICD-10-CM

## 2019-06-16 DIAGNOSIS — I10 Essential (primary) hypertension: Secondary | ICD-10-CM | POA: Diagnosis not present

## 2019-06-16 DIAGNOSIS — R972 Elevated prostate specific antigen [PSA]: Secondary | ICD-10-CM | POA: Diagnosis not present

## 2019-06-16 DIAGNOSIS — E781 Pure hyperglyceridemia: Secondary | ICD-10-CM

## 2019-06-16 DIAGNOSIS — M109 Gout, unspecified: Secondary | ICD-10-CM | POA: Diagnosis not present

## 2019-06-16 DIAGNOSIS — N400 Enlarged prostate without lower urinary tract symptoms: Secondary | ICD-10-CM

## 2019-06-16 DIAGNOSIS — Z23 Encounter for immunization: Secondary | ICD-10-CM

## 2019-06-16 LAB — CBC WITH DIFFERENTIAL/PLATELET
Basophils Absolute: 0 10*3/uL (ref 0.0–0.1)
Basophils Relative: 0.5 % (ref 0.0–3.0)
Eosinophils Absolute: 0.2 10*3/uL (ref 0.0–0.7)
Eosinophils Relative: 3.6 % (ref 0.0–5.0)
HCT: 36.7 % — ABNORMAL LOW (ref 39.0–52.0)
Hemoglobin: 12 g/dL — ABNORMAL LOW (ref 13.0–17.0)
Lymphocytes Relative: 17.8 % (ref 12.0–46.0)
Lymphs Abs: 1.2 10*3/uL (ref 0.7–4.0)
MCHC: 32.6 g/dL (ref 30.0–36.0)
MCV: 93.8 fl (ref 78.0–100.0)
Monocytes Absolute: 0.6 10*3/uL (ref 0.1–1.0)
Monocytes Relative: 8.4 % (ref 3.0–12.0)
Neutro Abs: 4.8 10*3/uL (ref 1.4–7.7)
Neutrophils Relative %: 69.7 % (ref 43.0–77.0)
Platelets: 154 10*3/uL (ref 150.0–400.0)
RBC: 3.91 Mil/uL — ABNORMAL LOW (ref 4.22–5.81)
RDW: 15.4 % (ref 11.5–15.5)
WBC: 6.8 10*3/uL (ref 4.0–10.5)

## 2019-06-16 LAB — COMPREHENSIVE METABOLIC PANEL
ALT: 13 U/L (ref 0–53)
AST: 22 U/L (ref 0–37)
Albumin: 3.9 g/dL (ref 3.5–5.2)
Alkaline Phosphatase: 33 U/L — ABNORMAL LOW (ref 39–117)
BUN: 46 mg/dL — ABNORMAL HIGH (ref 6–23)
CO2: 21 mEq/L (ref 19–32)
Calcium: 9 mg/dL (ref 8.4–10.5)
Chloride: 111 mEq/L (ref 96–112)
Creatinine, Ser: 1.97 mg/dL — ABNORMAL HIGH (ref 0.40–1.50)
GFR: 32.48 mL/min — ABNORMAL LOW (ref >60.00)
Glucose, Bld: 88 mg/dL (ref 70–99)
Potassium: 4.8 mEq/L (ref 3.5–5.1)
Sodium: 141 mEq/L (ref 135–145)
Total Bilirubin: 0.7 mg/dL (ref 0.2–1.2)
Total Protein: 6.5 g/dL (ref 6.0–8.3)

## 2019-06-16 LAB — LIPID PANEL
Cholesterol: 169 mg/dL (ref 0–200)
HDL: 34.5 mg/dL — ABNORMAL LOW (ref >39.00)
LDL Cholesterol: 112 mg/dL — ABNORMAL HIGH (ref 0–99)
NonHDL: 134.99
Total CHOL/HDL Ratio: 5
Triglycerides: 113 mg/dL (ref 0.0–149.0)
VLDL: 22.6 mg/dL (ref 0.0–40.0)

## 2019-06-16 LAB — TSH: TSH: 2.26 u[IU]/mL (ref 0.35–4.50)

## 2019-06-16 LAB — PSA: PSA: 2.85 ng/mL (ref 0.10–4.00)

## 2019-06-16 NOTE — Progress Notes (Signed)
Subjective:    Patient ID: Shane [name], male    DOB: [birth year], 85 y.o.   MRN: [id number]  HPI Patient presents for yearly preventative medicine examination. He is a pleasant 85 year old male who  has a past medical history of Allergy, Anemia, iron deficiency, C. difficile diarrhea, Cancer ([place]), Cataract, DDD (degenerative disc disease), cervical, Dysplastic nevus of left upper extremity, Elevated PSA, Essential hypertension, Extrinsic asthma, GERD (gastroesophageal reflux disease), Gout, Hiatal hernia, Macular degeneration, Neuromuscular disorder ([place]), Pneumonia, and Spinal stenosis.  Essential Hypertension -currently prescribed atenolol 50 mg and chlorthalidone 12.5  mg daily.  He denies dizziness, lightheadedness, chest pain, shortness of breath BP Readings from Last 3 Encounters:  06/16/19 118/62  03/02/19 (!) 158/78  11/02/18 (!) 148/78   H/o gout -takes allopurinol 300 mg daily.  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 He finished up vanco course two weeks ago. Reports that he does have some lower abdominal pain intermittent - this is his cue he needs to find a bathroom. Most of the time this happens to be gas. He has not had any diarrhea but his stools are soft.He has not had a " normal movement in some time". His last negative C diff test was 4 weeks ago.    All immunizations and health maintenance protocols were reviewed with the patient and needed orders were placed. Due for yearly influenza vaccination  Appropriate screening laboratory values were ordered for the patient including screening of hyperlipidemia, renal function and hepatic function. If indicated by BPH, a PSA  was ordered.  Medication reconciliation,  past medical history, social history, problem list and allergies were reviewed in detail with the patient  Goals were established with regard to weight loss, exercise, and  diet in compliance with medications. He stays active and eats healthy.   End of life planning was discussed.   Review of Systems  Constitutional: Negative.   HENT: Negative.   Eyes: Negative.   Respiratory: Negative.   Cardiovascular: Negative.   Gastrointestinal: Positive for abdominal pain. Negative for abdominal distention, anal bleeding, blood in stool, constipation, diarrhea, nausea and vomiting.  Endocrine: Negative.   Genitourinary: Negative.   Musculoskeletal: Negative.   Skin: Negative.   Allergic/Immunologic: Negative.   Neurological: Negative.   Hematological: Negative.   Psychiatric/Behavioral: Negative.   All other systems reviewed and are negative.  Past Medical History:  Diagnosis Date  . Allergy   . Anemia, iron deficiency   . C. difficile diarrhea   . 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 Cataract    bil cataracts removed  . DDD (degenerative disc disease), cervical   . Dysplastic nevus of left upper extremity   . Elevated PSA   . Essential hypertension   . Extrinsic asthma   . GERD (gastroesophageal reflux disease)    no recent  . Gout   . Hiatal hernia   . Macular degeneration   . Neuromuscular disorder ([place])    hiatal hernia  . Pneumonia    2014ish  . Spinal stenosis     Social History   Socioeconomic History  . Marital status:  Married    Spouse name: Not on file  . Number of children: 3  . Years of education: Not on file  . Highest education level: Not on file  Occupational History  . Occupation: retired  Scientific laboratory technician  . Financial resource strain: Not on file  . Food insecurity    Worry: Not on file    Inability: Not on file  . Transportation needs    Medical: Not on file    Non-medical: Not on file  Tobacco Use  .  Smoking status: Former Smoker    Years: 35.00    Types: Cigarettes  . Smokeless tobacco: Never Used  . Tobacco comment: quit late 1980's  Substance and Sexual Activity  . Alcohol use: No  . Drug use: No  . Sexual activity: Not on file  Lifestyle  . Physical activity    Days per week: Not on file    Minutes per session: Not on file  . Stress: Not on file  Relationships  . Social Herbalist on phone: Not on file    Gets together: Not on file    Attends religious service: Not on file    Active member of club or organization: Not on file    Attends meetings of clubs or organizations: Not on file    Relationship status: Not on file  . Intimate partner violence    Fear of current or ex partner: Not on file    Emotionally abused: Not on file    Physically abused: Not on file    Forced sexual activity: Not on file  Other Topics Concern  . Not on file  Social History Narrative   Retired from Psychologist, educational    Married for 55 years    Three children all live locally    [place] to play golf and go to [employer].     Past Surgical History:  Procedure Laterality Date  . BACK SURGERY    . Bone Spur Right    Shoulder  . COLONOSCOPY    . COLONOSCOPY W/ POLYPECTOMY    . COLONOSCOPY WITH PROPOFOL N/A 11/19/2016   Procedure: COLONOSCOPY WITH PROPOFOL;  Surgeon: Doran Stabler, MD;  Location: [facility];  Service: Gastroenterology;  Laterality: N/A;  . FECAL TRANSPLANT N/A 11/19/2016   Procedure: FECAL TRANSPLANT;  Surgeon: Doran Stabler, MD;  Location: [facility];  Service: Gastroenterology;  Laterality: N/A;  . POLYPECTOMY    . SKIN CANCER EXCISION      Family History  Problem Relation Age of Onset  . Multiple myeloma Brother   . Prostate cancer Brother   . Hypertension Father        ?  . Lung cancer Sister   . Colon cancer Neg Hx   . Stomach cancer Neg Hx   . Esophageal cancer Neg Hx   . Rectal cancer Neg Hx     No Known Allergies  Current Outpatient  Medications on File Prior to Visit  Medication Sig Dispense Refill  . acetaminophen (TYLENOL) 500 MG tablet Take 500-1,000 mg by mouth every 6 (six) hours as needed (for pain).    Marland Kitchen allopurinol (ZYLOPRIM) 300 MG tablet TAKE 1 TABLET BY MOUTH EVERY DAY 90 tablet 2  . atenolol (TENORMIN) 25 MG tablet Take 1 tablet (25 mg total) by mouth daily. 90 tablet 3  . chlorthalidone (HYGROTON) 25 MG tablet Take 0.5 tablets (12.5 mg total) by mouth daily. 45 tablet 3  . dutasteride (AVODART)  0.5 MG capsule TAKE 1 CAPSULE BY MOUTH EVERY DAY 90 capsule 0  . ibuprofen (ADVIL,MOTRIN) 200 MG tablet Take 200-600 mg by mouth every 6 (six) hours as needed (for pain).    . Multiple Vitamins-Minerals (PRESERVISION AREDS 2) CAPS Take 1 capsule by mouth 2 (two) times daily.    Marland Kitchen omeprazole (PRILOSEC) 40 MG capsule Take 1 capsule (40 mg total) by mouth daily. 90 capsule 3  . vancomycin (VANCOCIN) 125 MG capsule Take 1 capsule (125 mg total) by mouth 4 (four) times daily. X 14 days 60 capsule 0   No current facility-administered medications on file prior to visit.     BP 118/62 (BP Location: Left Arm, Patient Position: Sitting, Cuff Size: Normal)   Pulse 70   Temp 97.9 F (36.6 C) (Temporal)   Wt 183 lb 9.6 oz (83.3 kg)   SpO2 96%   BMI 27.11 kg/m       Objective:   Physical Exam Vitals signs and nursing note reviewed.  Constitutional:      General: He is not in acute distress.    Appearance: He is not diaphoretic.  HENT:     Head: Normocephalic and atraumatic.     Right Ear: Tympanic membrane, ear canal and external ear normal. There is no impacted cerumen.     Left Ear: Tympanic membrane, ear canal and external ear normal. There is no impacted cerumen.     Nose: Nose normal. No congestion or rhinorrhea.     Mouth/Throat:     Mouth: Mucous membranes are moist.     Pharynx: Oropharynx is clear. No oropharyngeal exudate or posterior oropharyngeal erythema.  Eyes:     General: No scleral icterus.        Right eye: No discharge.        Left eye: No discharge.     Extraocular Movements: Extraocular movements intact.     Conjunctiva/sclera: Conjunctivae normal.     Pupils: Pupils are equal, round, and reactive to light.  Neck:     Musculoskeletal: Normal range of motion and neck supple.     Thyroid: No thyromegaly.     Vascular: No JVD.     Trachea: No tracheal deviation.  Cardiovascular:     Rate and Rhythm: Normal rate and regular rhythm.     Pulses: Normal pulses.     Heart sounds: Normal heart sounds. No murmur. No friction rub. No gallop.   Pulmonary:     Effort: Pulmonary effort is normal. No respiratory distress.     Breath sounds: Normal breath sounds. No stridor. No wheezing, rhonchi or rales.  Chest:     Chest wall: No tenderness.  Abdominal:     General: Abdomen is flat. Bowel sounds are normal. There is no distension.     Palpations: Abdomen is soft. There is no mass.     Tenderness: There is abdominal tenderness in the periumbilical area. There is no right CVA tenderness, left CVA tenderness, guarding or rebound.     Hernia: No hernia is present.  Musculoskeletal: Normal range of motion.        General: No swelling, tenderness, deformity or signs of injury.     Right lower leg: No edema.     Left lower leg: No edema.  Lymphadenopathy:     Cervical: No cervical adenopathy.  Skin:    General: Skin is warm and dry.     Capillary Refill: Capillary refill takes less than 2 seconds.     Coloration:  Skin is not jaundiced or pale.     Findings: No bruising, erythema, lesion or rash.  Neurological:     General: No focal deficit present.     Mental Status: He is alert and oriented to person, place, and time. Mental status is at baseline.     Cranial Nerves: No cranial nerve deficit.     Motor: No abnormal muscle tone.     Coordination: Coordination normal.     Deep Tendon Reflexes: Reflexes are normal and symmetric. Reflexes normal.  Psychiatric:        Mood and Affect:  Mood normal.        Behavior: Behavior normal.        Thought Content: Thought content normal.        Judgment: Judgment normal.        Assessment & Plan:  1. Routine general medical examination at a health care facility - Continue to stay active and exercise - CBC with Differential/Platelet - Comprehensive metabolic panel - Lipid panel - TSH  2. Chronic kidney disease (CKD), stage III (moderate) (HCC) - Consider referral to nephrology  - CBC with Differential/Platelet - Comprehensive metabolic panel - Lipid panel - TSH  3. Essential hypertension - Well controlled. No change in medications  - CBC with Differential/Platelet - Comprehensive metabolic panel - Lipid panel - TSH  4. Gout, unspecified cause, unspecified chronicity, unspecified site - Continue with Allopurinol   5. Benign prostatic hyperplasia without lower urinary tract symptoms - Well controlled. No change in medications   6. Hypertriglyceridemia - Consider statin - CBC with Differential/Platelet - Comprehensive metabolic panel - Lipid panel - TSH  7. BPH   - PSA  Dorothyann Peng, NP

## 2019-06-16 NOTE — Patient Instructions (Signed)
It was great seeing you today!  We will follow up with you regarding your blood work   Please continue to stay active and exercise

## 2019-06-16 NOTE — Addendum Note (Signed)
Addended by: Rebecca Eaton on: 06/16/2019 01:32 PM   Modules accepted: Orders

## 2019-06-17 ENCOUNTER — Other Ambulatory Visit: Payer: Self-pay | Admitting: Adult Health

## 2019-06-17 DIAGNOSIS — N289 Disorder of kidney and ureter, unspecified: Secondary | ICD-10-CM

## 2019-06-29 ENCOUNTER — Other Ambulatory Visit (INDEPENDENT_AMBULATORY_CARE_PROVIDER_SITE_OTHER): Payer: Medicare HMO

## 2019-06-29 ENCOUNTER — Other Ambulatory Visit: Payer: Self-pay

## 2019-06-29 DIAGNOSIS — N289 Disorder of kidney and ureter, unspecified: Secondary | ICD-10-CM

## 2019-06-29 LAB — BASIC METABOLIC PANEL
BUN: 38 mg/dL — ABNORMAL HIGH (ref 6–23)
CO2: 19 mEq/L (ref 19–32)
Calcium: 8.7 mg/dL (ref 8.4–10.5)
Chloride: 108 mEq/L (ref 96–112)
Creatinine, Ser: 1.8 mg/dL — ABNORMAL HIGH (ref 0.40–1.50)
GFR: 36.04 mL/min — ABNORMAL LOW (ref >60.00)
Glucose, Bld: 105 mg/dL — ABNORMAL HIGH (ref 70–99)
Potassium: 4.2 mEq/L (ref 3.5–5.1)
Sodium: 136 mEq/L (ref 135–145)

## 2019-06-30 ENCOUNTER — Ambulatory Visit: Payer: Medicare HMO | Admitting: Internal Medicine

## 2019-06-30 ENCOUNTER — Other Ambulatory Visit: Payer: Medicare HMO

## 2019-06-30 ENCOUNTER — Encounter: Payer: Self-pay | Admitting: Internal Medicine

## 2019-06-30 VITALS — BP 142/67 | HR 64

## 2019-06-30 DIAGNOSIS — A09 Infectious gastroenteritis and colitis, unspecified: Secondary | ICD-10-CM | POA: Diagnosis not present

## 2019-06-30 DIAGNOSIS — A0471 Enterocolitis due to Clostridium difficile, recurrent: Secondary | ICD-10-CM | POA: Diagnosis not present

## 2019-06-30 NOTE — Progress Notes (Signed)
Patient ID: Shane [name], male   DOB: 07-06-1932, 85 y.o.   MRN: [id number]  HPI 85yo M with history of recurrent cdifficile s/p FMT with subsequent relapse, previously had been on prolonged oral vanco taper, and then just taking vancomycin daily mostly 1 formed stool daily, overall improved. He did have episode of diarrhea for which he was retested and cdifficile testing was negative. He has stopped his daily dose of oral vancomycin roughly 3 wks ago.  He states that in the last 3-4 days having increasing irregularity with bowel movements, urgency then has flatus, but 2 days ago having frequent watery diarrhea with abdominal cramping. No blood in stool. No n/v nor fever or chills  He took 2 immodium which helped but still having some loose stools  Outpatient Encounter Medications as of 06/30/2019  Medication Sig  . acetaminophen (TYLENOL) 500 MG tablet Take 500-1,000 mg by mouth every 6 (six) hours as needed (for pain).  Marland Kitchen allopurinol (ZYLOPRIM) 300 MG tablet TAKE 1 TABLET BY MOUTH EVERY DAY  . atenolol (TENORMIN) 25 MG tablet Take 1 tablet (25 mg total) by mouth daily.  . chlorthalidone (HYGROTON) 25 MG tablet Take 0.5 tablets (12.5 mg total) by mouth daily.  Marland Kitchen dutasteride (AVODART) 0.5 MG capsule TAKE 1 CAPSULE BY MOUTH EVERY DAY  . ibuprofen (ADVIL,MOTRIN) 200 MG tablet Take 200-600 mg by mouth every 6 (six) hours as needed (for pain).  . Multiple Vitamins-Minerals (PRESERVISION AREDS 2) CAPS Take 1 capsule by mouth 2 (two) times daily.  Marland Kitchen omeprazole (PRILOSEC) 40 MG capsule Take 1 capsule (40 mg total) by mouth daily.  . vancomycin (VANCOCIN) 125 MG capsule Take 1 capsule (125 mg total) by mouth 4 (four) times daily. X 14 days   No facility-administered encounter medications on file as of 06/30/2019.      Patient Active Problem List   Diagnosis Date Noted  . Chronic kidney disease (CKD), stage III (moderate) ([place]) 06/16/2019  . Clostridium difficile infection   .  Hypertriglyceridemia 12/01/2015  . EXTRINSIC ASTHMA, UNSPECIFIED 01/11/2009  . SPINAL STENOSIS 05/24/2008  . PROSTATE SPECIFIC ANTIGEN, ELEVATED 05/24/2008  . OBESITY 08/13/2006  . ANEMIA-IRON DEFICIENCY 08/13/2006  . Essential hypertension 08/13/2006  . ALLERGIC RHINITIS 08/13/2006  . GERD 08/13/2006  . HIATAL HERNIA 08/13/2006  . DEGENERATIVE JOINT DISEASE 08/13/2006     Social History   Tobacco Use  . Smoking status: Former Smoker    Years: 35.00    Types: Cigarettes  . Smokeless tobacco: Never Used  . Tobacco comment: quit late 1980's  Substance Use Topics  . Alcohol use: No  . Drug use: No  family history includes Hypertension in his father; Lung cancer in his sister; Multiple myeloma in his brother; Prostate cancer in his brother. Review of Systems 12 point ros is negative except what is mentioned in hpi Physical Exam   BP (!) 142/67   Pulse 64  Physical Exam  Constitutional: He is oriented to person, place, and time. He appears well-developed and well-nourished. No distress.  HENT:  Mouth/Throat: Oropharynx is clear and moist. No oropharyngeal exudate.  Cardiovascular: Normal rate, regular rhythm and normal heart sounds. Exam reveals no gallop and no friction rub.  No murmur heard.  Pulmonary/Chest: Effort normal and breath sounds normal. No respiratory distress. He has no wheezes.  Abdominal: Soft.protuberant abdomen Bowel sounds are decreased. He exhibits mild distension. There is no tenderness.       CBC Lab Results  Component Value Date  WBC 6.8 06/16/2019   RBC 3.91 (L) 06/16/2019   HGB 12.0 (L) 06/16/2019   HCT 36.7 (L) 06/16/2019   PLT 154.0 06/16/2019   MCV 93.8 06/16/2019   MCH 30.4 05/19/2016   MCHC 32.6 06/16/2019   RDW 15.4 06/16/2019   LYMPHSABS 1.2 06/16/2019   MONOABS 0.6 06/16/2019   EOSABS 0.2 06/16/2019    BMET Lab Results  Component Value Date   NA 136 06/29/2019   K 4.2 06/29/2019   CL 108 06/29/2019   CO2 19 06/29/2019    GLUCOSE 105 (H) 06/29/2019   BUN 38 (H) 06/29/2019   CREATININE 1.80 (H) 06/29/2019   CALCIUM 8.7 06/29/2019   GFRNONAA 33 (L) 05/19/2016   GFRAA 38 (L) 05/19/2016   05/18/19 cdiff pcr negative 08/2018 cdiff negative  Assessment and Plan   Recurrent diarrhea concerning for relapse for c.difficile vs. Post infectious IBS = we will retest is stool for cdifficile PCR to see if having recurrence. If recurrence, then start oral vancomycin 155m QID x 10d followed by taper. Did discuss zimplava to treat since unable to do repeat FMT given FDA restriction during covid-19.  Spent 25 min with patient with greater than 50% in face to face time

## 2019-07-01 LAB — CLOSTRIDIUM DIFFICILE TOXIN B, QUALITATIVE, REAL-TIME PCR: Toxigenic C. Difficile by PCR: NOT DETECTED

## 2019-07-02 ENCOUNTER — Telehealth: Payer: Self-pay

## 2019-07-02 NOTE — Telephone Encounter (Signed)
Patient called concerned about his cdiff test. Made aware that test was negative.  Patient states he has had some increased loose stools the past 2-3 days and like Dr. Baxter Flattery advise on taking Imodium for relief.  Routing to provider to make aware. Eugenia Mcalpine

## 2019-07-13 NOTE — Telephone Encounter (Signed)
Patient called to report that he is still having diarrhea 2-4 times a day and wants to know what he should do. Advised provider is not in office but will get her the message and someone will call him back once she responds.

## 2019-07-14 NOTE — Telephone Encounter (Signed)
Patient called of stating he is still having diarrhea. States that his most recent episode was this morning. Patient would like to know if he should be taking anything to help with recurrent diarrhea episodes or if he should see Dr. Baxter Flattery or PCP soon regarding this issue. Will forward message to MD. Aundria Rud, [place]

## 2019-07-17 ENCOUNTER — Other Ambulatory Visit: Payer: Self-pay | Admitting: Adult Health

## 2019-07-17 DIAGNOSIS — M109 Gout, unspecified: Secondary | ICD-10-CM

## 2019-07-26 ENCOUNTER — Other Ambulatory Visit: Payer: Self-pay | Admitting: Gastroenterology

## 2019-07-26 DIAGNOSIS — R69 Illness, unspecified: Secondary | ICD-10-CM | POA: Diagnosis not present

## 2019-07-29 ENCOUNTER — Other Ambulatory Visit: Payer: Self-pay | Admitting: Adult Health

## 2019-07-29 DIAGNOSIS — I10 Essential (primary) hypertension: Secondary | ICD-10-CM

## 2019-07-30 NOTE — Telephone Encounter (Signed)
Sent to the pharmacy by e-scribe. 

## 2019-08-02 ENCOUNTER — Ambulatory Visit: Payer: Medicare HMO | Admitting: Gastroenterology

## 2019-08-02 ENCOUNTER — Other Ambulatory Visit: Payer: Self-pay

## 2019-08-02 ENCOUNTER — Encounter: Payer: Self-pay | Admitting: Gastroenterology

## 2019-08-02 VITALS — BP 138/80 | HR 79 | Temp 98.2°F | Ht 70.0 in | Wt 184.0 lb

## 2019-08-02 DIAGNOSIS — R197 Diarrhea, unspecified: Secondary | ICD-10-CM | POA: Diagnosis not present

## 2019-08-02 DIAGNOSIS — A498 Other bacterial infections of unspecified site: Secondary | ICD-10-CM

## 2019-08-02 MED ORDER — BUDESONIDE 3 MG PO CPEP: 9.0000 mg | ORAL_CAPSULE | Freq: Every day | ORAL | 1 refills | Status: DC

## 2019-08-02 NOTE — Progress Notes (Signed)
[facility] Progress Note  Chief Complaint: Chronic diarrhea  Subjective  History: Previous refractory C. difficile, underwent F MT February 2018 with good response.  Unfortunately, relapsed with diarrhea late last year, stool C. difficile PCR negative, but high clinical suspicion so vancomycin taper was resumed by Dr. Graylon Good of infectious disease.  He then stayed on once daily vancomycin through the summer, and after stopping it had slow recurrence of loose stools when last seen by Dr. Graylon Good in September.  Stool PCR again negative at that time.  Colonoscopy December 2017 after initial C. difficile treatment showed lymphocytic colitis (though that turned out to be when the patient had persistent C. difficile, as evidenced by subsequent positive PCR in January 2018)  [place] has continued to struggle with diarrhea.  Earlier this year he was on about a 6-week course of vancomycin, and says his diarrhea completely resolved, and stayed away for perhaps a month or 2.  Diarrhea slowly recurred, and he was put back on lower doses of vancomycin, always causing improvement in diarrhea.  Over the last couple of months when diarrhea recurs, the vancomycin has "settle down" but not completely resolved it.  He has not been on it for at least several weeks, and has 3-4 watery BMs per day with urgency and some "flecks of stool".  He denies rectal bleeding, has some lower abdominal bloating with bowel movements but no real pain.  Denies nausea vomiting or weight loss.  He was frustrated by this ongoing difficult course, and did not want to wait until his upcoming appointment with Dr. Baxter Flattery, so contacted our office for an evaluation.  ROS: Cardiovascular:  no chest pain Respiratory: no dyspnea Chronic arthralgias Remainder of systems negative except as above The patient's Past Medical, Family and Social History were reviewed and are on file in the EMR.  Objective:  Med list reviewed  Current  Outpatient Medications:  .  acetaminophen (TYLENOL) 500 MG tablet, Take 500-1,000 mg by mouth every 6 (six) hours as needed (for pain)., Disp: , Rfl:  .  allopurinol (ZYLOPRIM) 300 MG tablet, TAKE 1 TABLET BY MOUTH EVERY DAY, Disp: 90 tablet, Rfl: 3 .  atenolol (TENORMIN) 25 MG tablet, TAKE 1 TABLET BY MOUTH EVERY DAY, Disp: 90 tablet, Rfl: 3 .  chlorthalidone (HYGROTON) 25 MG tablet, TAKE 1/2 TABLET BY MOUTH EVERY DAY, Disp: 45 tablet, Rfl: 3 .  dutasteride (AVODART) 0.5 MG capsule, TAKE 1 CAPSULE BY MOUTH EVERY DAY, Disp: 90 capsule, Rfl: 0 .  ibuprofen (ADVIL,MOTRIN) 200 MG tablet, Take 200-600 mg by mouth every 6 (six) hours as needed (for pain)., Disp: , Rfl:  .  Multiple Vitamins-Minerals (PRESERVISION AREDS 2) CAPS, Take 1 capsule by mouth 2 (two) times daily., Disp: , Rfl:  .  omeprazole (PRILOSEC) 40 MG capsule, TAKE 1 CAPSULE BY MOUTH EVERY DAY, Disp: 90 capsule, Rfl: 3   Vital signs in last 24 hrs: Vitals:   08/02/19 1033  BP: 138/80  Pulse: 79  Temp: 98.2 F (36.8 C)    Physical Exam  He is well-appearing with good muscle mass and well-hydrated.  HEENT: sclera anicteric, oral mucosa moist without lesions  Neck: supple, no thyromegaly, JVD or lymphadenopathy  Cardiac: RRR without murmurs, S1S2 heard, no peripheral edema  Pulm: clear to auscultation bilaterally, normal RR and effort noted  Abdomen: soft, no tenderness, with active bowel sounds. No guarding or palpable hepatosplenomegaly.  Skin; warm and dry, no jaundice or rash  Recent Labs:  C. difficile  PCR results over this year were reviewed    @ASSESSMENTPLANBEGIN @ Assessment: Encounter Diagnoses  Name Primary?  . Diarrhea, unspecified type Yes  . Clostridium difficile infection    Chronic diarrhea from recurrence of C. difficile.  Repeat PCR testing has been negative despite continued symptoms and improvement in symptoms when he takes vancomycin.  It should be noted he had a negative PCR test at least  once in the past when it turned out that he had ongoing C. difficile subsequent positive test.  Therefore, the possibility of a false negative test should be considered here.  I feel that F MT would therefore be reasonable in this situation, but it cannot currently be performed due to COVID-19 restrictions.  With negative PCR, I do not know whether Zimplava would be warranted.  He may have ongoing lymphocytic colitis related to present or prior C. difficile, so I think we should treat that and also try once daily align probiotic in addition to the yogurt he has been consuming lately.   Plan: Align 1 capsule daily for 6 weeks Budesonide 3 mg capsule, 3 capsules once daily for 4 weeks.  If it is improving symptoms, then continue for another 4 weeks.  I will forward my note to Dr. Baxter Flattery to open a dialogue about Mr. Schaum long-term plan.   Total time 30 minutes, over half spent face-to-face with patient in counseling and coordination of care.   [name]

## 2019-08-02 NOTE — Patient Instructions (Signed)
If you are age 83 or older, your body mass index should be between 23-30. Your Body mass index is 26.4 kg/m. If this is out of the aforementioned range listed, please consider follow up with your Primary Care Provider.  If you are age 69 or younger, your body mass index should be between 19-25. Your Body mass index is 26.4 kg/m. If this is out of the aformentioned range listed, please consider follow up with your Primary Care Provider.   Medication Samples have been provided to the patient.  Drug name: Patty Sermons: 63  LOT: [id number] Exp.Date: 05/2021  Dosing instructions: Take one capsule daily for 6 weeks  The patient has been instructed regarding the correct time, dose, and frequency of taking this medication, including desired effects and most common side effects.    It was a pleasure to see you today!  Dr. Loletha Carrow

## 2019-08-10 ENCOUNTER — Telehealth: Payer: Self-pay

## 2019-08-10 NOTE — Telephone Encounter (Signed)
COVID-19 Pre-Screening Questions:08/10/19   Do you currently have a fever (>100 F), chills or unexplained body aches? NO  Are you currently experiencing new cough, shortness of breath, sore throat, runny nose? NO .  Have you recently travelled outside the state of [place] in the last 14 days? NO  .  Have you been in contact with someone that is currently pending confirmation of Covid19 testing or has been confirmed to have the [place] virus?  NO   **If the patient answers NO to ALL questions -  advise the patient to please call the clinic before coming to the office should any symptoms develop.

## 2019-08-11 ENCOUNTER — Encounter: Payer: Self-pay | Admitting: Internal Medicine

## 2019-08-11 ENCOUNTER — Other Ambulatory Visit: Payer: Self-pay

## 2019-08-11 ENCOUNTER — Ambulatory Visit: Payer: Medicare HMO | Admitting: Internal Medicine

## 2019-08-11 VITALS — Wt 189.2 lb

## 2019-08-11 DIAGNOSIS — K52832 Lymphocytic colitis: Secondary | ICD-10-CM | POA: Diagnosis not present

## 2019-08-11 DIAGNOSIS — A0471 Enterocolitis due to Clostridium difficile, recurrent: Secondary | ICD-10-CM

## 2019-08-11 NOTE — Patient Instructions (Signed)
We will see back in 6-8 wk on telephone visit  Call me at [phone] if you have worsening diarrhea

## 2019-08-11 NOTE — Progress Notes (Signed)
RFV: for recurrent cdifficile. Patient ID: Shane [name], male   DOB: [birth year], 85 y.o.   MRN: [id number]  HPI [name] is a 85yo M with hx of Recurrent cdiff-- Doing better on enterocort. Dr Loletha Carrow evaluated to think he may have lymphocytic enteritis. Having occasional loose stool but not as perfuse as when he had cdifficile infection Outpatient Encounter Medications as of 08/11/2019  Medication Sig  . acetaminophen (TYLENOL) 500 MG tablet Take 500-1,000 mg by mouth every 6 (six) hours as needed (for pain).  Marland Kitchen allopurinol (ZYLOPRIM) 300 MG tablet TAKE 1 TABLET BY MOUTH EVERY DAY  . atenolol (TENORMIN) 25 MG tablet TAKE 1 TABLET BY MOUTH EVERY DAY  . budesonide (ENTOCORT EC) 3 MG 24 hr capsule Take 3 capsules (9 mg total) by mouth daily.  . chlorthalidone (HYGROTON) 25 MG tablet TAKE 1/2 TABLET BY MOUTH EVERY DAY  . dutasteride (AVODART) 0.5 MG capsule TAKE 1 CAPSULE BY MOUTH EVERY DAY  . ibuprofen (ADVIL,MOTRIN) 200 MG tablet Take 200-600 mg by mouth every 6 (six) hours as needed (for pain).  . Multiple Vitamins-Minerals (PRESERVISION AREDS 2) CAPS Take 1 capsule by mouth 2 (two) times daily.  Marland Kitchen omeprazole (PRILOSEC) 40 MG capsule TAKE 1 CAPSULE BY MOUTH EVERY DAY   No facility-administered encounter medications on file as of 08/11/2019.      Patient Active Problem List   Diagnosis Date Noted  . Chronic kidney disease (CKD), stage III (moderate) 06/16/2019  . Clostridium difficile infection   . Hypertriglyceridemia 12/01/2015  . EXTRINSIC ASTHMA, UNSPECIFIED 01/11/2009  . SPINAL STENOSIS 05/24/2008  . PROSTATE SPECIFIC ANTIGEN, ELEVATED 05/24/2008  . OBESITY 08/13/2006  . ANEMIA-IRON DEFICIENCY 08/13/2006  . Essential hypertension 08/13/2006  . ALLERGIC RHINITIS 08/13/2006  . GERD 08/13/2006  . HIATAL HERNIA 08/13/2006  . DEGENERATIVE JOINT DISEASE 08/13/2006   Social History   Tobacco Use  . Smoking status: Former Smoker    Years: 35.00    Types: Cigarettes  .  Smokeless tobacco: Never Used  . Tobacco comment: quit late 1980's  Substance Use Topics  . Alcohol use: No  . Drug use: No    There are no preventive care reminders to display for this patient.   Review of Systems 12 point ros is negative except what is mentioned in hpi Physical Exam   Wt 189 lb 3.2 oz (85.8 kg)   BMI 27.15 kg/m  Physical Exam  Constitutional: He is oriented to person, place, and time. He appears well-developed and well-nourished. No distress.  HENT:  Mouth/Throat: Oropharynx is clear and moist. No oropharyngeal exudate.  Cardiovascular: Normal rate, regular rhythm and normal heart sounds. Exam reveals no gallop and no friction rub.  No murmur heard.  Pulmonary/Chest: Effort normal and breath sounds normal. No respiratory distress. He has no wheezes.  Abdominal: Soft. Bowel sounds are normal. He exhibits no distension. There is no tenderness.  Psychiatric: He has a normal mood and affect. His behavior is normal.    CBC Lab Results  Component Value Date   WBC 6.8 06/16/2019   RBC 3.91 (L) 06/16/2019   HGB 12.0 (L) 06/16/2019   HCT 36.7 (L) 06/16/2019   PLT 154.0 06/16/2019   MCV 93.8 06/16/2019   MCH 30.4 05/19/2016   MCHC 32.6 06/16/2019   RDW 15.4 06/16/2019   LYMPHSABS 1.2 06/16/2019   MONOABS 0.6 06/16/2019   EOSABS 0.2 06/16/2019    BMET Lab Results  Component Value Date   NA 136 06/29/2019   K  4.2 06/29/2019   CL 108 06/29/2019   CO2 19 06/29/2019   GLUCOSE 105 (H) 06/29/2019   BUN 38 (H) 06/29/2019   CREATININE 1.80 (H) 06/29/2019   CALCIUM 8.7 06/29/2019   GFRNONAA 33 (L) 05/19/2016   GFRAA 38 (L) 05/19/2016      Assessment and Plan  Hx of cdifficile = appears not his primary issue since being treated for lymphocytic colitis. Appears improved from cdifficile standpoint  Recommend to continue on budesonide by dr Loletha Carrow to see if further improvement. Will follow up with patient in another 4-6 wk on televisit.

## 2019-08-18 ENCOUNTER — Other Ambulatory Visit: Payer: Self-pay | Admitting: Adult Health

## 2019-08-18 NOTE — Telephone Encounter (Signed)
Sent to the pharmacy by e-scribe. 

## 2019-08-25 ENCOUNTER — Other Ambulatory Visit: Payer: Self-pay | Admitting: Gastroenterology

## 2019-09-18 ENCOUNTER — Other Ambulatory Visit: Payer: Self-pay | Admitting: Gastroenterology

## 2019-09-21 ENCOUNTER — Ambulatory Visit (INDEPENDENT_AMBULATORY_CARE_PROVIDER_SITE_OTHER): Payer: Medicare HMO | Admitting: Internal Medicine

## 2019-09-21 ENCOUNTER — Other Ambulatory Visit: Payer: Self-pay

## 2019-09-21 DIAGNOSIS — A0471 Enterocolitis due to Clostridium difficile, recurrent: Secondary | ICD-10-CM

## 2019-09-21 DIAGNOSIS — K52832 Lymphocytic colitis: Secondary | ICD-10-CM

## 2019-09-21 NOTE — Progress Notes (Signed)
RFV: follow up for recurrent cdifficile-televisit, identified by 2 data identifiers of name and dob   Patient ID: Shane [name], male   DOB: 02-Feb-1932, 85 y.o.   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 Had felt slightly better. Having some formed stools occasionally but not consistently. He had infrequent liquid bowel movement/diarrhea. Denies abdominal cramping. No blood in stool.  Has good appetite. Had gained 5# in the first week, but now stablized.   Has not been exposed with covid. Has a golfmate, who he has not seen in a few months, who has it this past week.  Outpatient Encounter Medications as of 09/21/2019  Medication Sig  . acetaminophen (TYLENOL) 500 MG tablet Take 500-1,000 mg by mouth every 6 (six) hours as needed (for pain).  Marland Kitchen allopurinol (ZYLOPRIM) 300 MG tablet TAKE 1 TABLET BY MOUTH EVERY DAY  . atenolol (TENORMIN) 25 MG tablet TAKE 1 TABLET BY MOUTH EVERY DAY  . budesonide (ENTOCORT EC) 3 MG 24 hr capsule TAKE 3 CAPSULES (9 MG TOTAL) BY MOUTH DAILY.  . chlorthalidone (HYGROTON) 25 MG tablet TAKE 1/2 TABLET BY MOUTH EVERY DAY  . dutasteride (AVODART) 0.5 MG capsule TAKE 1 CAPSULE BY MOUTH EVERY DAY  . ibuprofen (ADVIL,MOTRIN) 200 MG tablet Take 200-600 mg by mouth every 6 (six) hours as needed (for pain).  . Multiple Vitamins-Minerals (PRESERVISION AREDS 2) CAPS Take 1 capsule by mouth 2 (two) times daily.  Marland Kitchen omeprazole (PRILOSEC) 40 MG capsule TAKE 1 CAPSULE BY MOUTH EVERY DAY   No facility-administered encounter medications on file as of 09/21/2019.     Patient Active Problem List   Diagnosis Date Noted  . Chronic kidney disease (CKD), stage III (moderate) 06/16/2019  . Clostridium difficile infection   . Hypertriglyceridemia 12/01/2015  . EXTRINSIC ASTHMA, UNSPECIFIED 01/11/2009  . SPINAL STENOSIS 05/24/2008  . PROSTATE SPECIFIC ANTIGEN, ELEVATED 05/24/2008  . OBESITY 08/13/2006    . ANEMIA-IRON DEFICIENCY 08/13/2006  . Essential hypertension 08/13/2006  . ALLERGIC RHINITIS 08/13/2006  . GERD 08/13/2006  . HIATAL HERNIA 08/13/2006  . DEGENERATIVE JOINT DISEASE 08/13/2006   Sochx: no smoking no etoh  There are no preventive care reminders to display for this patient.   Review of Systems 12 point ros is otherwise negative ,except what is mentioned above Physical Exam   Did not have opportunity for physical exam CBC Lab Results  Component Value Date   WBC 6.8 06/16/2019   RBC 3.91 (L) 06/16/2019   HGB 12.0 (L) 06/16/2019   HCT 36.7 (L) 06/16/2019   PLT 154.0 06/16/2019   MCV 93.8 06/16/2019   MCH 30.4 05/19/2016   MCHC 32.6 06/16/2019   RDW 15.4 06/16/2019   LYMPHSABS 1.2 06/16/2019   MONOABS 0.6 06/16/2019   EOSABS 0.2 06/16/2019    BMET Lab Results  Component Value Date   NA 136 06/29/2019   K 4.2 06/29/2019   CL 108 06/29/2019   CO2 19 06/29/2019   GLUCOSE 105 (H) 06/29/2019   BUN 38 (H) 06/29/2019   CREATININE 1.80 (H) 06/29/2019   CALCIUM 8.7 06/29/2019   GFRNONAA 33 (L) 05/19/2016   GFRAA 38 (L) 05/19/2016    Assessment and Plan  Recurrent  cdifficile = appears resolved  Possible lymphocytic colitis = having some response to budesonide. Has roughly 13 days left of treatment. Recommended to follow back up with dr Loletha Carrow for next course of treatment vs. Observations.  rtc as  needed

## 2019-09-22 ENCOUNTER — Telehealth: Payer: Self-pay | Admitting: Gastroenterology

## 2019-09-22 NOTE — Telephone Encounter (Signed)
Pt states he saw Dr. Baxter Flattery and she feels he is over c diff. He reports he is taking budesonide 9mg  daily and he is having 2-3 semi-formed stools/day. Pt wants to know if he needs to continue taking the budesonide and if so he needs a refill on the script. Please advise.

## 2019-09-22 NOTE — Telephone Encounter (Signed)
Glad to hear he is doing well. Please have him stop the budesonide when the current prescription runs out, and let us know if diarrhea worsens afterwards.

## 2019-09-22 NOTE — Telephone Encounter (Signed)
Pt aware.

## 2019-10-11 MED ORDER — BUDESONIDE 3 MG PO CPEP: 9.0000 mg | ORAL_CAPSULE | Freq: Every day | ORAL | 3 refills | Status: DC

## 2019-10-11 NOTE — Telephone Encounter (Addendum)
Since Dr. Baxter Flattery believes his C. 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  It seems he needs to remain on the budesonide long-term for some control of the symptoms.  Resume budesonide (Entocort) 9 mg once daily.  I have sent a new prescription.  He can also use Imodium in addition to that to help control the diarrhea.

## 2019-10-11 NOTE — Telephone Encounter (Signed)
Dr. Loletha Carrow,   I spoke with the patient who stated he finished his Budesonide prescription 10/05/19. He reports while on the Budesonide he never had a solid stool, only semi-solid. He states on 10/08/19 the loose stools returned. 10/09/19 he described his bowel movements as "clear liquid." He reports up to 4 loose/liquid stools daily. He wants to know next steps. Please advise.

## 2019-10-11 NOTE — Telephone Encounter (Signed)
Patient made aware to retreive new prescription from the pharmacy. Patient also aware that he can use the Imodium in addition to the Budesonide to help control the diarrhea.

## 2019-10-11 NOTE — Telephone Encounter (Signed)
Pt stated that he has finished budesonide and that his diarrhea has returned.

## 2019-10-11 NOTE — Addendum Note (Signed)
Addended by: Nelida Meuse on: 10/11/2019 01:44 PM   Modules accepted: Orders

## 2019-11-11 DIAGNOSIS — R69 Illness, unspecified: Secondary | ICD-10-CM | POA: Diagnosis not present

## 2019-11-13 ENCOUNTER — Ambulatory Visit: Payer: Medicare HMO

## 2019-11-14 ENCOUNTER — Ambulatory Visit: Payer: Medicare HMO

## 2019-12-14 DIAGNOSIS — H35372 Puckering of macula, left eye: Secondary | ICD-10-CM | POA: Diagnosis not present

## 2019-12-14 DIAGNOSIS — H353123 Nonexudative age-related macular degeneration, left eye, advanced atrophic without subfoveal involvement: Secondary | ICD-10-CM | POA: Diagnosis not present

## 2019-12-14 DIAGNOSIS — H353114 Nonexudative age-related macular degeneration, right eye, advanced atrophic with subfoveal involvement: Secondary | ICD-10-CM | POA: Diagnosis not present

## 2019-12-14 DIAGNOSIS — H353212 Exudative age-related macular degeneration, right eye, with inactive choroidal neovascularization: Secondary | ICD-10-CM | POA: Diagnosis not present

## 2020-01-31 ENCOUNTER — Other Ambulatory Visit: Payer: Self-pay | Admitting: Gastroenterology

## 2020-02-01 ENCOUNTER — Telehealth: Payer: Self-pay | Admitting: Gastroenterology

## 2020-02-01 NOTE — Telephone Encounter (Signed)
Patient states Dr. Loletha Carrow wants patient to call in every 3 months with an update- patient states that he is doing fine on Budesonide and thinks that it is working. States he has gained about 15-20 pounds taking medication. He is asking if Dr. Loletha Carrow wants him to continue taking the medication or taper off- states if he needs to make OV to see Dr. Loletha Carrow he can.

## 2020-02-01 NOTE — Telephone Encounter (Signed)
Spoke with pts wife and she is aware. Pt scheduled to see Dr. Loletha Carrow 03/09/20@9 :20am. She is aware of appt.

## 2020-02-01 NOTE — Telephone Encounter (Signed)
Glad to hear he is doing well, but I would like to try a taper of the medication to see if we can maintain good symptom control and hopefully help him lose some weight. Because of the way this medicine is metabolized, does not typically have the same potential for steroid related side effects as prednisone would.  Continue efforts at portion control and activity level to help control weight.  Decrease budesonide to 6 mg (2 capsules) once daily.  In 3 weeks, decrease to 3 mg (1 tablet) daily  Please see me in clinic in 5 to 6 weeks.  Call sooner if symptoms seem to worsen with this taper.  - HD

## 2020-02-15 ENCOUNTER — Ambulatory Visit (HOSPITAL_COMMUNITY)
Admission: EM | Admit: 2020-02-15 | Discharge: 2020-02-15 | Disposition: A | Discharge status: Home / Self Care | Payer: Medicare HMO | Attending: Physician Assistant | Admitting: Physician Assistant

## 2020-02-15 ENCOUNTER — Encounter (HOSPITAL_COMMUNITY): Payer: Self-pay

## 2020-02-15 ENCOUNTER — Other Ambulatory Visit: Payer: Self-pay

## 2020-02-15 DIAGNOSIS — W19XXXA Unspecified fall, initial encounter: Secondary | ICD-10-CM | POA: Diagnosis not present

## 2020-02-15 DIAGNOSIS — S51011A Laceration without foreign body of right elbow, initial encounter: Secondary | ICD-10-CM

## 2020-02-15 NOTE — Discharge Instructions (Signed)
Change the dressings daily. Apply topical anti-biotics   Wash with soap and water daily  If severe pain, spreading redness or discharge, please return  Follow up with primary care in 1 week for wound check

## 2020-02-15 NOTE — ED Triage Notes (Signed)
Pt states he tripped and fell on his right arm and now has a few skin tear on arm.

## 2020-02-15 NOTE — ED Provider Notes (Signed)
[place]    CSN: [id number] Arrival date & time: 02/15/20  1701      History   Chief Complaint Chief Complaint  Patient presents with  . Arm Injury    HPI Shane [name] is a 86 y.o. male.   Patient reports for evaluation of cuts on his right elbow and forearm.  He reports he was working in his yard earlier when he tripped and fell over the back of a lawnmower landing on his right elbow in the grass.  Reports having some skin tears from this.  Denies elbow pain, shoulder or wrist pain.  Denies hitting his head.  Remembers the entire ordeal.  Denies any loss of consciousness.  Denies getting dizzy or lightheaded when this occurred.  Denies headache.  Denies other injuries.     Past Medical History:  Diagnosis Date  . Allergy   . Anemia, iron deficiency   . C. difficile diarrhea   . 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 Cataract    bil cataracts removed  . DDD (degenerative disc disease), cervical   . Dysplastic nevus of left upper extremity   . Elevated PSA   . Essential hypertension   . Extrinsic asthma   . GERD (gastroesophageal reflux disease)    no recent  . Gout   . Hiatal hernia   . Macular degeneration   . Neuromuscular disorder ([place])    hiatal hernia  . Pneumonia    2014ish  . Spinal stenosis     Patient Active Problem List   Diagnosis Date Noted  . Chronic kidney disease (CKD), stage III (moderate) 06/16/2019  . Clostridium difficile infection   . Hypertriglyceridemia 12/01/2015  . EXTRINSIC ASTHMA, UNSPECIFIED 01/11/2009  . SPINAL STENOSIS 05/24/2008  . PROSTATE SPECIFIC ANTIGEN, ELEVATED 05/24/2008  . OBESITY 08/13/2006  . ANEMIA-IRON DEFICIENCY 08/13/2006  . Essential hypertension 08/13/2006  . ALLERGIC RHINITIS 08/13/2006  . GERD 08/13/2006  . HIATAL HERNIA 08/13/2006  . DEGENERATIVE JOINT DISEASE 08/13/2006    Past Surgical History:  Procedure Laterality Date  . BACK SURGERY    . Bone Spur Right    Shoulder  .  COLONOSCOPY    . COLONOSCOPY W/ POLYPECTOMY    . COLONOSCOPY WITH PROPOFOL N/A 11/19/2016   Procedure: COLONOSCOPY WITH PROPOFOL;  Surgeon: Doran Stabler, MD;  Location: [place];  Service: Gastroenterology;  Laterality: N/A;  . FECAL TRANSPLANT N/A 11/19/2016   Procedure: FECAL TRANSPLANT;  Surgeon: Doran Stabler, MD;  Location: [place];  Service: Gastroenterology;  Laterality: N/A;  . MOUTH SURGERY     2 teeth removed resulting in a dry socket in one of them  . POLYPECTOMY    . SKIN CANCER EXCISION         Home Medications    Prior to Admission medications   Medication Sig Start Date End Date Taking? Authorizing Provider  acetaminophen (TYLENOL) 500 MG tablet Take 500-1,000 mg by mouth every 6 (six) hours as needed (for pain).    [provider]  allopurinol (ZYLOPRIM) 300 MG tablet TAKE 1 TABLET BY MOUTH EVERY DAY 07/20/19   Nafziger, Tommi Rumps, NP  atenolol (TENORMIN) 25 MG tablet TAKE 1 TABLET BY MOUTH EVERY DAY 07/30/19   Nafziger, Tommi Rumps, NP  budesonide (ENTOCORT EC) 3 MG 24 hr capsule TAKE 3 CAPSULES BY MOUTH EVERY DAY 01/31/20   Doran Stabler, MD  chlorthalidone (HYGROTON) 25 MG tablet TAKE 1/2 TABLET BY MOUTH EVERY DAY  07/30/19   Nafziger, Tommi Rumps, NP  dutasteride (AVODART) 0.5 MG capsule TAKE 1 CAPSULE BY MOUTH EVERY DAY 08/18/19   Nafziger, Tommi Rumps, NP  ibuprofen (ADVIL,MOTRIN) 200 MG tablet Take 200-600 mg by mouth every 6 (six) hours as needed (for pain).    [provider]  Multiple Vitamins-Minerals (PRESERVISION AREDS 2) CAPS Take 1 capsule by mouth 2 (two) times daily.    [provider]  omeprazole (PRILOSEC) 40 MG capsule TAKE 1 CAPSULE BY MOUTH EVERY DAY 07/26/19   Doran Stabler, MD    Family History Family History  Problem Relation Age of Onset  . Multiple myeloma Brother   . Prostate cancer Brother   . Hypertension Father        ?  . Lung cancer Sister   . Colon cancer Neg Hx   . Stomach cancer Neg Hx   . Esophageal  cancer Neg Hx   . Rectal cancer Neg Hx     Social History Social History   Tobacco Use  . Smoking status: Former Smoker    Years: 35.00    Types: Cigarettes  . Smokeless tobacco: Never Used  . Tobacco comment: quit late 1980's  Substance Use Topics  . Alcohol use: No  . Drug use: No     Allergies   Patient has no known allergies.   Review of Systems Review of Systems   Physical Exam Triage Vital Signs ED Triage Vitals  Enc Vitals Group     BP 02/15/20 1734 (!) 175/100     Pulse Rate 02/15/20 1734 97     Resp 02/15/20 1734 16     Temp 02/15/20 1734 98 F (36.7 C)     Temp Source 02/15/20 1734 Oral     SpO2 02/15/20 1734 95 %     Weight 02/15/20 1735 193 lb (87.5 kg)     Height 02/15/20 1735 '5\' 9"'  (1.753 m)     Head Circumference --      Peak Flow --      Pain Score 02/15/20 1735 5     Pain Loc --      Pain Edu? --      Excl. in [place]? --    No data found.  Updated Vital Signs BP (!) 175/100   Pulse 97   Temp 98 F (36.7 C) (Oral)   Resp 16   Ht '5\' 9"'  (1.753 m)   Wt 193 lb (87.5 kg)   SpO2 95%   BMI 28.50 kg/m   Visual Acuity Right Eye Distance:   Left Eye Distance:   Bilateral Distance:    Right Eye Near:   Left Eye Near:    Bilateral Near:     Physical Exam Vitals and nursing note reviewed.  Constitutional:      Appearance: He is well-developed. He is not ill-appearing.  HENT:     Head: Normocephalic and atraumatic.  Eyes:     Conjunctiva/sclera: Conjunctivae normal.  Cardiovascular:     Rate and Rhythm: Normal rate.  Pulmonary:     Effort: Pulmonary effort is normal. No respiratory distress.  Musculoskeletal:     Cervical back: Neck supple.  Skin:    General: Skin is warm and dry.     Comments: Several superficial skin tears along the lateral aspect of the right forearm and elbow.  Skin flaps are intact.  Skin is very thin.  There is some diminishing bruising along the lateral aspect of the elbow the patient states has  been there  for some time.  Neurological:     Mental Status: He is alert.      UC Treatments / Results  Labs (all labs ordered are listed, but only abnormal results are displayed) Labs Reviewed - No data to display  EKG   Radiology No results found.  Procedures Laceration Repair  Date/Time: 02/15/2020 7:12 PM Performed by: Purnell Shoemaker, PA-C Authorized by: Purnell Shoemaker, PA-C   Consent:    Consent obtained:  Verbal   Consent given by:  Patient   Risks discussed:  Infection, pain and poor wound healing   Alternatives discussed:  No treatment Anesthesia (see MAR for exact dosages):    Anesthesia method:  None Laceration details:    Location: Skin tears to right forearm and elbow. Treatment:    Area cleansed with:  Saline   Amount of cleaning:  Standard   Irrigation solution:  Tap water and sterile saline   Irrigation volume:  Copious   Irrigation method:  Syringe   Visualized foreign bodies/material removed: yes (Grass and dirt debris removed)   Skin repair:    Repair method:  Steri-Strips   Number of Steri-Strips:  4 Approximation:    Approximation:  Loose Post-procedure details:    Dressing:  Antibiotic ointment and non-adherent dressing   Patient tolerance of procedure:  Tolerated well, no immediate complications Comments:     Skin flaps held closed with Steri-Strips loosely.  Anabiotic ointment and bandaging applied.   (including critical care time)   Medications Ordered in UC Medications - No data to display  Initial Impression / Assessment and Plan / UC Course  I have reviewed the triage vital signs and the nursing notes.  Pertinent labs & imaging results that were available during my care of the patient were reviewed by me and considered in my medical decision making (see chart for details).     #Fall #Skin tear right elbow Patient is 86 year old skin tears after a fall earlier.  No other trauma.  Neurologically well and provoked fall.  Wounds cleaned and  bandaged.  Instructed to do daily bandage changes and cleaning.  Recommended topical antibiotics.  Recommended to follow-up with his primary care in about 1 week for wound recheck.  Signs of infection were discussed.  Patient verbalized understanding. Final Clinical Impressions(s) / UC Diagnoses   Final diagnoses:  Fall, initial encounter  Skin tear of right elbow without complication, initial encounter     Discharge Instructions     Change the dressings daily. Apply topical anti-biotics   Wash with soap and water daily  If severe pain, spreading redness or discharge, please return  Follow up with primary care in 1 week for wound check     ED Prescriptions    None     PDMP not reviewed this encounter.   Purnell Shoemaker, PA-C 02/15/20 1914

## 2020-02-21 ENCOUNTER — Other Ambulatory Visit: Payer: Self-pay

## 2020-02-22 ENCOUNTER — Encounter: Payer: Self-pay | Admitting: Adult Health

## 2020-02-22 ENCOUNTER — Ambulatory Visit (INDEPENDENT_AMBULATORY_CARE_PROVIDER_SITE_OTHER): Payer: Medicare HMO | Admitting: Adult Health

## 2020-02-22 ENCOUNTER — Other Ambulatory Visit: Payer: Self-pay

## 2020-02-22 VITALS — BP 152/90 | Temp 98.2°F | Wt 195.0 lb

## 2020-02-22 DIAGNOSIS — I10 Essential (primary) hypertension: Secondary | ICD-10-CM

## 2020-02-22 DIAGNOSIS — Z5189 Encounter for other specified aftercare: Secondary | ICD-10-CM | POA: Diagnosis not present

## 2020-02-22 MED ORDER — LISINOPRIL 5 MG PO TABS: 5.0000 mg | ORAL_TABLET | Freq: Every day | ORAL | 0 refills | Status: DC

## 2020-02-22 NOTE — Progress Notes (Signed)
Subjective:    Patient ID: Shane [name], male    DOB: 1932-09-24, 86 y.o.   MRN: [id number]  HPI 86 year old male who  has a past medical history of Allergy, Anemia, iron deficiency, C. difficile diarrhea, Cancer ([place]), Cataract, DDD (degenerative disc disease), cervical, Dysplastic nevus of left upper extremity, Elevated PSA, Essential hypertension, Extrinsic asthma, GERD (gastroesophageal reflux disease), Gout, Hiatal hernia, Macular degeneration, Neuromuscular disorder ([place]), Pneumonia, and Spinal stenosis.  He presents to the office today for follow up after recent UC visit on 02/15/2020. He presents to the ER for evaluation of cuts on his right elbow and forearm. He was working in his yard earlier and tripped and fell over the back of a lawnmower, landing on his right elbow in the grass. He denies LOC, getting dizzy, or lightheaded when this fall occurred.  He has been applying a topical antibiotic and keeping the area wrapped with a bandage.  He denies any signs or symptoms of infection.  Additionally, his blood pressure was elevated at the urgent care and when he has checked it at home he is getting readings between 150 and 170 over 90s to 100s.  He denies signs or symptoms.  Is currently prescribed atenolol 25 mg and chlorthalidone 12.5 mg.  BP Readings from Last 3 Encounters:  02/22/20 (!) 152/90  02/15/20 (!) 175/100  08/02/19 138/80      Review of Systems See HPI   Past Medical History:  Diagnosis Date  . Allergy   . Anemia, iron deficiency   . C. difficile diarrhea   . 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 Cataract    bil cataracts removed  . DDD (degenerative disc disease), cervical   . Dysplastic nevus of left upper extremity   . Elevated PSA   . Essential hypertension   . Extrinsic asthma   . GERD (gastroesophageal reflux disease)    no recent  . Gout   . Hiatal hernia   . Macular degeneration   . Neuromuscular disorder ([place])    hiatal hernia  . Pneumonia     2014ish  . Spinal stenosis     Social History   Socioeconomic History  . Marital status: Married    Spouse name: Not on file  . Number of children: 3  . Years of education: Not on file  . Highest education level: Not on file  Occupational History  . Occupation: retired  Tobacco Use  . Smoking status: Former Smoker    Years: 35.00    Types: Cigarettes  . Smokeless tobacco: Never Used  . Tobacco comment: quit late 1980's  Substance and Sexual Activity  . Alcohol use: No  . Drug use: No  . Sexual activity: Not on file  Other Topics Concern  . Not on file  Social History Narrative   Retired from Psychologist, educational    Married for 55 years    Three children all live locally    [place] to play golf and go to [employer].    Social Determinants of Health   Financial Resource Strain:   . Difficulty of Paying Living Expenses:   Food Insecurity:   . Worried About Charity fundraiser in the Last Year:   . Arboriculturist in the Last Year:   Transportation Needs:   . Film/video editor (Medical):   Marland Kitchen Lack of Transportation (Non-Medical):   Physical Activity:   . Days of Exercise per Week:   .  Minutes of Exercise per Session:   Stress:   . Feeling of Stress :   Social Connections:   . Frequency of Communication with Friends and Family:   . Frequency of Social Gatherings with Friends and Family:   . Attends Religious Services:   . Active Member of Clubs or Organizations:   . Attends Archivist Meetings:   Marland Kitchen Marital Status:   Intimate Partner Violence:   . Fear of Current or Ex-Partner:   . Emotionally Abused:   Marland Kitchen Physically Abused:   . Sexually Abused:     Past Surgical History:  Procedure Laterality Date  . BACK SURGERY    . Bone Spur Right    Shoulder  . COLONOSCOPY    . COLONOSCOPY W/ POLYPECTOMY    . COLONOSCOPY WITH PROPOFOL N/A 11/19/2016   Procedure: COLONOSCOPY WITH PROPOFOL;  Surgeon: Doran Stabler, MD;  Location: [facility];  Service:  Gastroenterology;  Laterality: N/A;  . FECAL TRANSPLANT N/A 11/19/2016   Procedure: FECAL TRANSPLANT;  Surgeon: Doran Stabler, MD;  Location: [facility];  Service: Gastroenterology;  Laterality: N/A;  . MOUTH SURGERY     2 teeth removed resulting in a dry socket in one of them  . POLYPECTOMY    . SKIN CANCER EXCISION      Family History  Problem Relation Age of Onset  . Multiple myeloma Brother   . Prostate cancer Brother   . Hypertension Father        ?  . Lung cancer Sister   . Colon cancer Neg Hx   . Stomach cancer Neg Hx   . Esophageal cancer Neg Hx   . Rectal cancer Neg Hx     No Known Allergies  Current Outpatient Medications on File Prior to Visit  Medication Sig Dispense Refill  . acetaminophen (TYLENOL) 500 MG tablet Take 500-1,000 mg by mouth every 6 (six) hours as needed (for pain).    Marland Kitchen allopurinol (ZYLOPRIM) 300 MG tablet TAKE 1 TABLET BY MOUTH EVERY DAY 90 tablet 3  . atenolol (TENORMIN) 25 MG tablet TAKE 1 TABLET BY MOUTH EVERY DAY 90 tablet 3  . budesonide (ENTOCORT EC) 3 MG 24 hr capsule TAKE 3 CAPSULES BY MOUTH EVERY DAY 270 capsule 1  . chlorthalidone (HYGROTON) 25 MG tablet TAKE 1/2 TABLET BY MOUTH EVERY DAY 45 tablet 3  . dutasteride (AVODART) 0.5 MG capsule TAKE 1 CAPSULE BY MOUTH EVERY DAY 90 capsule 3  . ibuprofen (ADVIL,MOTRIN) 200 MG tablet Take 200-600 mg by mouth every 6 (six) hours as needed (for pain).    . Multiple Vitamins-Minerals (PRESERVISION AREDS 2) CAPS Take 1 capsule by mouth 2 (two) times daily.    Marland Kitchen omeprazole (PRILOSEC) 40 MG capsule TAKE 1 CAPSULE BY MOUTH EVERY DAY 90 capsule 3   No current facility-administered medications on file prior to visit.    BP (!) 152/90   Temp 98.2 F (36.8 C)   Wt 195 lb (88.5 kg)   BMI 28.80 kg/m       Objective:   Physical Exam Vitals and nursing note reviewed.  Constitutional:      Appearance: Normal appearance.  Cardiovascular:     Rate and Rhythm: Normal rate and regular rhythm.      Pulses: Normal pulses.     Heart sounds: Normal heart sounds.  Pulmonary:     Effort: Pulmonary effort is normal.     Breath sounds: Normal breath sounds.  Skin:  General: Skin is warm and dry.     Comments: Is skin tears on his right arm appear to be nearly healed.  No signs of infection noted   Neurological:     General: No focal deficit present.     Mental Status: He is alert and oriented to person, place, and time.  Psychiatric:        Mood and Affect: Mood normal.        Behavior: Behavior normal.       Assessment & Plan:  1. Encounter for wound care He is okay to refrain from bandaging at this time.  Continue to keep clean with soap and water.  Follow-up as needed 2. Essential hypertension - Not at goal.  - Will add lisinopril 5 mg to regimen.  Courage to continue to monitor blood pressures at home and bring his log with him to his visit in 2 weeks for follow-up - lisinopril (ZESTRIL) 5 MG tablet; Take 1 tablet (5 mg total) by mouth daily.  Dispense: 30 tablet; Refill: 0  Dorothyann Peng, NP

## 2020-02-22 NOTE — Patient Instructions (Addendum)
It was great seeing you today   I am going to add lisinopril 5 mg to your blood pressure medication regimen.   Please follow up in 2 weeks to see how your blood pressure is doing   Please check BP at home and bring your log with you

## 2020-03-06 ENCOUNTER — Other Ambulatory Visit: Payer: Self-pay

## 2020-03-07 ENCOUNTER — Encounter: Payer: Self-pay | Admitting: Adult Health

## 2020-03-07 ENCOUNTER — Ambulatory Visit (INDEPENDENT_AMBULATORY_CARE_PROVIDER_SITE_OTHER): Payer: Medicare HMO | Admitting: Adult Health

## 2020-03-07 VITALS — BP 144/88 | Temp 98.4°F | Wt 193.0 lb

## 2020-03-07 DIAGNOSIS — I10 Essential (primary) hypertension: Secondary | ICD-10-CM

## 2020-03-07 MED ORDER — LISINOPRIL 5 MG PO TABS: 5.0000 mg | ORAL_TABLET | Freq: Every day | ORAL | 3 refills | Status: DC

## 2020-03-07 NOTE — Patient Instructions (Addendum)
Your blood pressure is better. I am going to continue on the current dose of blood pressure medications   If you notice that your blood pressure is above 150/80 consistently then let me know

## 2020-03-07 NOTE — Progress Notes (Signed)
Subjective:    Patient ID: Shane [name], male    DOB: [birth year], 86 y.o.   MRN: [id number]  HPI 86 year old male who  has a past medical history of Allergy, Anemia, iron deficiency, C. difficile diarrhea, Cancer ([place]), Cataract, DDD (degenerative disc disease), cervical, Dysplastic nevus of left upper extremity, Elevated PSA, Essential hypertension, Extrinsic asthma, GERD (gastroesophageal reflux disease), Gout, Hiatal hernia, Macular degeneration, Neuromuscular disorder ([place]), Pneumonia, and Spinal stenosis.  He presents to the office today for follow-up regarding essential hypertension.  During his last visit on 02/22/2020 he reported that his blood pressure was elevated at the urgent care and he had been checking at home getting readings between 1 50-1 70 over 90s to 100s.  He denied symptoms.  He was currently prescribed atenolol and chlorthalidone and lisinopril 5 mg was added to his regimen.  Since starting Lisinopril he has noticed that his blood pressure is trending down into the 130-140/70-80's. He denies dizziness, lightheadedness, blurred vision, or dry cough.    Review of Systems See HPI   Past Medical History:  Diagnosis Date  . Allergy   . Anemia, iron deficiency   . C. difficile diarrhea   . 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 Cataract    bil cataracts removed  . DDD (degenerative disc disease), cervical   . Dysplastic nevus of left upper extremity   . Elevated PSA   . Essential hypertension   . Extrinsic asthma   . GERD (gastroesophageal reflux disease)    no recent  . Gout   . Hiatal hernia   . Macular degeneration   . Neuromuscular disorder ([place])    hiatal hernia  . Pneumonia    2014ish  . Spinal stenosis     Social History   Socioeconomic History  . Marital status: Married    Spouse name: Not on file  . Number of children: 3  . Years of education: Not on file  . Highest education level: Not on file  Occupational History  . Occupation: retired   Tobacco Use  . Smoking status: Former Smoker    Years: 35.00    Types: Cigarettes  . Smokeless tobacco: Never Used  . Tobacco comment: quit late 1980's  Substance and Sexual Activity  . Alcohol use: No  . Drug use: No  . Sexual activity: Not on file  Other Topics Concern  . Not on file  Social History Narrative   Retired from Psychologist, educational    Married for 55 years    Three children all live locally    [place] to play golf and go to [employer].    Social Determinants of Health   Financial Resource Strain:   . Difficulty of Paying Living Expenses:   Food Insecurity:   . Worried About Charity fundraiser in the Last Year:   . Arboriculturist in the Last Year:   Transportation Needs:   . Film/video editor (Medical):   Marland Kitchen Lack of Transportation (Non-Medical):   Physical Activity:   . Days of Exercise per Week:   . Minutes of Exercise per Session:   Stress:   . Feeling of Stress :   Social Connections:   . Frequency of Communication with Friends and Family:   . Frequency of Social Gatherings with Friends and Family:   . Attends Religious Services:   . Active Member of Clubs or Organizations:   . Attends Archivist Meetings:   .  Marital Status:   Intimate Partner Violence:   . Fear of Current or Ex-Partner:   . Emotionally Abused:   Marland Kitchen Physically Abused:   . Sexually Abused:     Past Surgical History:  Procedure Laterality Date  . BACK SURGERY    . Bone Spur Right    Shoulder  . COLONOSCOPY    . COLONOSCOPY W/ POLYPECTOMY    . COLONOSCOPY WITH PROPOFOL N/A 11/19/2016   Procedure: COLONOSCOPY WITH PROPOFOL;  Surgeon: Doran Stabler, MD;  Location: [facility];  Service: Gastroenterology;  Laterality: N/A;  . FECAL TRANSPLANT N/A 11/19/2016   Procedure: FECAL TRANSPLANT;  Surgeon: Doran Stabler, MD;  Location: [facility];  Service: Gastroenterology;  Laterality: N/A;  . MOUTH SURGERY     2 teeth removed resulting in a dry socket in one of them  .  POLYPECTOMY    . SKIN CANCER EXCISION      Family History  Problem Relation Age of Onset  . Multiple myeloma Brother   . Prostate cancer Brother   . Hypertension Father        ?  . Lung cancer Sister   . Colon cancer Neg Hx   . Stomach cancer Neg Hx   . Esophageal cancer Neg Hx   . Rectal cancer Neg Hx     No Known Allergies  Current Outpatient Medications on File Prior to Visit  Medication Sig Dispense Refill  . acetaminophen (TYLENOL) 500 MG tablet Take 500-1,000 mg by mouth every 6 (six) hours as needed (for pain).    Marland Kitchen allopurinol (ZYLOPRIM) 300 MG tablet TAKE 1 TABLET BY MOUTH EVERY DAY 90 tablet 3  . atenolol (TENORMIN) 25 MG tablet TAKE 1 TABLET BY MOUTH EVERY DAY 90 tablet 3  . budesonide (ENTOCORT EC) 3 MG 24 hr capsule TAKE 3 CAPSULES BY MOUTH EVERY DAY 270 capsule 1  . chlorthalidone (HYGROTON) 25 MG tablet TAKE 1/2 TABLET BY MOUTH EVERY DAY 45 tablet 3  . dutasteride (AVODART) 0.5 MG capsule TAKE 1 CAPSULE BY MOUTH EVERY DAY 90 capsule 3  . ibuprofen (ADVIL,MOTRIN) 200 MG tablet Take 200-600 mg by mouth every 6 (six) hours as needed (for pain).    . Multiple Vitamins-Minerals (PRESERVISION AREDS 2) CAPS Take 1 capsule by mouth 2 (two) times daily.    Marland Kitchen omeprazole (PRILOSEC) 40 MG capsule TAKE 1 CAPSULE BY MOUTH EVERY DAY 90 capsule 3   No current facility-administered medications on file prior to visit.    BP (!) 144/88 (BP Location: Right Arm)   Temp 98.4 F (36.9 C) (Temporal)   Wt 193 lb (87.5 kg)   BMI 28.50 kg/m       Objective:   Physical Exam Vitals and nursing note reviewed.  Constitutional:      Appearance: Normal appearance.  Cardiovascular:     Rate and Rhythm: Normal rate and regular rhythm.     Pulses: Normal pulses.     Heart sounds: Normal heart sounds.  Pulmonary:     Effort: Pulmonary effort is normal.     Breath sounds: Normal breath sounds.  Musculoskeletal:        General: Normal range of motion.  Skin:    General: Skin is warm  and dry.     Capillary Refill: Capillary refill takes less than 2 seconds.  Neurological:     General: No focal deficit present.     Mental Status: He is alert and oriented to person, place, and time.  Psychiatric:        Mood and Affect: Mood normal.        Thought Content: Thought content normal.        Judgment: Judgment normal.       Assessment & Plan:  1. Essential hypertension - BP better controlled.  - No change in medications at this time. Continue current meds - lisinopril (ZESTRIL) 5 MG tablet; Take 1 tablet (5 mg total) by mouth daily.  Dispense: 90 tablet; Refill: 3   Dorothyann Peng, NP

## 2020-03-09 ENCOUNTER — Encounter: Payer: Self-pay | Admitting: Gastroenterology

## 2020-03-09 ENCOUNTER — Ambulatory Visit: Payer: Medicare HMO | Admitting: Gastroenterology

## 2020-03-09 VITALS — BP 122/62 | HR 65 | Ht 69.0 in | Wt 193.0 lb

## 2020-03-09 DIAGNOSIS — K2100 Gastro-esophageal reflux disease with esophagitis, without bleeding: Secondary | ICD-10-CM

## 2020-03-09 DIAGNOSIS — K222 Esophageal obstruction: Secondary | ICD-10-CM | POA: Diagnosis not present

## 2020-03-09 DIAGNOSIS — K52832 Lymphocytic colitis: Secondary | ICD-10-CM | POA: Diagnosis not present

## 2020-03-09 DIAGNOSIS — K529 Noninfective gastroenteritis and colitis, unspecified: Secondary | ICD-10-CM

## 2020-03-09 NOTE — Progress Notes (Signed)
Shane [name] GI Progress Note  Chief Complaint: Chronic diarrhea/lymphocytic colitis  Subjective  History: Prior C diff, FMT 2017, recurred, periodic vanco.  ID eventually stopped it and did not feel persistnet C diff.  Good on budesonide, diarrhea recurs if stops it in the past.  Shane [name] has been slowly tapering off budesonide and now down to 3 mg a day, due to stop it in about 10 to 12 days.  His stool is formed most days, he has occasional semiformed to loose stool.  He has not noticed any change in that while tapering the medicine over the last few months.  He denies rectal bleeding or abdominal pain.  His appetite is good and weight stable.  Shane [name] had dysphagia from a severe reflux related stricture requiring balloon dilation November 2019.  That is when I started him on omeprazole 40 mg daily.  He has continued the medicine since then and has no return of heartburn or dysphagia.  ROS: Cardiovascular:  no chest pain Respiratory: no dyspnea Balance issues, had a fall last week. Arthralgias Remainder of systems negative except as above The patient's Past Medical, Family and Social History were reviewed and are on file in the EMR.  Objective:  Med list reviewed  Current Outpatient Medications:  .  acetaminophen (TYLENOL) 500 MG tablet, Take 500-1,000 mg by mouth every 6 (six) hours as needed (for pain)., Disp: , Rfl:  .  allopurinol (ZYLOPRIM) 300 MG tablet, TAKE 1 TABLET BY MOUTH EVERY DAY, Disp: 90 tablet, Rfl: 3 .  atenolol (TENORMIN) 25 MG tablet, TAKE 1 TABLET BY MOUTH EVERY DAY, Disp: 90 tablet, Rfl: 3 .  budesonide (ENTOCORT EC) 3 MG 24 hr capsule, TAKE 3 CAPSULES BY MOUTH EVERY DAY (Patient taking differently: 3 mg daily. ), Disp: 270 capsule, Rfl: 1 .  chlorthalidone (HYGROTON) 25 MG tablet, TAKE 1/2 TABLET BY MOUTH EVERY DAY, Disp: 45 tablet, Rfl: 3 .  dutasteride (AVODART) 0.5 MG capsule, TAKE 1 CAPSULE BY MOUTH EVERY DAY, Disp: 90 capsule, Rfl: 3 .  ibuprofen  (ADVIL,MOTRIN) 200 MG tablet, Take 200-600 mg by mouth every 6 (six) hours as needed (for pain)., Disp: , Rfl:  .  lisinopril (ZESTRIL) 5 MG tablet, Take 1 tablet (5 mg total) by mouth daily., Disp: 90 tablet, Rfl: 3 .  Multiple Vitamins-Minerals (PRESERVISION AREDS 2) CAPS, Take 1 capsule by mouth 2 (two) times daily., Disp: , Rfl:  .  omeprazole (PRILOSEC) 40 MG capsule, TAKE 1 CAPSULE BY MOUTH EVERY DAY, Disp: 90 capsule, Rfl: 3   Vital signs in last 24 hrs: Vitals:   03/09/20 0927  BP: 122/62  Pulse: 65    Physical Exam  Gait slow but steady, gets on exam table without assistance.  HEENT: sclera anicteric, oral mucosa moist without lesions  Neck: supple, no thyromegaly, JVD or lymphadenopathy  Cardiac: RRR without murmurs, S1S2 heard, no peripheral edema  Pulm: clear to auscultation bilaterally, normal RR and effort noted  Abdomen: soft, no tenderness, with active bowel sounds. No guarding or palpable hepatosplenomegaly.  Skin; warm and dry, no jaundice or rash  Labs:  Negative C. difficile testing December 2019, August 2020, September 2020 ___________________________________________ Radiologic studies:   ____________________________________________ Other:   _____________________________________________ Assessment & Plan  Assessment: Encounter Diagnoses  Name Primary?  . Lymphocytic colitis Yes  . Chronic diarrhea   . Gastroesophageal reflux disease with esophagitis without hemorrhage   . Esophageal stricture    Recurrent C. difficile colitis, responded to treatment courses of vancomycin.  Infectious disease feels confident that he no longer has active infection.  His lymphocytic colitis was originally diagnosed after a period of treatment and then a negative result.  However, shortly after that, with recurrent symptoms, his test became positive again, and this was prior to the FM T.  Nevertheless, I think he most likely still has persistent lymphocytic  colitis as a residual effect of the recurrent C. difficile infection that has now cleared.  He seems to be doing well after tapering the budesonide.  I would like him to be on the lowest effective dose or perhaps not at all if possible just to decrease the risk of long-term steroid related side effects, although of course this is decidedly a lower risk than prednisone given its metabolism.  He also has no current upper digestive symptoms such as heartburn or dysphagia. Plan: Decrease omeprazole at this dose to 3 times a week such as Monday Wednesday Friday.  Let me know if reflux symptoms or dysphagia recur  Finish the budesonide in 10 to 12 days as currently prescribed.  Contact us if he has recurrence of diarrhea, especially escalation of symptoms.   30 minutes were spent on this encounter (including chart review, history/exam, counseling/coordination of care, and documentation)  [name]

## 2020-03-09 NOTE — Patient Instructions (Addendum)
If you are age 84 or older, your body mass index should be between 23-30. Your Body mass index is 28.5 kg/m. If this is out of the aforementioned range listed, please consider follow up with your Primary Care Provider.  If you are age 17 or younger, your body mass index should be between 19-25. Your Body mass index is 28.5 kg/m. If this is out of the aformentioned range listed, please consider follow up with your Primary Care Provider.   Decrease your omeprazole to three times a week (I.e. mon,wed, fri)  Complete budesonide course and let us know if diarrhea recurs.  It was a pleasure to see you today!  Dr. Loletha Carrow

## 2020-03-20 ENCOUNTER — Telehealth: Payer: Self-pay | Admitting: Gastroenterology

## 2020-03-21 MED ORDER — BUDESONIDE 3 MG PO CPEP: ORAL_CAPSULE | ORAL | 3 refills | Status: DC

## 2020-03-21 NOTE — Telephone Encounter (Signed)
Pt states when he went down to 1 budesonide daily he started having diarrhea again. Reports he is now off if the med, he finished it Thursday or Friday last week and now reports he is having lots of diarrhea. Pt wants to know what Dr. Loletha Carrow recommends. Please advise.

## 2020-03-21 NOTE — Telephone Encounter (Signed)
Please have him resume to budesonide at 6 mg (2 tablets) once daily.  If that improves the diarrhea as expected, then continue that dosing for 3 months (around Labor Day)  and try to wean it again by going down to one daily.

## 2020-03-21 NOTE — Telephone Encounter (Signed)
Spoke with pt and he is aware. New script sent to pharmacy.

## 2020-05-03 NOTE — Telephone Encounter (Signed)
This has happened each time we try to wean down the medicine, so it seems he will have to continue it at the full dose of 3 capsules per day indefinitely.  I will refill at that dose when Rx needs renewal.  Call as needed.

## 2020-05-03 NOTE — Telephone Encounter (Signed)
See note below and advise. 

## 2020-05-03 NOTE — Telephone Encounter (Signed)
Pt called to update Dr. Loletha Carrow on his treatment. Pt reported that when he began taking budesonide 2 tablets/day sxs reoccurred so pt increased dose to 3 tablets/day again. He states that he has been taking that dose for about 3 weeks. He feels much better and sxs disappeared. He reported that his stools are mostly formed. Pt wants to know if he needs to wean it again or continue taking 3 tablets. Pls call him. Per pt, it is ok to leave a detailed message if he cannot answer his phone.

## 2020-05-03 NOTE — Telephone Encounter (Signed)
Detailed message left of pts identified voicemail.

## 2020-05-16 ENCOUNTER — Telehealth: Payer: Self-pay | Admitting: Gastroenterology

## 2020-05-16 ENCOUNTER — Other Ambulatory Visit: Payer: Self-pay

## 2020-05-16 MED ORDER — BUDESONIDE 3 MG PO CPEP: 9.0000 mg | ORAL_CAPSULE | Freq: Every day | ORAL | 1 refills | Status: DC

## 2020-05-17 NOTE — Telephone Encounter (Signed)
Rx was filled on 05-16-2020. Tried to call patient, no answer and no way to leave a voicemail.

## 2020-06-19 ENCOUNTER — Ambulatory Visit (INDEPENDENT_AMBULATORY_CARE_PROVIDER_SITE_OTHER): Payer: Medicare HMO | Admitting: Ophthalmology

## 2020-06-19 ENCOUNTER — Other Ambulatory Visit: Payer: Self-pay

## 2020-06-19 ENCOUNTER — Encounter (INDEPENDENT_AMBULATORY_CARE_PROVIDER_SITE_OTHER): Payer: Self-pay | Admitting: Ophthalmology

## 2020-06-19 DIAGNOSIS — H35372 Puckering of macula, left eye: Secondary | ICD-10-CM

## 2020-06-19 DIAGNOSIS — H353212 Exudative age-related macular degeneration, right eye, with inactive choroidal neovascularization: Secondary | ICD-10-CM

## 2020-06-19 DIAGNOSIS — H353124 Nonexudative age-related macular degeneration, left eye, advanced atrophic with subfoveal involvement: Secondary | ICD-10-CM | POA: Insufficient documentation

## 2020-06-19 DIAGNOSIS — H353114 Nonexudative age-related macular degeneration, right eye, advanced atrophic with subfoveal involvement: Secondary | ICD-10-CM | POA: Diagnosis not present

## 2020-06-19 DIAGNOSIS — H353123 Nonexudative age-related macular degeneration, left eye, advanced atrophic without subfoveal involvement: Secondary | ICD-10-CM | POA: Diagnosis not present

## 2020-06-19 NOTE — Assessment & Plan Note (Signed)
No signs of active disease at the border of the previous disciform scar

## 2020-06-19 NOTE — Progress Notes (Signed)
06/19/2020     CHIEF COMPLAINT Patient presents for Retina Follow Up   HISTORY OF PRESENT ILLNESS: Shane [name] is a 86 y.o. male who presents to the clinic today for:   HPI    Retina Follow Up    Patient presents with  Dry AMD.  In both eyes.  Severity is moderate.  Duration of 6 months.  Since onset it is stable.  I, the attending physician,  performed the HPI with the patient and updated documentation appropriately.          Comments    6 Month Dry AMD f\u OU. OCT  Pt states no changes in vision. Pt states he sees occasional floaters. Denies seeing FOL recently.       Last edited by Tilda Franco on 06/19/2020 10:59 AM. (History)      Referring physician: Dorothyann Peng, NP [place],  [place] [address]  HISTORICAL INFORMATION:   Selected notes from the [facility]: No current outpatient medications on file. (Ophthalmic Drugs)   No current facility-administered medications for this visit. (Ophthalmic Drugs)   Current Outpatient Medications (Other)  Medication Sig  . acetaminophen (TYLENOL) 500 MG tablet Take 500-1,000 mg by mouth every 6 (six) hours as needed (for pain).  Marland Kitchen allopurinol (ZYLOPRIM) 300 MG tablet TAKE 1 TABLET BY MOUTH EVERY DAY  . atenolol (TENORMIN) 25 MG tablet TAKE 1 TABLET BY MOUTH EVERY DAY  . budesonide (ENTOCORT EC) 3 MG 24 hr capsule Take 3 capsules (9 mg total) by mouth daily.  . chlorthalidone (HYGROTON) 25 MG tablet TAKE 1/2 TABLET BY MOUTH EVERY DAY  . dutasteride (AVODART) 0.5 MG capsule TAKE 1 CAPSULE BY MOUTH EVERY DAY  . ibuprofen (ADVIL,MOTRIN) 200 MG tablet Take 200-600 mg by mouth every 6 (six) hours as needed (for pain).  Marland Kitchen lisinopril (ZESTRIL) 5 MG tablet Take 1 tablet (5 mg total) by mouth daily.  . Multiple Vitamins-Minerals (PRESERVISION AREDS 2) CAPS Take 1 capsule by mouth 2 (two) times daily.  Marland Kitchen omeprazole (PRILOSEC) 40 MG capsule TAKE 1 CAPSULE BY MOUTH EVERY  DAY   No current facility-administered medications for this visit. (Other)      REVIEW OF SYSTEMS:    ALLERGIES No Known Allergies  PAST MEDICAL HISTORY Past Medical History:  Diagnosis Date  . Allergy   . Anemia, iron deficiency   . C. difficile diarrhea   . 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 Cataract    bil cataracts removed  . DDD (degenerative disc disease), cervical   . Dysplastic nevus of left upper extremity   . Elevated PSA   . Essential hypertension   . Extrinsic asthma   . GERD (gastroesophageal reflux disease)    no recent  . Gout   . Hiatal hernia   . Macular degeneration   . Neuromuscular disorder ([place])    hiatal hernia  . Pneumonia    2014ish  . Spinal stenosis    Past Surgical History:  Procedure Laterality Date  . BACK SURGERY    . Bone Spur Right    Shoulder  . COLONOSCOPY    . COLONOSCOPY W/ POLYPECTOMY    . COLONOSCOPY WITH PROPOFOL N/A 11/19/2016   Procedure: COLONOSCOPY WITH PROPOFOL;  Surgeon: Doran Stabler, MD;  Location: [facility];  Service: Gastroenterology;  Laterality: N/A;  . FECAL TRANSPLANT N/A 11/19/2016   Procedure: FECAL TRANSPLANT;  Surgeon:  Doran Stabler, MD;  [facility] ENDOSCOPY;  Service: Gastroenterology;  Laterality: N/A;  . MOUTH SURGERY     2 teeth removed resulting in a dry socket in one of them  . POLYPECTOMY    . SKIN CANCER EXCISION      FAMILY HISTORY Family History  Problem Relation Age of Onset  . Multiple myeloma Brother   . Prostate cancer Brother   . Hypertension Father        ?  . Lung cancer Sister   . Colon cancer Neg Hx   . Stomach cancer Neg Hx   . Esophageal cancer Neg Hx   . Rectal cancer Neg Hx     SOCIAL HISTORY Social History   Tobacco Use  . Smoking status: Former Smoker    Years: 35.00    Types: Cigarettes  . Smokeless tobacco: Never Used  . Tobacco comment: quit late 1980's  Vaping Use  . Vaping Use: Never used  Substance Use Topics  . Alcohol use: No  .  Drug use: No         OPHTHALMIC EXAM:  Base Eye Exam    Visual Acuity (Snellen - Linear)      Right Left   Dist cc CF @ 2' E Card @ 3'   Dist ph cc  20/100 +   Correction: Glasses       Tonometry (Tonopen, 11:05 AM)      Right Left   Pressure 14 13       Pupils      Pupils Dark Light Shape React APD   Right PERRL 6 6 Round Minimal None   Left PERRL 6 6 Round Minimal None       Visual Fields (Counting fingers)      Left Right    Full Full       Neuro/Psych    Oriented x3: Yes   Mood/Affect: Normal       Dilation    Both eyes: 1.0% Mydriacyl, 2.5% Phenylephrine @ 11:05 AM        Slit Lamp and Fundus Exam    External Exam      Right Left   External Normal Normal       Slit Lamp Exam      Right Left   Lids/Lashes Normal Normal   Conjunctiva/Sclera White and quiet White and quiet   Cornea Clear Clear   Anterior Chamber Deep and quiet Deep and quiet   Iris Round and reactive Round and reactive   Lens Centered posterior chamber intraocular lens Centered posterior chamber intraocular lens   Anterior Vitreous Normal Normal       Fundus Exam      Right Left   Posterior Vitreous Posterior vitreous detachment Posterior vitreous detachment   Disc Normal Normal   C/D Ratio 0.35 0.25   Macula Disciform scar 12 da size Geographic atrophy 1 da size   Vessels Normal Normal   Periphery Normal Normal          IMAGING AND PROCEDURES  Imaging and Procedures for 06/19/20  OCT, Retina - OU - Both Eyes       Right Eye Quality was good. Scan locations included subfoveal. Central Foveal Thickness: 467. Findings include abnormal foveal contour, cystoid macular edema, disciform scar.   Left Eye Quality was good. Scan locations included subfoveal. Central Foveal Thickness: 318. Progression has been stable. Findings include abnormal foveal contour, epiretinal membrane, outer retinal atrophy, central retinal atrophy, inner retinal  atrophy.                  ASSESSMENT/PLAN:  Exudative age-related macular degeneration of right eye with inactive choroidal neovascularization (HCC) No signs of active disease at the border of the previous disciform scar      ICD-10-CM   1. Advanced nonexudative age-related macular degeneration of left eye without subfoveal involvement  H35.3123 OCT, Retina - OU - Both Eyes  2. Left epiretinal membrane  H35.372   3. Advanced nonexudative age-related macular degeneration of right eye with subfoveal involvement  H35.3114   4. Exudative age-related macular degeneration of right eye with inactive choroidal neovascularization ([place])  H35.3212   5. Advanced nonexudative age-related macular degeneration of left eye with subfoveal involvement  H35.3124     1.  No change overall on the dry ARMD OU  2.  Epiretinal membrane also noted the left eye, nondistorted, not because of visual acuity decline  3.  Ophthalmic Meds Ordered this visit:  No orders of the defined types were placed in this encounter.      Return in about 6 months (around 12/17/2020) for DILATE OU, OCT.  Patient Instructions  To report any new onset visual acuity  declines or distortions    Explained the diagnoses, plan, and follow up with the patient and they expressed understanding.  Patient expressed understanding of the importance of proper follow up care.   Clent Demark Shaterrica Territo M.D. Diseases & Surgery of the Retina and Vitreous Retina [facility] Diabetic [place] 06/19/20     Abbreviations: M myopia (nearsighted); A astigmatism; H hyperopia (farsighted); P presbyopia; Mrx spectacle prescription;  CTL contact lenses; OD right eye; OS left eye; OU both eyes  XT exotropia; ET esotropia; PEK punctate epithelial keratitis; PEE punctate epithelial erosions; DES dry eye syndrome; MGD meibomian gland dysfunction; ATs artificial tears; PFAT's preservative free artificial tears; [place] nuclear sclerotic cataract; PSC posterior subcapsular cataract; ERM epi-retinal  membrane; PVD posterior vitreous detachment; RD retinal detachment; DM diabetes mellitus; DR diabetic retinopathy; NPDR non-proliferative diabetic retinopathy; PDR proliferative diabetic retinopathy; CSME clinically significant macular edema; DME diabetic macular edema; dbh dot blot hemorrhages; CWS cotton wool spot; POAG primary open angle glaucoma; C/D cup-to-disc ratio; HVF humphrey visual field; GVF goldmann visual field; OCT optical coherence tomography; IOP intraocular pressure; BRVO Branch retinal vein occlusion; CRVO central retinal vein occlusion; CRAO central retinal artery occlusion; BRAO branch retinal artery occlusion; RT retinal tear; SB scleral buckle; PPV pars plana vitrectomy; VH Vitreous hemorrhage; PRP panretinal laser photocoagulation; IVK intravitreal kenalog; VMT vitreomacular traction; MH Macular hole;  NVD neovascularization of the disc; NVE neovascularization elsewhere; AREDS age related eye disease study; ARMD age related macular degeneration; POAG primary open angle glaucoma; EBMD epithelial/anterior basement membrane dystrophy; ACIOL anterior chamber intraocular lens; IOL intraocular lens; PCIOL posterior chamber intraocular lens; Phaco/IOL phacoemulsification with intraocular lens placement; [place] photorefractive keratectomy; LASIK laser assisted in situ keratomileusis; HTN hypertension; DM diabetes mellitus; COPD chronic obstructive pulmonary disease

## 2020-06-19 NOTE — Patient Instructions (Signed)
To report any new onset visual acuity  declines or distortions

## 2020-06-20 ENCOUNTER — Telehealth: Payer: Self-pay | Admitting: Adult Health

## 2020-06-20 NOTE — Progress Notes (Signed)
  Chronic Care Management   Note  06/20/2020 Name: JEOVANNI HEURING MRN: [id number] DOB: 1932-03-30  DREAM NODAL is a 86 y.o. year old male who is a primary care patient of Dorothyann Peng, NP. I reached out to Bonnita Nasuti by phone today in response to a referral sent by Mr. Lyman PCP, Dorothyann Peng, NP.   Mr. Wiebelhaus was given information about Chronic Care Management services today including:  1. CCM service includes personalized support from designated clinical staff supervised by his physician, including individualized plan of care and coordination with other care providers 2. 24/7 contact phone numbers for assistance for urgent and routine care needs. 3. Service will only be billed when office clinical staff spend 20 minutes or more in a month to coordinate care. 4. Only one practitioner may furnish and bill the service in a calendar month. 5. The patient may stop CCM services at any time (effective at the end of the month) by phone call to the office staff.   Patient agreed to services and verbal consent obtained.   Follow up plan:   Carley Perdue [employer] Scheduler

## 2020-08-08 ENCOUNTER — Other Ambulatory Visit: Payer: Self-pay | Admitting: Adult Health

## 2020-08-08 DIAGNOSIS — E781 Pure hyperglyceridemia: Secondary | ICD-10-CM

## 2020-08-08 DIAGNOSIS — N183 Chronic kidney disease, stage 3 unspecified: Secondary | ICD-10-CM

## 2020-08-09 ENCOUNTER — Telehealth: Payer: Self-pay | Admitting: Pharmacist

## 2020-08-09 NOTE — Chronic Care Management (AMB) (Signed)
Chronic Care Management Pharmacy  Name: Shane [name]  MRN: [id number] DOB: 10-Shane-1933  Initial Planning Appointment: completed 08/09/20  Initial Questions: 1. Have you seen any other providers since your last visit? n/a 2. Any changes in your medicines or health? No   Chief Complaint/ HPI  Shane [name],  86 y.o. , male presents for their Initial CCM visit with the clinical pharmacist In office.  PCP : Dorothyann Peng, NP  Their chronic conditions include: HTN, gout, BPH, colitis, GERD/hiatal hernia, pain  Office Visits: - 03/07/20 Dorothyann Peng, NP: Patient presented for hypertension follow up. BP is better controlled. No change in medications at this time.   -02/22/20 Dorothyann Peng, NP: Patient presented for follow up from urgent care for wound care. Continue to keep cleaning the wound with soap and water. BP was elevated. Started lisinopril 5 mg daily and follow up in 2 weeks.  Consult Visit: -06/19/20 Deloria Lair, MD (ophthalmology): Patient presented for retina exam follow up.  Scan revealed no signs of active disease at the border of the previous disciform scar. Follow up in 6 months.   -03/09/20 Wilfrid Lund, MD (gastroenterology): Patient presented for follow up for colitis. Patient seems to be doing well after tapering budesonide. No upper digestive symptoms and plan to decrease omeprazole to 3 times a week.   -02/15/20 Patient presented to the ED for a fall and skin tear of his right elbow.   -09/21/19 Carlyle Basques, MD (infectious disease): Patient presented for follow up for recurrent C diff. Patient has had some response to budesonide. Follow up as needed.  Medications: Outpatient Encounter Medications as of 08/10/2020  Medication Sig   acetaminophen (TYLENOL) 500 MG tablet Take 500-1,000 mg by mouth every 6 (six) hours as needed (for pain).   allopurinol (ZYLOPRIM) 300 MG tablet TAKE 1 TABLET BY MOUTH EVERY DAY   atenolol (TENORMIN) 25 MG tablet TAKE 1 TABLET BY  MOUTH EVERY DAY   budesonide (ENTOCORT EC) 3 MG 24 hr capsule Take 3 capsules (9 mg total) by mouth daily.   chlorthalidone (HYGROTON) 25 MG tablet TAKE 1/2 TABLET BY MOUTH EVERY DAY   dutasteride (AVODART) 0.5 MG capsule TAKE 1 CAPSULE BY MOUTH EVERY DAY   ibuprofen (ADVIL,MOTRIN) 200 MG tablet Take 200-600 mg by mouth every 6 (six) hours as needed (for pain).   lisinopril (ZESTRIL) 5 MG tablet Take 1 tablet (5 mg total) by mouth daily.   Multiple Vitamins-Minerals (PRESERVISION AREDS 2) CAPS Take 1 capsule by mouth 2 (two) times daily.   omeprazole (PRILOSEC) 40 MG capsule TAKE 1 CAPSULE BY MOUTH EVERY DAY   No facility-administered encounter medications on file as of 08/10/2020.   Patient lives with his wife and they live in a 2 story house. Him and his wife recently went to [facility] for a week vacation. Patient reports most of his day consists of doing yardwork and he used to play golf a lot but hasn't lately. His wife wants them to move to [employer], a retirement community, as she is having trouble with the stairs and laundry.   Patient and his wife have a son in [place], a son in [place], who they haven't seen in 2 years due to [place], and a daughter in [place]. He has 5 grandchildren and the first one is going to get married this weekend.   Patient and his wife have someone come in every 2 weeks to help with cleaning and he does the laundry. His wife does  the cooking but he will make breakfast which consists of cereal or oatmeal. They eat out about 2-3 times a week. Patient eats meat, veggies and a salad for some meals. They don't eat steak often but sometimes have hamburgers, hot dogs or fish.  Patient does not follow a regular exercise program but does golf every once in a while.   Patient doesn't sleep well and specifically has problems with staying asleep. Patient reports that when he wakes up he cant get back to sleep but does go to the bathroom about 2-3 times a night. He  isnt sure if bathroom wakes him up or not and he does endorse napping during the day.  Patient denies any problems with his medicines other than he was wondering if any of them would make him feel very fatigued or bruise easily.     Current Diagnosis/Assessment:  Goals Addressed            This Visit's Progress    Pharmacy Care Plan       CARE PLAN ENTRY (see longitudinal plan of care for additional care plan information)  Current Barriers:   Chronic Disease Management support, education, and care coordination needs related to Hypertension, GERD, BPH, Gout, and colitis and pain   Hypertension BP Readings from Last 3 Encounters:  03/09/20 122/62  03/07/20 (!) 144/88  02/22/20 (!) 152/90    Pharmacist Clinical Goal(s): o Over the next 30 days, patient will work with PharmD and providers to maintain BP goal <140/90  Current regimen:   Atenolol 25 mg 1 tablet daily  Chlorthalidone 25 mg 1/2 tablet daily  Lisinopril 5 mg 1 tablet daily  Interventions: o Discussed DASH eating plan recommendations:  Emphasizes vegetables, fruits, and whole-grains  Includes fat-free or low-fat dairy products, fish, poultry, beans, nuts, and vegetable oils  Limits foods that are high in saturated fat. These foods include fatty meats, full-fat dairy products, and tropical oils such as coconut, palm kernel, and palm oils.  Limits sugar-sweetened beverages and sweets  Limiting sodium intake to < 1500 mg/day o Discussed recommendations for moderate aerobic exercise for 150 minutes/week spread out over 5 days for heart healthy lifestyle  Patient self care activities - Over the next 30 days, patient will: o Check blood pressure a few times weekly, document, and provide at future appointments o Ensure daily salt intake < 2300 mg/day o Look into trying yoga to improve balance and add exercise to his day  Gout Lab Results  Component Value Date   LABURIC 8.8 (H) 10/15/2012    Pharmacist  Clinical Goal(s): o Over the next 90 days, patient will work with PharmD and providers to achieve uric acid < 6 and avoid gout flare ups  Current regimen:  o Allopurinol 300 mg 1 tablet daily  Interventions: o Discussed Avoiding/limiting foods with high purine content:  wild game, such as veal, venison, and duck  red meat  some seafood, including tuna, sardines, anchovies, herring, mussels, codfish, scallops, trout, and haddock  organ meat, such as liver, kidneys, and thymus glands, which are known as sweetbreads o Avoiding/limiting foods to allow the body to process purines more effectively:  High-fat foods: Fat holds uric acid in the kidneys, so a person should avoid fried foods, full-fat dairy products, rich desserts, and other high-fat items.  Alcohol: Beer and whiskey are high in purines, but some research shows that all alcohol consumption can raise uric acid levels. Alcohol also causes dehydration, which hampers the bodys ability to flush  out uric acid.  Sweetened beverages: Fructose is an ingredient in many sweetened beverages, including fruit juices and sodas, and consuming too much puts a person at risk for gout  Patient self care activities - Over the next 90 days, patient will: o Continue current medication  GERD/Hiatal hernia  Pharmacist Clinical Goal(s): o Over the next 90 days, patient will work with PharmD and providers to avoid symptoms of heartburn or indigestion  Current regimen:  o Omeprazole 40 mg 1 capsule three times a week (Mon, Wed, Fri)  Interventions: o Discussed risks of long term use of PPIs including risk for C. Diff infection  Patient self care activities - Over the next 90 days, patient will: o Continue current medication  Colitis  Pharmacist Clinical Goal(s) o Over the next 90 days, patient will work with PharmD and providers to manage symptoms of colitis  Current regimen:  o Budesonide 3 mg 3 capsules daily  Patient self care  activities - Over the next 90 days, patient will: o Continue current medication  Pain  Pharmacist Clinical Goal(s) o Over the next 90 days, patient will work with PharmD and providers to control pain  Current regimen:   Acetaminophen 500 mg 1 tablet as needed  Ibuprofen 200 mg 1 tablet as needed  Interventions: o Discussed the risks of using NSAIDs such as ibuprofen on kidneys, especially with chronic use o Discussed the recommendation for the maximum daily dose of Tylenol of 3,000 mg/day  Patient self care activities - Over the next 90 days, patient will: o Try to use Tylenol (acetaminophen) more frequently if needed for pain   Medication management  Pharmacist Clinical Goal(s): o Over the next 90 days, patient will work with PharmD and providers to maintain optimal medication adherence  [facility]  Interventions o Comprehensive medication review performed. o Utilize [facility] pharmacy for medication synchronization, packaging and delivery  Patient self care activities - Over the next 90 days, patient will: o Take medications as prescribed o Report any questions or concerns to PharmD and/or provider(s)  Initial goal documentation       SDOH Interventions     Most Recent Value  SDOH Interventions  Financial Strain Interventions Intervention Not Indicated  Transportation Interventions Intervention Not Indicated       Hypertension   BP goal is:  <140/90  Office blood pressures are  BP Readings from Last 3 Encounters:  03/09/20 122/62  03/07/20 (!) 144/88  02/22/20 (!) 152/90   Patient checks BP at home infrequently Patient home BP readings are ranging: n/a  Patient has failed these meds in the past: none Patient is currently controlled on the following medications:   Atenolol 25 mg 1 tablet daily  Chlorthalidone 25 mg 1/2 tablet daily  Lisinopril 5 mg 1 tablet daily  We discussed diet and exercise extensively -DASH eating plan  recommendations:  Emphasizes vegetables, fruits, and whole-grains  Includes fat-free or low-fat dairy products, fish, poultry, beans, nuts, and vegetable oils  Limits foods that are high in saturated fat. These foods include fatty meats, full-fat dairy products, and tropical oils such as coconut, palm kernel, and palm oils.  Limits sugar-sweetened beverages and sweets  Limiting sodium intake to < 1500 mg/day -Discussed recommendations for moderate aerobic exercise for 150 minutes/week spread out over 5 days for heart healthy lifestyle -Diet: does salt his food; discussed limiting salt intake -Recommended trying yoga to help with balance  Plan  Continue current medications      Gout  Lab Results  Component Value Date   LABURIC 8.8 (H) 10/15/2012    Patient is currently controlled on:   Allopurinol 300 mg 1 tablet daily  We discussed: Avoiding/limiting foods with high purine content:  wild game, such as veal, venison, and duck  red meat  some seafood, including tuna, sardines, anchovies, herring, mussels, codfish, scallops, trout, and haddock  organ meat, such as liver, kidneys, and thymus glands, which are known as sweetbreads  Avoiding/limiting foods to allow the body to process purines more effectively:  High-fat foods: Fat holds uric acid in the kidneys, so a person should avoid fried foods, full-fat dairy products, rich desserts, and other high-fat items.  Alcohol: Beer and whiskey are high in purines, but some research shows that all alcohol consumption can raise uric acid levels. Alcohol also causes dehydration, which hampers the bodys ability to flush out uric acid.  Sweetened beverages: Fructose is an ingredient in many sweetened beverages, including fruit juices and sodas, and consuming too much puts a person at risk for gout.   Plan Recommend repeat UA.  Continue current medications  BPH   PSA  Date Value Ref Range Status  06/16/2019 2.85 0.10 -  4.00 ng/mL Final    Comment:    Test performed using Access Hybritech PSA Assay, a parmagnetic partical, chemiluminecent immunoassay.  06/09/2018 2.75 0.10 - 4.00 ng/mL Final    Comment:    Test performed using Access Hybritech PSA Assay, a parmagnetic partical, chemiluminecent immunoassay.  05/02/2017 3.40 0.10 - 4.00 ng/mL Final    Comment:    Test performed using Access Hybritech PSA Assay, a parmagnetic partical, chemiluminecent immunoassay.     Patient has failed these meds in past: tamsulosin (not as effective) Patient is currently controlled on the following medications:   Dutasteride 0.5 mg 1 capsule daily  We discussed:  Patient currently pays $149 for 3 months of dutasteride with goodrx; he has noticed some relief with bladder control  Plan Will discuss with PCP about switching to lower cost alternative finasteride.  Continue current medications   Colitis   Patient is currently controlled on the following medications:   Budesonide 3 mg 3 capsules daily  We discussed:  Patient picked up a taper prescription but was unsure if he was to start taper or not  Plan Will reach out to GI about tapering.  Continue current medications   GERD/Hiatal hernia   Patient has failed these meds in past: none Patient is currently controlled on the following medications:   Omeprazole 40 mg 1 capsule three times a week (Mon, Wed, Fri)  We discussed:  Long term risks of PPIs such as c diff infection given patient has a history     Plan Will send message to GI about continuing taper for omeprazole due to C diff risk. Continue current medications   Pain   Patient has failed these meds in past: none Patient is currently controlled on the following medications:   Acetaminophen 500 mg 1 tablet as needed  Ibuprofen 200 mg 1 tablet as needed (not taking recently)  We discussed:  Preference of Tylenol over Advil due to CKD (avoid NSAIDs); maximum recommended daily dose of 3,000  mg/day of Tylenol  Plan  Continue current medications     Vitamins   Patient is currently on the following medications:   Preservision Areds 1 capsule twice daily  Plan  Continue current medications  Vaccines   Reviewed and discussed patient's vaccination history.    Immunization History  Administered Date(s) Administered   Fluad Quad(high Dose 65+) 06/16/2019, 08/10/2020   Influenza Split 08/13/2011, 09/16/2012   Influenza Whole 07/31/2007, 06/30/2008, 07/14/2009   Influenza, High Dose Seasonal PF 08/12/2013, 08/03/2014, 07/19/2015, 06/18/2016, 07/29/2017, 06/09/2018   Pneumococcal Conjugate-13 02/16/2014   Pneumococcal Polysaccharide-23 10/01/1999, 08/23/2009   Td 10/01/2003   Tetanus 02/16/2014   Zoster 10/16/2012   Patient reported getting COVID vaccines on 10/27/19 and 11/17/19 (lots [id number], [id number]) at the [facility]. Sent message to CMA to add to his chart.  Patient received influenza vaccine during appointment.   Plan  Recommended patient receive Shingrix vaccine at pharmacy.   Medication Management   [facility] Uses pill box? No - wife has been asking him to but he hasnt Pt endorses 90% compliance  We discussed: Discussed benefits of medication synchronization, packaging and delivery as well as enhanced pharmacist oversight with [facility].    Plan  Utilize [facility] pharmacy for medication synchronization, packaging and delivery    Follow up: 3 month phone visit BP assessment in 1 month  Jeni Salles, PharmD Clinical Pharmacist [facility]

## 2020-08-09 NOTE — Chronic Care Management (AMB) (Signed)
    Chronic Care Management Pharmacy Assistant   Name: Shane [name]  MRN: [id number] DOB: [birth year]  Reason for Encounter: Medication Review/Initial Questions for Pharmacist visit on 08/10/2020  Patient Questions:  1. Have you seen any other providers since your last visit? No 2. Any changes in your medications or health? No 3. Any side effects from any medications? No 4. 4. Do you have any symptoms or problems not managed by your medications? No 5. Any concerns about your health right now? No 6. Has your provider asked that you check blood pressure, blood sugar, or follow a special diet at home? No 7. Do you get any type of exercise regularly? No 8. Can you think of a goal you would like to reach for your health?  . He would like to stay health 9. Do you have any problems getting your medications? No 10. Is there anything that you would like to discuss during the appointment? No  The patient was asked to please bring medications, blood pressure/ blood sugar log, and supplements to your appointment.  PCP : Dorothyann Peng, NP  Allergies:  No Known Allergies  Medications: Outpatient Encounter Medications as of 08/09/2020  Medication Sig  . acetaminophen (TYLENOL) 500 MG tablet Take 500-1,000 mg by mouth every 6 (six) hours as needed (for pain).  Marland Kitchen allopurinol (ZYLOPRIM) 300 MG tablet TAKE 1 TABLET BY MOUTH EVERY DAY  . atenolol (TENORMIN) 25 MG tablet TAKE 1 TABLET BY MOUTH EVERY DAY  . budesonide (ENTOCORT EC) 3 MG 24 hr capsule Take 3 capsules (9 mg total) by mouth daily.  . chlorthalidone (HYGROTON) 25 MG tablet TAKE 1/2 TABLET BY MOUTH EVERY DAY  . dutasteride (AVODART) 0.5 MG capsule TAKE 1 CAPSULE BY MOUTH EVERY DAY  . ibuprofen (ADVIL,MOTRIN) 200 MG tablet Take 200-600 mg by mouth every 6 (six) hours as needed (for pain).  Marland Kitchen lisinopril (ZESTRIL) 5 MG tablet Take 1 tablet (5 mg total) by mouth daily.  . Multiple Vitamins-Minerals (PRESERVISION AREDS 2) CAPS Take 1 capsule  by mouth 2 (two) times daily.  Marland Kitchen omeprazole (PRILOSEC) 40 MG capsule TAKE 1 CAPSULE BY MOUTH EVERY DAY   No facility-administered encounter medications on file as of 08/09/2020.    Current Diagnosis: Patient Active Problem List   Diagnosis Date Noted  . Advanced nonexudative age-related macular degeneration of left eye with subfoveal involvement 06/19/2020  . Left epiretinal membrane 06/19/2020  . Advanced nonexudative age-related macular degeneration of right eye with subfoveal involvement 06/19/2020  . Exudative age-related macular degeneration of right eye with inactive choroidal neovascularization ([place]) 06/19/2020  . Chronic kidney disease (CKD), stage III (moderate) ([place]) 06/16/2019  . Clostridium difficile infection   . Hypertriglyceridemia 12/01/2015  . EXTRINSIC ASTHMA, UNSPECIFIED 01/11/2009  . SPINAL STENOSIS 05/24/2008  . PROSTATE SPECIFIC ANTIGEN, ELEVATED 05/24/2008  . OBESITY 08/13/2006  . ANEMIA-IRON DEFICIENCY 08/13/2006  . Essential hypertension 08/13/2006  . ALLERGIC RHINITIS 08/13/2006  . GERD 08/13/2006  . HIATAL HERNIA 08/13/2006  . DEGENERATIVE JOINT DISEASE 08/13/2006    Goals Addressed   None     Follow-Up:  Pharmacist Review   Maia Breslow, [place] Assistant [phone]

## 2020-08-10 ENCOUNTER — Other Ambulatory Visit: Payer: Self-pay

## 2020-08-10 ENCOUNTER — Ambulatory Visit (INDEPENDENT_AMBULATORY_CARE_PROVIDER_SITE_OTHER): Payer: Medicare HMO

## 2020-08-10 ENCOUNTER — Other Ambulatory Visit: Payer: Self-pay | Admitting: Adult Health

## 2020-08-10 DIAGNOSIS — I10 Essential (primary) hypertension: Secondary | ICD-10-CM

## 2020-08-10 DIAGNOSIS — M109 Gout, unspecified: Secondary | ICD-10-CM

## 2020-08-10 DIAGNOSIS — Z23 Encounter for immunization: Secondary | ICD-10-CM | POA: Diagnosis not present

## 2020-08-10 MED ORDER — FINASTERIDE 5 MG PO TABS: 5.0000 mg | ORAL_TABLET | Freq: Every day | ORAL | 0 refills | Status: DC

## 2020-08-10 MED ORDER — ATENOLOL 25 MG PO TABS: 25.0000 mg | ORAL_TABLET | Freq: Every day | ORAL | 0 refills | Status: DC

## 2020-08-10 MED ORDER — CHLORTHALIDONE 25 MG PO TABS: 12.5000 mg | ORAL_TABLET | Freq: Every day | ORAL | 0 refills | Status: DC

## 2020-08-10 MED ORDER — ALLOPURINOL 300 MG PO TABS: 300.0000 mg | ORAL_TABLET | Freq: Every day | ORAL | 0 refills | Status: DC

## 2020-08-15 NOTE — Patient Instructions (Addendum)
[place],  It was lovely getting to meet with you in person! Below is a summary of some of things we discussed during the visit. I also want you go to go ahead and make an appointment with [facility] as you are due for that and go ahead and stop taking the omeprazole as we had discussed per Dr. Corena Pilgrim recommendation.  Please call me if you have questions or need anything before our follow up!  Best, Maddie  Jeni Salles, PharmD Clinical Pharmacist [facility] [place]   Visit Information  Goals Addressed            This Visit's Progress   . Pharmacy Care Plan       CARE PLAN ENTRY (see longitudinal plan of care for additional care plan information)  Current Barriers:  . Chronic Disease Management support, education, and care coordination needs related to Hypertension, GERD, BPH, Gout, and colitis and pain   Hypertension BP Readings from Last 3 Encounters:  03/09/20 122/62  03/07/20 (!) 144/88  02/22/20 (!) 152/90   . Pharmacist Clinical Goal(s): o Over the next 30 days, patient will work with PharmD and providers to maintain BP goal <140/90 . Current regimen:  . Atenolol 25 mg 1 tablet daily . Chlorthalidone 25 mg 1/2 tablet daily . Lisinopril 5 mg 1 tablet daily . Interventions: o Discussed DASH eating plan recommendations: . Emphasizes vegetables, fruits, and whole-grains . Includes fat-free or low-fat dairy products, fish, poultry, beans, nuts, and vegetable oils . Limits foods that are high in saturated fat. These foods include fatty meats, full-fat dairy products, and tropical oils such as coconut, palm kernel, and palm oils. . Limits sugar-sweetened beverages and sweets . Limiting sodium intake to < 1500 mg/day o Discussed recommendations for moderate aerobic exercise for 150 minutes/week spread out over 5 days for heart healthy lifestyle . Patient self care activities - Over the next 30 days, patient will: o Check blood pressure a few  times weekly, document, and provide at future appointments o Ensure daily salt intake < 2300 mg/day o Look into trying yoga to improve balance and add exercise to his day  Gout Lab Results  Component Value Date   LABURIC 8.8 (H) 10/15/2012   . Pharmacist Clinical Goal(s): o Over the next 90 days, patient will work with PharmD and providers to achieve uric acid < 6 and avoid gout flare ups . Current regimen:  o Allopurinol 300 mg 1 tablet daily . Interventions: o Discussed Avoiding/limiting foods with high purine content: . wild game, such as veal, venison, and duck . red meat . some seafood, including tuna, sardines, anchovies, herring, mussels, codfish, scallops, trout, and haddock . organ meat, such as liver, kidneys, and thymus glands, which are known as sweetbreads o Avoiding/limiting foods to allow the body to process purines more effectively: . High-fat foods: Fat holds uric acid in the kidneys, so a person should avoid fried foods, full-fat dairy products, rich desserts, and other high-fat items. . Alcohol: Beer and whiskey are high in purines, but some research shows that all alcohol consumption can raise uric acid levels. Alcohol also causes dehydration, which hampers the body's ability to flush out uric acid. . Sweetened beverages: Fructose is an ingredient in many sweetened beverages, including fruit juices and sodas, and consuming too much puts a person at risk for gout . Patient self care activities - Over the next 90 days, patient will: o Continue current medication  GERD/Hiatal hernia . Pharmacist Clinical Goal(s):  o Over the next 90 days, patient will work with PharmD and providers to avoid symptoms of heartburn or indigestion . Current regimen:  o Omeprazole 40 mg 1 capsule three times a week (Mon, Wed, Fri) . Interventions: o Discussed risks of long term use of PPIs including risk for C. Diff infection . Patient self care activities - Over the next 90 days, patient  will: o Stop taking omeprazole as recommended by Dr. Loletha Carrow  BPH . Pharmacist Clinical Goal(s): o Over the next 90 days, patient will work with PharmD and providers to manage symptoms of enlarged prostate . Current regimen:  o Dutasteride 0.5 mg 1 capsule daily . Interventions: o Discussed cost of medication and lower cost alternative finasteride . Patient self care activities - Over the next 90 days, patient will: o Once finished with on hand supply of dutasteride, switch to finasteride daily  Colitis . Pharmacist Clinical Goal(s) o Over the next 90 days, patient will work with PharmD and providers to manage symptoms of colitis . Current regimen:  o Budesonide 3 mg 3 capsules daily . Patient self care activities - Over the next 90 days, patient will: o Continue current medication  Pain . Pharmacist Clinical Goal(s) o Over the next 90 days, patient will work with PharmD and providers to control pain . Current regimen:  . Acetaminophen 500 mg 1 tablet as needed . Ibuprofen 200 mg 1 tablet as needed . Interventions: o Discussed the risks of using NSAIDs such as ibuprofen on kidneys, especially with chronic use o Discussed the recommendation for the maximum daily dose of Tylenol of 3,000 mg/day . Patient self care activities - Over the next 90 days, patient will: o Try to use Tylenol (acetaminophen) more frequently if needed for pain   Medication management . Pharmacist Clinical Goal(s): o Over the next 90 days, patient will work with PharmD and providers to maintain optimal medication adherence . Current pharmacy: [facility] . Interventions o Comprehensive medication review performed. o Utilize [facility] for medication synchronization, packaging and delivery . Patient self care activities - Over the next 90 days, patient will: o Take medications as prescribed o Report any questions or concerns to PharmD and/or provider(s)  Initial goal documentation        Mr. Shane [name]  was given information about Chronic Care Management services today including:  1. CCM service includes personalized support from designated clinical staff supervised by his physician, including individualized plan of care and coordination with other care providers 2. 24/7 contact phone numbers for assistance for urgent and routine care needs. 3. Standard insurance, coinsurance, copays and deductibles apply for chronic care management only during months in which we provide at least 20 minutes of these services. Most insurances cover these services at 100%, however patients may be responsible for any copay, coinsurance and/or deductible if applicable. This service may help you avoid the need for more expensive face-to-face services. 4. Only one practitioner may furnish and bill the service in a calendar month. 5. The patient may stop CCM services at any time (effective at the end of the month) by phone call to the office staff.  Patient agreed to services and verbal consent obtained.   The patient verbalized understanding of instructions, educational materials, and care plan provided today and agreed to receive a mailed copy of patient instructions, educational materials, and care plan.  Telephone follow up appointment with pharmacy team member scheduled for: 3 months   How to Take Your Blood Pressure Blood pressure is  a measurement of how strongly your blood is pressing against the walls of your arteries. Arteries are blood vessels that carry blood from your heart throughout your body. Your health care provider takes your blood pressure at each office visit. You can also take your own blood pressure at home with a blood pressure machine. You may need to take your own blood pressure:  To confirm a diagnosis of high blood pressure (hypertension).  To monitor your blood pressure over time.  To make sure your blood pressure medicine is working. Supplies needed: To take your blood pressure, you will  need a blood pressure machine. You can buy a blood pressure machine, or blood pressure monitor, at most drugstores or online. There are several types of home blood pressure monitors. When choosing one, consider the following:  Choose a monitor that has an arm cuff.  Choose a cuff that wraps snugly around your upper arm. You should be able to fit only one finger between your arm and the cuff.  Do not choose a monitor that measures your blood pressure from your wrist or finger. Your health care provider can suggest a reliable monitor that will meet your needs. How to prepare To get the most accurate reading, avoid the following for 30 minutes before you check your blood pressure:  Drinking caffeine.  Drinking alcohol.  Eating.  Smoking.  Exercising. Five minutes before you check your blood pressure:  Empty your bladder.  Sit quietly without talking in a dining chair, rather than in a soft couch or armchair. How to take your blood pressure To check your blood pressure, follow the instructions in the manual that came with your blood pressure monitor. If you have a digital blood pressure monitor, the instructions may be as follows: 1. Sit up straight. 2. Place your feet on the floor. Do not cross your ankles or legs. 3. Rest your left arm at the level of your heart on a table or desk or on the arm of a chair. 4. Pull up your shirt sleeve. 5. Wrap the blood pressure cuff around the upper part of your left arm, 1 inch (2.5 cm) above your elbow. It is best to wrap the cuff around bare skin. 6. Fit the cuff snugly around your arm. You should be able to place only one finger between the cuff and your arm. 7. Position the cord inside the groove of your elbow. 8. Press the power button. 9. Sit quietly while the cuff inflates and deflates. 10. Read the digital reading on the monitor screen and write it down (record it). 11. Wait 2-3 minutes, then repeat the steps, starting at step 1. What  does my blood pressure reading mean? A blood pressure reading consists of a higher number over a lower number. Ideally, your blood pressure should be below 120/80. The first ("top") number is called the systolic pressure. It is a measure of the pressure in your arteries as your heart beats. The second ("bottom") number is called the diastolic pressure. It is a measure of the pressure in your arteries as the heart relaxes. Blood pressure is classified into four stages. The following are the stages for adults who do not have a short-term serious illness or a chronic condition. Systolic pressure and diastolic pressure are measured in a unit called mm Hg. Normal  Systolic pressure: below 761.  Diastolic pressure: below 80. Elevated  Systolic pressure: 607-371.  Diastolic pressure: below 80. Hypertension stage 1  Systolic pressure: 062-694.  Diastolic pressure:  80-89. Hypertension stage 2  Systolic pressure: 161 or above.  Diastolic pressure: 90 or above. You can have prehypertension or hypertension even if only the systolic or only the diastolic number in your reading is higher than normal. Follow these instructions at home:  Check your blood pressure as often as recommended by your health care provider.  Take your monitor to the next appointment with your health care provider to make sure: ? That you are using it correctly. ? That it provides accurate readings.  Be sure you understand what your goal blood pressure numbers are.  Tell your health care provider if you are having any side effects from blood pressure medicine. Contact a health care provider if:  Your blood pressure is consistently high. Get help right away if:  Your systolic blood pressure is higher than 180.  Your diastolic blood pressure is higher than 110. This information is not intended to replace advice given to you by your health care provider. Make sure you discuss any questions you have with your health  care provider. Document Revised: 08/29/2017 Document Reviewed: 02/23/2016 Elsevier Patient Education  2020 [employer].

## 2020-08-29 DIAGNOSIS — L820 Inflamed seborrheic keratosis: Secondary | ICD-10-CM | POA: Diagnosis not present

## 2020-08-29 DIAGNOSIS — Z85828 Personal history of other malignant neoplasm of skin: Secondary | ICD-10-CM | POA: Diagnosis not present

## 2020-08-29 DIAGNOSIS — C44319 Basal cell carcinoma of skin of other parts of face: Secondary | ICD-10-CM | POA: Diagnosis not present

## 2020-08-29 DIAGNOSIS — L821 Other seborrheic keratosis: Secondary | ICD-10-CM | POA: Diagnosis not present

## 2020-08-29 DIAGNOSIS — L570 Actinic keratosis: Secondary | ICD-10-CM | POA: Diagnosis not present

## 2020-09-19 DIAGNOSIS — C44319 Basal cell carcinoma of skin of other parts of face: Secondary | ICD-10-CM | POA: Diagnosis not present

## 2020-09-19 DIAGNOSIS — Z85828 Personal history of other malignant neoplasm of skin: Secondary | ICD-10-CM | POA: Diagnosis not present

## 2020-09-20 DIAGNOSIS — L821 Other seborrheic keratosis: Secondary | ICD-10-CM | POA: Diagnosis not present

## 2020-09-20 DIAGNOSIS — L570 Actinic keratosis: Secondary | ICD-10-CM | POA: Diagnosis not present

## 2020-09-20 DIAGNOSIS — L309 Dermatitis, unspecified: Secondary | ICD-10-CM | POA: Diagnosis not present

## 2020-09-20 DIAGNOSIS — Z85828 Personal history of other malignant neoplasm of skin: Secondary | ICD-10-CM | POA: Diagnosis not present

## 2020-09-26 ENCOUNTER — Other Ambulatory Visit: Payer: Self-pay | Admitting: Gastroenterology

## 2020-10-02 ENCOUNTER — Other Ambulatory Visit: Payer: Self-pay | Admitting: Adult Health

## 2020-10-03 ENCOUNTER — Telehealth: Payer: Self-pay | Admitting: Adult Health

## 2020-10-03 NOTE — Telephone Encounter (Signed)
Left message asking pt to call office regarding 10/05/20 awv appointment.  Please reschedule shannon only doing virtual appointments

## 2020-10-04 ENCOUNTER — Other Ambulatory Visit: Payer: Self-pay

## 2020-10-05 ENCOUNTER — Encounter: Payer: Self-pay | Admitting: Adult Health

## 2020-10-05 ENCOUNTER — Ambulatory Visit: Payer: Medicare HMO

## 2020-10-05 ENCOUNTER — Ambulatory Visit (INDEPENDENT_AMBULATORY_CARE_PROVIDER_SITE_OTHER): Payer: Medicare HMO | Admitting: Adult Health

## 2020-10-05 VITALS — BP 130/86 | Temp 97.7°F | Ht 70.0 in | Wt 199.0 lb

## 2020-10-05 DIAGNOSIS — E781 Pure hyperglyceridemia: Secondary | ICD-10-CM

## 2020-10-05 DIAGNOSIS — Z0000 Encounter for general adult medical examination without abnormal findings: Secondary | ICD-10-CM | POA: Diagnosis not present

## 2020-10-05 DIAGNOSIS — N400 Enlarged prostate without lower urinary tract symptoms: Secondary | ICD-10-CM

## 2020-10-05 DIAGNOSIS — I10 Essential (primary) hypertension: Secondary | ICD-10-CM | POA: Diagnosis not present

## 2020-10-05 DIAGNOSIS — M109 Gout, unspecified: Secondary | ICD-10-CM

## 2020-10-05 LAB — LIPID PANEL
Cholesterol: 182 mg/dL (ref 0–200)
HDL: 47.3 mg/dL (ref >39.00)
LDL Cholesterol: 110 mg/dL — ABNORMAL HIGH (ref 0–99)
NonHDL: 134.52
Total CHOL/HDL Ratio: 4
Triglycerides: 125 mg/dL (ref 0.0–149.0)
VLDL: 25 mg/dL (ref 0.0–40.0)

## 2020-10-05 LAB — CBC WITH DIFFERENTIAL/PLATELET
Basophils Absolute: 0 10*3/uL (ref 0.0–0.1)
Basophils Relative: 0.3 % (ref 0.0–3.0)
Eosinophils Absolute: 0.4 10*3/uL (ref 0.0–0.7)
Eosinophils Relative: 4.5 % (ref 0.0–5.0)
HCT: 40.1 % (ref 39.0–52.0)
Hemoglobin: 13.3 g/dL (ref 13.0–17.0)
Lymphocytes Relative: 9.8 % — ABNORMAL LOW (ref 12.0–46.0)
Lymphs Abs: 0.9 10*3/uL (ref 0.7–4.0)
MCHC: 33.3 g/dL (ref 30.0–36.0)
MCV: 93.8 fl (ref 78.0–100.0)
Monocytes Absolute: 0.8 10*3/uL (ref 0.1–1.0)
Monocytes Relative: 8 % (ref 3.0–12.0)
Neutro Abs: 7.4 10*3/uL (ref 1.4–7.7)
Neutrophils Relative %: 77.4 % — ABNORMAL HIGH (ref 43.0–77.0)
Platelets: 178 10*3/uL (ref 150.0–400.0)
RBC: 4.27 Mil/uL (ref 4.22–5.81)
RDW: 14.8 % (ref 11.5–15.5)
WBC: 9.6 10*3/uL (ref 4.0–10.5)

## 2020-10-05 LAB — COMPREHENSIVE METABOLIC PANEL
ALT: 11 U/L (ref 0–53)
AST: 18 U/L (ref 0–37)
Albumin: 3.9 g/dL (ref 3.5–5.2)
Alkaline Phosphatase: 26 U/L — ABNORMAL LOW (ref 39–117)
BUN: 50 mg/dL — ABNORMAL HIGH (ref 6–23)
CO2: 24 mEq/L (ref 19–32)
Calcium: 9.2 mg/dL (ref 8.4–10.5)
Chloride: 110 mEq/L (ref 96–112)
Creatinine, Ser: 2.05 mg/dL — ABNORMAL HIGH (ref 0.40–1.50)
GFR: 28.84 mL/min — ABNORMAL LOW (ref >60.00)
Glucose, Bld: 105 mg/dL — ABNORMAL HIGH (ref 70–99)
Potassium: 4.8 mEq/L (ref 3.5–5.1)
Sodium: 141 mEq/L (ref 135–145)
Total Bilirubin: 0.7 mg/dL (ref 0.2–1.2)
Total Protein: 6.3 g/dL (ref 6.0–8.3)

## 2020-10-05 LAB — TSH: TSH: 3.57 u[IU]/mL (ref 0.35–4.50)

## 2020-10-05 NOTE — Progress Notes (Signed)
Subjective:    Patient ID: Shane [name], male    DOB: 1931-10-14, 87 y.o.   MRN: [id number]  HPI Patient presents for yearly preventative medicine examination. He is a pleasant and remarkable  87 year old male who  has a past medical history of Allergy, Anemia, iron deficiency, C. difficile diarrhea, Cancer ([place]), Cataract, DDD (degenerative disc disease), cervical, Dysplastic nevus of left upper extremity, Elevated PSA, Essential hypertension, Extrinsic asthma, GERD (gastroesophageal reflux disease), Gout, Hiatal hernia, Macular degeneration, Neuromuscular disorder ([place]), Pneumonia, and Spinal stenosis.  Essential Hypertension -currently prescribed atenolol 25 mg daily, chlorthalidone 12.5 mg, and lisinopril 5 mg.  Denies dizziness, lightheadedness, chest pain, or shortness of breath.  He does monitor his blood pressure at home and reports readings consistently in the 120s to 130s over 70s to 80s. BP Readings from Last 3 Encounters:  10/05/20 130/86  03/09/20 122/62  03/07/20 (!) 144/88   H/o Gout -takes allopurinol 300 mg daily.  He denies any recent gout flares  BPH-controlled with Proscar  Hyperlipidemia-diet controlled Lab Results  Component Value Date   CHOL 169 06/16/2019   HDL 34.50 (L) 06/16/2019   LDLCALC 112 (H) 06/16/2019   LDLDIRECT 102.7 05/16/2008   TRIG 113.0 06/16/2019   CHOLHDL 5 06/16/2019   He is seen by Dr. 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  Appropriate screening laboratory values were ordered for the patient including screening of hyperlipidemia, renal function and hepatic function.  Medication reconciliation,  past medical history, social history, problem list and allergies were reviewed in detail with the patient  Goals were established with regard to weight loss, exercise, and  diet in compliance with  medications  End of life planning was discussed.  He has no acute complaints today   Review of Systems  Constitutional: Negative.   HENT: Negative.   Eyes: Negative.   Respiratory: Negative.   Cardiovascular: Negative.   Gastrointestinal: Negative.   Endocrine: Negative.   Genitourinary: Negative.   Musculoskeletal: Negative.   Skin: Negative.   Allergic/Immunologic: Negative.   Neurological: Negative.   Hematological: Negative.   Psychiatric/Behavioral: Negative.   All other systems reviewed and are negative.  Past Medical History:  Diagnosis Date  . Allergy   . Anemia, iron deficiency   . C. difficile diarrhea   . 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 Cataract    bil cataracts removed  . DDD (degenerative disc disease), cervical   . Dysplastic nevus of left upper extremity   . Elevated PSA   . Essential hypertension   . Extrinsic asthma   . GERD (gastroesophageal reflux disease)    no recent  . Gout   . Hiatal hernia   . Macular degeneration   . Neuromuscular disorder ([place])    hiatal hernia  . Pneumonia    2014ish  . Spinal stenosis     Social History   Socioeconomic History  . Marital status: Married    Spouse name: Not on file  . Number of children: 3  . Years of education: Not on file  . Highest education level: Not on file  Occupational History  . Occupation: retired  Tobacco Use  . Smoking status: Former Smoker    Years: 35.00    Types: Cigarettes  . Smokeless tobacco: Never Used  . Tobacco comment: quit late 1980's  Vaping Use  . Vaping Use:  Never used  Substance and Sexual Activity  . Alcohol use: No  . Drug use: No  . Sexual activity: Not on file  Other Topics Concern  . Not on file  Social History Narrative   Retired from [employer], educational    Married for 55 years    Three children all live locally    [place] to play golf and go to [employer].    Social Determinants of Health   Financial Resource Strain: Low Risk   . Difficulty of  Paying Living Expenses: Not very hard  Food Insecurity: Not on file  Transportation Needs: No Transportation Needs  . Lack of Transportation (Medical): No  . Lack of Transportation (Non-Medical): No  Physical Activity: Not on file  Stress: Not on file  Social Connections: Not on file  Intimate Partner Violence: Not on file    Past Surgical History:  Procedure Laterality Date  . BACK SURGERY    . Bone Spur Right    Shoulder  . COLONOSCOPY    . COLONOSCOPY W/ POLYPECTOMY    . COLONOSCOPY WITH PROPOFOL N/A 11/19/2016   Procedure: COLONOSCOPY WITH PROPOFOL;  Surgeon: Doran Stabler, MD;  Location: [facility];  Service: Gastroenterology;  Laterality: N/A;  . FECAL TRANSPLANT N/A 11/19/2016   Procedure: FECAL TRANSPLANT;  Surgeon: Doran Stabler, MD;  Location: [facility];  Service: Gastroenterology;  Laterality: N/A;  . MOUTH SURGERY     2 teeth removed resulting in a dry socket in one of them  . POLYPECTOMY    . SKIN CANCER EXCISION      Family History  Problem Relation Age of Onset  . Multiple myeloma Brother   . Prostate cancer Brother   . Hypertension Father        ?  . Lung cancer Sister   . Colon cancer Neg Hx   . Stomach cancer Neg Hx   . Esophageal cancer Neg Hx   . Rectal cancer Neg Hx     No Known Allergies  Current Outpatient Medications on File Prior to Visit  Medication Sig Dispense Refill  . acetaminophen (TYLENOL) 500 MG tablet Take 500-1,000 mg by mouth every 6 (six) hours as needed (for pain).    Marland Kitchen allopurinol (ZYLOPRIM) 300 MG tablet Take 1 tablet (300 mg total) by mouth daily. 90 tablet 0  . atenolol (TENORMIN) 25 MG tablet Take 1 tablet (25 mg total) by mouth daily. 90 tablet 0  . budesonide (ENTOCORT EC) 3 MG 24 hr capsule Take 3 capsules (9 mg total) by mouth daily. 270 capsule 1  . chlorthalidone (HYGROTON) 25 MG tablet Take 0.5 tablets (12.5 mg total) by mouth daily. 45 tablet 0  . finasteride (PROSCAR) 5 MG tablet Take 1 tablet (5 mg  total) by mouth daily. 90 tablet 0  . ibuprofen (ADVIL,MOTRIN) 200 MG tablet Take 200-600 mg by mouth every 6 (six) hours as needed (for pain).    Marland Kitchen lisinopril (ZESTRIL) 5 MG tablet Take 1 tablet (5 mg total) by mouth daily. 90 tablet 3  . Multiple Vitamins-Minerals (PRESERVISION AREDS 2) CAPS Take 1 capsule by mouth 2 (two) times daily.    Marland Kitchen omeprazole (PRILOSEC) 40 MG capsule Take 1 capsule (40 mg total) by mouth daily. (Patient not taking: Reported on 10/05/2020) 90 capsule 0   No current facility-administered medications on file prior to visit.    BP 130/86   Temp 97.7 F (36.5 C)   Ht _0  (1.778 m)   Wt  199 lb (90.3 kg)   BMI 28.55 kg/m       Objective:   Physical Exam Vitals and nursing note reviewed.  Constitutional:      General: He is not in acute distress.    Appearance: Normal appearance. He is well-developed and overweight.  HENT:     Head: Normocephalic and atraumatic.     Right Ear: Tympanic membrane, ear canal and external ear normal. There is no impacted cerumen.     Left Ear: Tympanic membrane, ear canal and external ear normal. There is no impacted cerumen.     Nose: Nose normal. No congestion or rhinorrhea.     Mouth/Throat:     Mouth: Mucous membranes are moist.     Pharynx: Oropharynx is clear. No oropharyngeal exudate or posterior oropharyngeal erythema.  Eyes:     General:        Right eye: No discharge.        Left eye: No discharge.     Extraocular Movements: Extraocular movements intact.     Conjunctiva/sclera: Conjunctivae normal.     Pupils: Pupils are equal, round, and reactive to light.  Neck:     Vascular: No carotid bruit.     Trachea: No tracheal deviation.  Cardiovascular:     Rate and Rhythm: Normal rate and regular rhythm.     Pulses: Normal pulses.     Heart sounds: Normal heart sounds. No murmur heard. No friction rub. No gallop.   Pulmonary:     Effort: Pulmonary effort is normal. No respiratory distress.     Breath sounds:  Normal breath sounds. No stridor. No wheezing, rhonchi or rales.  Chest:     Chest wall: No tenderness.  Abdominal:     General: Bowel sounds are normal. There is no distension.     Palpations: Abdomen is soft. There is no mass.     Tenderness: There is no abdominal tenderness. There is no right CVA tenderness, left CVA tenderness, guarding or rebound.     Hernia: No hernia is present.  Musculoskeletal:        General: No swelling, tenderness, deformity or signs of injury. Normal range of motion.     Right lower leg: No edema.     Left lower leg: No edema.  Lymphadenopathy:     Cervical: No cervical adenopathy.  Skin:    General: Skin is warm and dry.     Capillary Refill: Capillary refill takes less than 2 seconds.     Coloration: Skin is not jaundiced or pale.     Findings: No bruising, erythema, lesion or rash.  Neurological:     General: No focal deficit present.     Mental Status: He is alert and oriented to person, place, and time.     Cranial Nerves: No cranial nerve deficit.     Sensory: No sensory deficit.     Motor: No weakness.     Coordination: Coordination normal.     Gait: Gait normal.     Deep Tendon Reflexes: Reflexes normal.  Psychiatric:        Mood and Affect: Mood normal.        Behavior: Behavior normal.        Thought Content: Thought content normal.        Judgment: Judgment normal.       Assessment & Plan:  1. Routine general medical examination at a health care facility - Remarkable 87 year old male - Continue to stay active  and exercise - Follow up in one year or sooner if needed - CBC with Differential/Platelet; Future - Comprehensive metabolic panel; Future - Lipid panel; Future - TSH; Future  2. Essential hypertension - well controlled. No change in medications  - CBC with Differential/Platelet; Future - Comprehensive metabolic panel; Future - Lipid panel; Future - TSH; Future  3. Gout, unspecified cause, unspecified chronicity,  unspecified site - Well controlled with allopurinol   4. Hypertriglyceridemia - Consider statin  - CBC with Differential/Platelet; Future - Comprehensive metabolic panel; Future - Lipid panel; Future - TSH; Future  5. Benign prostatic hyperplasia without lower urinary tract symptoms - Continue with Proscar - CBC with Differential/Platelet; Future - Comprehensive metabolic panel; Future - Lipid panel; Future - TSH; Future  Dorothyann Peng, NP

## 2020-10-05 NOTE — Addendum Note (Signed)
Addended by: Marrion Coy on: 10/05/2020 09:32 AM   Modules accepted: Orders

## 2020-10-05 NOTE — Patient Instructions (Signed)
It was great seeing you today!   Continue to stay active and eat healthy   We will follow up with you regarding your blood work   I will see you back in one year or sooner if needed

## 2020-10-09 ENCOUNTER — Telehealth: Payer: Self-pay | Admitting: Pharmacist

## 2020-10-09 NOTE — Chronic Care Management (AMB) (Signed)
Chronic Care Management Pharmacy Assistant   Name: Shane [name]  MRN: [id number] DOB: 05/23/1932  Reason for Encounter: Medication Review  PCP : Dorothyann Peng, NP  Allergies:  No Known Allergies  Medications: Outpatient Encounter Medications as of 10/09/2020  Medication Sig  . acetaminophen (TYLENOL) 500 MG tablet Take 500-1,000 mg by mouth every 6 (six) hours as needed (for pain).  Marland Kitchen allopurinol (ZYLOPRIM) 300 MG tablet Take 1 tablet (300 mg total) by mouth daily.  Marland Kitchen atenolol (TENORMIN) 25 MG tablet Take 1 tablet (25 mg total) by mouth daily.  . budesonide (ENTOCORT EC) 3 MG 24 hr capsule Take 3 capsules (9 mg total) by mouth daily.  . chlorthalidone (HYGROTON) 25 MG tablet Take 0.5 tablets (12.5 mg total) by mouth daily.  . finasteride (PROSCAR) 5 MG tablet Take 1 tablet (5 mg total) by mouth daily.  Marland Kitchen ibuprofen (ADVIL,MOTRIN) 200 MG tablet Take 200-600 mg by mouth every 6 (six) hours as needed (for pain).  Marland Kitchen lisinopril (ZESTRIL) 5 MG tablet Take 1 tablet (5 mg total) by mouth daily.  . Multiple Vitamins-Minerals (PRESERVISION AREDS 2) CAPS Take 1 capsule by mouth 2 (two) times daily.  Marland Kitchen omeprazole (PRILOSEC) 40 MG capsule Take 1 capsule (40 mg total) by mouth daily. (Patient not taking: Reported on 10/05/2020)   No facility-administered encounter medications on file as of 10/09/2020.    Current Diagnosis: Patient Active Problem List   Diagnosis Date Noted  . Advanced nonexudative age-related macular degeneration of left eye with subfoveal involvement 06/19/2020  . Left epiretinal membrane 06/19/2020  . Advanced nonexudative age-related macular degeneration of right eye with subfoveal involvement 06/19/2020  . Exudative age-related macular degeneration of right eye with inactive choroidal neovascularization ([place]) 06/19/2020  . Chronic kidney disease (CKD), stage III (moderate) ([place]) 06/16/2019  . Clostridium difficile infection   . Hypertriglyceridemia 12/01/2015  .  EXTRINSIC ASTHMA, UNSPECIFIED 01/11/2009  . SPINAL STENOSIS 05/24/2008  . PROSTATE SPECIFIC ANTIGEN, ELEVATED 05/24/2008  . OBESITY 08/13/2006  . ANEMIA-IRON DEFICIENCY 08/13/2006  . Essential hypertension 08/13/2006  . ALLERGIC RHINITIS 08/13/2006  . GERD 08/13/2006  . HIATAL HERNIA 08/13/2006  . DEGENERATIVE JOINT DISEASE 08/13/2006    Goals Addressed   None    Reviewed chart for medication changes ahead of medication coordination call. Reviewed OVs, Consults, or hospital visits since last care coordination call/Pharmacist visit.   10-05-20 Dorothyann Peng, NP Family Medicine Reviewed medication changes indicated  - 01/11- Simvastatin (ZOCOR) 10 MG tablet at bedtime  BP Readings from Last 3 Encounters:  10/05/20 130/86  03/09/20 122/62  03/07/20 (!) 144/88    No results found for: HGBA1C   Patient obtains medications through Adherence Packaging  90 Days   Last adherence delivery included:  . Lisinopril (ZESTRIL) 5 mg: one tablet at breakfast . Allopurinol (ZYLOPRIM) 300 mg: one tablet at breakfast . Atenolol (TENORMIN) 25 mg: one tablet at breakfast . Chlorthalidone (HYGROTON) 25 mg:   (12.5 mg) at breakfast . Omeprazole (prilosec) 40 mg: one at breakfast . Finasteride (PROSCAR) 5 mg: one tablet at bedtime  Patient declined the following last month due to PRN use. . Budesonide (ENTOCORT EC) 3 MG 24 hr capsule  Patient is due for next adherence delivery on: 10/12/2020. Called patient and reviewed medications and coordinated delivery. This delivery to include: . Lisinopril (ZESTRIL) 5 mg: one tablet at breakfast . Allopurinol (ZYLOPRIM) 300 mg: one tablet at breakfast . Atenolol (TENORMIN) 25 mg: one tablet at breakfast . Chlorthalidone (HYGROTON) 25 mg:   (  12.5 mg) at breakfast . Simvastatin (ZOCOR) 10 mg: one at bedtime  . Finasteride (PROSCAR) 5 mg: one tablet at bedtime  Patient declined the following medications dur to PRN use. . Budesonide (ENTOCORT EC) 3  MG 24 hr capsule  Patient needs refills for : - Allopurinol (ZYLOPRIM) 300 mg: one tablet at breakfast - Atenolol (TENORMIN) 25 mg: one tablet at breakfast - Chlorthalidone (HYGROTON) 25 mg:   (12.5 mg) at breakfast - Finasteride (PROSCAR) 5 mg: one tablet at bedtime  Confirmed delivery date of 10/16/2020, advised patient that pharmacy will contact them the morning of delivery.  Follow-Up:  Comptroller and Pharmacist Review.  Maia Breslow, [place] Assistant [phone]

## 2020-10-10 ENCOUNTER — Other Ambulatory Visit: Payer: Self-pay | Admitting: Family Medicine

## 2020-10-10 DIAGNOSIS — N289 Disorder of kidney and ureter, unspecified: Secondary | ICD-10-CM

## 2020-10-10 MED ORDER — SIMVASTATIN 10 MG PO TABS: 10.0000 mg | ORAL_TABLET | Freq: Every day | ORAL | 3 refills | Status: DC

## 2020-10-11 ENCOUNTER — Other Ambulatory Visit: Payer: Self-pay | Admitting: Adult Health

## 2020-10-11 DIAGNOSIS — M109 Gout, unspecified: Secondary | ICD-10-CM

## 2020-10-11 DIAGNOSIS — I10 Essential (primary) hypertension: Secondary | ICD-10-CM

## 2020-10-11 MED ORDER — ATENOLOL 25 MG PO TABS: 25.0000 mg | ORAL_TABLET | Freq: Every day | ORAL | 3 refills | Status: DC

## 2020-10-11 MED ORDER — ALLOPURINOL 300 MG PO TABS: 300.0000 mg | ORAL_TABLET | Freq: Every day | ORAL | 3 refills | Status: DC

## 2020-10-11 MED ORDER — CHLORTHALIDONE 25 MG PO TABS: 12.5000 mg | ORAL_TABLET | Freq: Every day | ORAL | 3 refills | Status: DC

## 2020-10-11 MED ORDER — FINASTERIDE 5 MG PO TABS: 5.0000 mg | ORAL_TABLET | Freq: Every day | ORAL | 3 refills | Status: AC

## 2020-10-17 ENCOUNTER — Telehealth: Payer: Self-pay | Admitting: Adult Health

## 2020-10-17 NOTE — Telephone Encounter (Signed)
Stop ibuprofen. If needed he can take tylenol

## 2020-10-17 NOTE — Telephone Encounter (Signed)
Dawn the referral coordinator is calling and wanted to let the provider know that the patients chart has been rated at a level 2 by Betsey Amen and she is recommending that patient stop talking the ibuprofen, please advise. CB is [phone] ext [phone]

## 2020-10-18 ENCOUNTER — Ambulatory Visit (INDEPENDENT_AMBULATORY_CARE_PROVIDER_SITE_OTHER): Payer: Medicare HMO

## 2020-10-18 ENCOUNTER — Ambulatory Visit: Payer: Medicare HMO

## 2020-10-18 DIAGNOSIS — Z0000 Encounter for general adult medical examination without abnormal findings: Secondary | ICD-10-CM | POA: Diagnosis not present

## 2020-10-18 NOTE — Telephone Encounter (Signed)
Left a message with patient's wife for a return call. 

## 2020-10-18 NOTE — Patient Instructions (Signed)
Mr. Shane [name] , Thank you for taking time to come for your Medicare Wellness Visit. I appreciate your ongoing commitment to your health goals. Please review the following plan we discussed and let me know if I can assist you in the future.   Screening recommendations/referrals: Colonoscopy: No longer required  Recommended yearly ophthalmology/optometry visit for glaucoma screening and checkup Recommended yearly dental visit for hygiene and checkup  Vaccinations: Influenza vaccine: Up to date, next due fall 2022  Pneumococcal vaccine: Completed series  Tdap vaccine: Up to date, next due 02/17/2024 Shingles vaccine: Currently due for Shingrix, if you wish to receive we recommend that you do so at your local pharmacy.     Advanced directives: Advance directive discussed with you today. Even though you declined this today please call our office should you change your mind and we can give you the proper paperwork for you to fill out.   Conditions/risks identified: None   Next appointment: None   Preventive Care 65 Years and Older, Male Preventive care refers to lifestyle choices and visits with your health care provider that can promote health and wellness. What does preventive care include?  A yearly physical exam. This is also called an annual well check.  Dental exams once or twice a year.  Routine eye exams. Ask your health care provider how often you should have your eyes checked.  Personal lifestyle choices, including:  Daily care of your teeth and gums.  Regular physical activity.  Eating a healthy diet.  Avoiding tobacco and drug use.  Limiting alcohol use.  Practicing safe sex.  Taking low doses of aspirin every day.  Taking vitamin and mineral supplements as recommended by your health care provider. What happens during an annual well check? The services and screenings done by your health care provider during your annual well check will depend on your age, overall  health, lifestyle risk factors, and family history of disease. Counseling  Your health care provider may ask you questions about your:  Alcohol use.  Tobacco use.  Drug use.  Emotional well-being.  Home and relationship well-being.  Sexual activity.  Eating habits.  History of falls.  Memory and ability to understand (cognition).  Work and work Statistician. Screening  You may have the following tests or measurements:  Height, weight, and BMI.  Blood pressure.  Lipid and cholesterol levels. These may be checked every 5 years, or more frequently if you are over 49 years old.  Skin check.  Lung cancer screening. You may have this screening every year starting at age 33 if you have a 30-pack-year history of smoking and currently smoke or have quit within the past 15 years.  Fecal occult blood test (FOBT) of the stool. You may have this test every year starting at age 51.  Flexible sigmoidoscopy or colonoscopy. You may have a sigmoidoscopy every 5 years or a colonoscopy every 10 years starting at age 35.  Prostate cancer screening. Recommendations will vary depending on your family history and other risks.  Hepatitis C blood test.  Hepatitis B blood test.  Sexually transmitted disease (STD) testing.  Diabetes screening. This is done by checking your blood sugar (glucose) after you have not eaten for a while (fasting). You may have this done every 1-3 years.  Abdominal aortic aneurysm (AAA) screening. You may need this if you are a current or former smoker.  Osteoporosis. You may be screened starting at age 57 if you are at high risk. Talk with your health  care provider about your test results, treatment options, and if necessary, the need for more tests. Vaccines  Your health care provider may recommend certain vaccines, such as:  Influenza vaccine. This is recommended every year.  Tetanus, diphtheria, and acellular pertussis (Tdap, Td) vaccine. You may need a Td  booster every 10 years.  Zoster vaccine. You may need this after age 54.  Pneumococcal 13-valent conjugate (PCV13) vaccine. One dose is recommended after age 67.  Pneumococcal polysaccharide (PPSV23) vaccine. One dose is recommended after age 31. Talk to your health care provider about which screenings and vaccines you need and how often you need them. This information is not intended to replace advice given to you by your health care provider. Make sure you discuss any questions you have with your health care provider. Document Released: 10/13/2015 Document Revised: 06/05/2016 Document Reviewed: 07/18/2015 Elsevier Interactive Patient Education  2017 [place] Prevention in the Home Falls can cause injuries. They can happen to people of all ages. There are many things you can do to make your home safe and to help prevent falls. What can I do on the outside of my home?  Regularly fix the edges of walkways and driveways and fix any cracks.  Remove anything that might make you trip as you walk through a door, such as a raised step or threshold.  Trim any bushes or trees on the path to your home.  Use bright outdoor lighting.  Clear any walking paths of anything that might make someone trip, such as rocks or tools.  Regularly check to see if handrails are loose or broken. Make sure that both sides of any steps have handrails.  Any raised decks and porches should have guardrails on the edges.  Have any leaves, snow, or ice cleared regularly.  Use sand or salt on walking paths during winter.  Clean up any spills in your garage right away. This includes oil or grease spills. What can I do in the bathroom?  Use night lights.  Install grab bars by the toilet and in the tub and shower. Do not use towel bars as grab bars.  Use non-skid mats or decals in the tub or shower.  If you need to sit down in the shower, use a plastic, non-slip stool.  Keep the floor dry. Clean up  any water that spills on the floor as soon as it happens.  Remove soap buildup in the tub or shower regularly.  Attach bath mats securely with double-sided non-slip rug tape.  Do not have throw rugs and other things on the floor that can make you trip. What can I do in the bedroom?  Use night lights.  Make sure that you have a light by your bed that is easy to reach.  Do not use any sheets or blankets that are too big for your bed. They should not hang down onto the floor.  Have a firm chair that has side arms. You can use this for support while you get dressed.  Do not have throw rugs and other things on the floor that can make you trip. What can I do in the kitchen?  Clean up any spills right away.  Avoid walking on wet floors.  Keep items that you use a lot in easy-to-reach places.  If you need to reach something above you, use a strong step stool that has a grab bar.  Keep electrical cords out of the way.  Do not use floor  polish or wax that makes floors slippery. If you must use wax, use non-skid floor wax.  Do not have throw rugs and other things on the floor that can make you trip. What can I do with my stairs?  Do not leave any items on the stairs.  Make sure that there are handrails on both sides of the stairs and use them. Fix handrails that are broken or loose. Make sure that handrails are as long as the stairways.  Check any carpeting to make sure that it is firmly attached to the stairs. Fix any carpet that is loose or worn.  Avoid having throw rugs at the top or bottom of the stairs. If you do have throw rugs, attach them to the floor with carpet tape.  Make sure that you have a light switch at the top of the stairs and the bottom of the stairs. If you do not have them, ask someone to add them for you. What else can I do to help prevent falls?  Wear shoes that:  Do not have high heels.  Have rubber bottoms.  Are comfortable and fit you well.  Are  closed at the toe. Do not wear sandals.  If you use a stepladder:  Make sure that it is fully opened. Do not climb a closed stepladder.  Make sure that both sides of the stepladder are locked into place.  Ask someone to hold it for you, if possible.  Clearly mark and make sure that you can see:  Any grab bars or handrails.  First and last steps.  Where the edge of each step is.  Use tools that help you move around (mobility aids) if they are needed. These include:  Canes.  Walkers.  Scooters.  Crutches.  Turn on the lights when you go into a dark area. Replace any light bulbs as soon as they burn out.  Set up your furniture so you have a clear path. Avoid moving your furniture around.  If any of your floors are uneven, fix them.  If there are any pets around you, be aware of where they are.  Review your medicines with your doctor. Some medicines can make you feel dizzy. This can increase your chance of falling. Ask your doctor what other things that you can do to help prevent falls. This information is not intended to replace advice given to you by your health care provider. Make sure you discuss any questions you have with your health care provider. Document Released: 07/13/2009 Document Revised: 02/22/2016 Document Reviewed: 10/21/2014 Elsevier Interactive Patient Education  2017 [employer].

## 2020-10-18 NOTE — Progress Notes (Signed)
Subjective:   NIMAI BURBACH is a 87 y.o. male who presents for Medicare Annual/Subsequent preventive examination.  Virtual Visit via Video Note  I connected with Bonnita Nasuti on 10/18/20 at  1:15 PM EST by a video enabled telemedicine application and verified that I am speaking with the correct person using two identifiers.  Location: Patient: Home  Provider: Office    I discussed the limitations of evaluation and management by telemedicine and the availability of in person appointments. The patient expressed understanding and agreed to proceed.  Ofilia Neas, LPN    Review of Systems    N/A  Cardiac Risk Factors include: advanced age (>45mn, >>97women);male gender;dyslipidemia;hypertension     Objective:    Today's Vitals   There is no height or weight on file to calculate BMI.  Advanced Directives 10/18/2020 02/15/2020 10/07/2017 11/19/2016 09/02/2016 05/19/2016  Does Patient Have a Medical Advance Directive? No No No No No No  Would patient like information on creating a medical advance directive? No - Patient declined No - Patient declined No - Patient declined No - Patient declined - No - patient declined information    Current Medications (verified) Outpatient Encounter Medications as of 10/18/2020  Medication Sig  . acetaminophen (TYLENOL) 500 MG tablet Take 500-1,000 mg by mouth every 6 (six) hours as needed (for pain).  .Marland Kitchenallopurinol (ZYLOPRIM) 300 MG tablet Take 1 tablet (300 mg total) by mouth daily.  .Marland Kitchenatenolol (TENORMIN) 25 MG tablet Take 1 tablet (25 mg total) by mouth daily.  . budesonide (ENTOCORT EC) 3 MG 24 hr capsule Take 3 capsules (9 mg total) by mouth daily.  . chlorthalidone (HYGROTON) 25 MG tablet Take 0.5 tablets (12.5 mg total) by mouth daily.  . finasteride (PROSCAR) 5 MG tablet Take 1 tablet (5 mg total) by mouth daily.  .Marland Kitchenlisinopril (ZESTRIL) 5 MG tablet Take 1 tablet (5 mg total) by mouth daily.  . Multiple Vitamins-Minerals (PRESERVISION  AREDS 2) CAPS Take 1 capsule by mouth 2 (two) times daily.  . simvastatin (ZOCOR) 10 MG tablet Take 1 tablet (10 mg total) by mouth at bedtime.   No facility-administered encounter medications on file as of 10/18/2020.    Allergies (verified) Patient has no known allergies.   History: Past Medical History:  Diagnosis Date  . Allergy   . Anemia, iron deficiency   . C. difficile diarrhea   . 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 Cataract    bil cataracts removed  . DDD (degenerative disc disease), cervical   . Dysplastic nevus of left upper extremity   . Elevated PSA   . Essential hypertension   . Extrinsic asthma   . GERD (gastroesophageal reflux disease)    no recent  . Gout   . Hiatal hernia   . Macular degeneration   . Neuromuscular disorder ([place]    hiatal hernia  . Pneumonia    2014ish  . Spinal stenosis    Past Surgical History:  Procedure Laterality Date  . BACK SURGERY    . Bone Spur Right    Shoulder  . COLONOSCOPY    . COLONOSCOPY W/ POLYPECTOMY    . COLONOSCOPY WITH PROPOFOL N/A 11/19/2016   Procedure: COLONOSCOPY WITH PROPOFOL;  Surgeon: HDoran Stabler MD;  [facility]  Service: Gastroenterology;  Laterality: N/A;  . FECAL TRANSPLANT N/A 11/19/2016   Procedure: FECAL TRANSPLANT;  Surgeon: HDoran Stabler MD;  [facility]  Service:  Gastroenterology;  Laterality: N/A;  . MOUTH SURGERY     2 teeth removed resulting in a dry socket in one of them  . POLYPECTOMY    . SKIN CANCER EXCISION     Family History  Problem Relation Age of Onset  . Multiple myeloma Brother   . Prostate cancer Brother   . Hypertension Father        ?  . Lung cancer Sister   . Colon cancer Neg Hx   . Stomach cancer Neg Hx   . Esophageal cancer Neg Hx   . Rectal cancer Neg Hx    Social History   Socioeconomic History  . Marital status: Married    Spouse name: Not on file  . Number of children: 3  . Years of education: Not on file  . Highest  education level: Not on file  Occupational History  . Occupation: retired  Tobacco Use  . Smoking status: Former Smoker    Years: 35.00    Types: Cigarettes  . Smokeless tobacco: Never Used  . Tobacco comment: quit late 1980's  Vaping Use  . Vaping Use: Never used  Substance and Sexual Activity  . Alcohol use: No  . Drug use: No  . Sexual activity: Not on file  Other Topics Concern  . Not on file  Social History Narrative   Retired from [employer], educational    Married for 55 years    Three children all live locally    [place] to play golf and go to [employer].    Social Determinants of Health   Financial Resource Strain: Low Risk   . Difficulty of Paying Living Expenses: Not hard at all  Food Insecurity: Not on file  Transportation Needs: No Transportation Needs  . Lack of Transportation (Medical): No  . Lack of Transportation (Non-Medical): No  Physical Activity: Inactive  . Days of Exercise per Week: 0 days  . Minutes of Exercise per Session: 0 min  Stress: No Stress Concern Present  . Feeling of Stress : Not at all  Social Connections: Moderately Integrated  . Frequency of Communication with Friends and Family: Twice a week  . Frequency of Social Gatherings with Friends and Family: Once a week  . Attends Religious Services: More than 4 times per year  . Active Member of Clubs or Organizations: No  . Attends Archivist Meetings: Never  . Marital Status: Married    Tobacco Counseling Counseling given: Not Answered Comment: quit late 1980's   Clinical Intake:  Pre-visit preparation completed: Yes  Pain : No/denies pain     Nutritional Risks: Nausea/ vomitting/ diarrhea (due to c.diff) Diabetes: No  How often do you need to have someone help you when you read instructions, pamphlets, or other written materials from your doctor or pharmacy?: 3 - Sometimes (due to vision) What is the last grade level you completed in school?: Some College  Diabetic?No    Interpreter Needed?: No  Information entered by :: [place] of Daily Living In your present state of health, do you have any difficulty performing the following activities: 10/18/2020  Hearing? Y  Comment Patient wears bilateral hearing aids  Vision? Y  Comment Has macular degeneration  Difficulty concentrating or making decisions? N  Walking or climbing stairs? N  Dressing or bathing? N  Doing errands, shopping? N  Preparing Food and eating ? N  Using the Toilet? N  In the past six months, have you accidently leaked urine? N  Do you have problems with loss of bowel control? N  Managing your Medications? N  Managing your Finances? N  Housekeeping or managing your Housekeeping? N  Some recent data might be hidden    Patient Care Team: Dorothyann Peng, NP as PCP - General (Family Medicine) Viona Gilmore, [facility] as Pharmacist (Pharmacist)  Indicate any recent Medical Services you may have received from other than [facility] providers in the past year (date may be approximate).     Assessment:   This is a routine wellness examination for [place].  Hearing/Vision screen  Hearing Screening   '125Hz'  '250Hz'  '500Hz'  '1000Hz'  '2000Hz'  '3000Hz'  '4000Hz'  '6000Hz'  '8000Hz'   Right ear:           Left ear:           Vision Screening Comments: Patient states gets examined 3 times per year. Due to macular degeneration   Dietary issues and exercise activities discussed: Current Exercise Habits: The patient does not participate in regular exercise at present  Goals    . Exercise 3x per week (30 min per time)    . Pharmacy Care Plan     CARE PLAN ENTRY (see longitudinal plan of care for additional care plan information)  Current Barriers:  . Chronic Disease Management support, education, and care coordination needs related to Hypertension, GERD, BPH, Gout, and colitis and pain   Hypertension BP Readings from Last 3 Encounters:  03/09/20 122/62  03/07/20 (!) 144/88  02/22/20 (!) 152/90    . Pharmacist Clinical Goal(s): o Over the next 30 days, patient will work with PharmD and providers to maintain BP goal <140/90 . Current regimen:  . Atenolol 25 mg 1 tablet daily . Chlorthalidone 25 mg 1/2 tablet daily . Lisinopril 5 mg 1 tablet daily . Interventions: o Discussed DASH eating plan recommendations: . Emphasizes vegetables, fruits, and whole-grains . Includes fat-free or low-fat dairy products, fish, poultry, beans, nuts, and vegetable oils . Limits foods that are high in saturated fat. These foods include fatty meats, full-fat dairy products, and tropical oils such as coconut, palm kernel, and palm oils. . Limits sugar-sweetened beverages and sweets . Limiting sodium intake to < 1500 mg/day o Discussed recommendations for moderate aerobic exercise for 150 minutes/week spread out over 5 days for heart healthy lifestyle . Patient self care activities - Over the next 30 days, patient will: o Check blood pressure a few times weekly, document, and provide at future appointments o Ensure daily salt intake < 2300 mg/day o Look into trying yoga to improve balance and add exercise to his day  Gout Lab Results  Component Value Date   LABURIC 8.8 (H) 10/15/2012   . Pharmacist Clinical Goal(s): o Over the next 90 days, patient will work with PharmD and providers to achieve uric acid < 6 and avoid gout flare ups . Current regimen:  o Allopurinol 300 mg 1 tablet daily . Interventions: o Discussed Avoiding/limiting foods with high purine content: . wild game, such as veal, venison, and duck . red meat . some seafood, including tuna, sardines, anchovies, herring, mussels, codfish, scallops, trout, and haddock . organ meat, such as liver, kidneys, and thymus glands, which are known as sweetbreads o Avoiding/limiting foods to allow the body to process purines more effectively: . High-fat foods: Fat holds uric acid in the kidneys, so a person should avoid fried foods, full-fat  dairy products, rich desserts, and other high-fat items. . Alcohol: Beer and whiskey are high in purines, but some research shows  that all alcohol consumption can raise uric acid levels. Alcohol also causes dehydration, which hampers the body's ability to flush out uric acid. . Sweetened beverages: Fructose is an ingredient in many sweetened beverages, including fruit juices and sodas, and consuming too much puts a person at risk for gout . Patient self care activities - Over the next 90 days, patient will: o Continue current medication  GERD/Hiatal hernia . Pharmacist Clinical Goal(s): o Over the next 90 days, patient will work with PharmD and providers to avoid symptoms of heartburn or indigestion . Current regimen:  o Omeprazole 40 mg 1 capsule three times a week (Mon, Wed, Fri) . Interventions: o Discussed risks of long term use of PPIs including risk for C. Diff infection . Patient self care activities - Over the next 90 days, patient will: o Stop taking omeprazole as recommended by Dr. Loletha Carrow  BPH . Pharmacist Clinical Goal(s): o Over the next 90 days, patient will work with PharmD and providers to manage symptoms of enlarged prostate . Current regimen:  o Dutasteride 0.5 mg 1 capsule daily . Interventions: o Discussed cost of medication and lower cost alternative finasteride . Patient self care activities - Over the next 90 days, patient will: o Once finished with on hand supply of dutasteride, switch to finasteride daily  Colitis . Pharmacist Clinical Goal(s) o Over the next 90 days, patient will work with PharmD and providers to manage symptoms of colitis . Current regimen:  o Budesonide 3 mg 3 capsules daily . Patient self care activities - Over the next 90 days, patient will: o Continue current medication  Pain . Pharmacist Clinical Goal(s) o Over the next 90 days, patient will work with PharmD and providers to control pain . Current regimen:  . Acetaminophen 500 mg 1  tablet as needed . Ibuprofen 200 mg 1 tablet as needed . Interventions: o Discussed the risks of using NSAIDs such as ibuprofen on kidneys, especially with chronic use o Discussed the recommendation for the maximum daily dose of Tylenol of 3,000 mg/day . Patient self care activities - Over the next 90 days, patient will: o Try to use Tylenol (acetaminophen) more frequently if needed for pain   Medication management . Pharmacist Clinical Goal(s): o Over the next 90 days, patient will work with PharmD and providers to maintain optimal medication adherence . Current pharmacy: [facility] . Interventions o Comprehensive medication review performed. o Utilize [facility] pharmacy for medication synchronization, packaging and delivery . Patient self care activities - Over the next 90 days, patient will: o Take medications as prescribed o Report any questions or concerns to PharmD and/or provider(s)  Initial goal documentation       Depression Screen PHQ 2/9 Scores 10/18/2020 10/05/2020 09/21/2019 08/11/2019 06/30/2019 09/17/2018 06/09/2018  PHQ - 2 Score 0 0 0 0 0 0 0  PHQ- 9 Score 0 - - - - - -    Fall Risk Fall Risk  10/18/2020 10/05/2020 09/21/2019 08/11/2019 06/30/2019  Falls in the past year? 1 1 0 0 0  Number falls in past yr: 0 0 0 - 0  Injury with Fall? 1 - 0 - 0  Comment cut arm - - - -  Risk for fall due to : Impaired balance/gait - - - -  Follow up Falls evaluation completed;Falls prevention discussed - - Falls evaluation completed -    FALL RISK PREVENTION PERTAINING TO THE HOME:  Any stairs in or around the home? Yes  If so, are there any  without handrails? No  Home free of loose throw rugs in walkways, pet beds, electrical cords, etc? Yes  Adequate lighting in your home to reduce risk of falls? Yes   ASSISTIVE DEVICES UTILIZED TO PREVENT FALLS:  Life alert? No  Use of a cane, walker or w/c? No  Grab bars in the bathroom? Yes  Shower chair or bench in shower? No  Elevated  toilet seat or a handicapped toilet? No     Cognitive Function: Normal cognitive status assessed by direct observation by this Nurse Health Advisor. No abnormalities found.          Immunizations Immunization History  Administered Date(s) Administered  . Fluad Quad(high Dose 65+) 06/16/2019, 08/10/2020  . Influenza Split 08/13/2011, 09/16/2012  . Influenza Whole 07/31/2007, 06/30/2008, 07/14/2009  . Influenza, High Dose Seasonal PF 08/12/2013, 08/03/2014, 07/19/2015, 06/18/2016, 07/29/2017, 06/09/2018  . PFIZER(Purple Top)SARS-COV-2 Vaccination 10/27/2019, 11/17/2019, 10/10/2020  . Pneumococcal Conjugate-13 02/16/2014  . Pneumococcal Polysaccharide-23 10/01/1999, 08/23/2009  . Td 10/01/2003  . Tetanus 02/16/2014  . Zoster 10/16/2012    TDAP status: Up to date  Flu Vaccine status: Up to date  Pneumococcal vaccine status: Up to date  Covid-19 vaccine status: Completed vaccines  Qualifies for Shingles Vaccine? Yes   Zostavax completed Yes   Shingrix Completed?: No.    Education has been provided regarding the importance of this vaccine. Patient has been advised to call insurance company to determine out of pocket expense if they have not yet received this vaccine. Advised may also receive vaccine at local pharmacy or Health Dept. Verbalized acceptance and understanding.  Screening Tests Health Maintenance  Topic Date Due  . TETANUS/TDAP  02/17/2024  . INFLUENZA VACCINE  Completed  . COVID-19 Vaccine  Completed  . PNA vac Low Risk Adult  Completed    Health Maintenance  There are no preventive care reminders to display for this patient.  Colorectal cancer screening: No longer required.   Lung Cancer Screening: (Low Dose CT Chest recommended if Age 34-80 years, 30 pack-year currently smoking OR have quit w/in 15years.) does not qualify.   Lung Cancer Screening Referral: N/A   Additional Screening:  Hepatitis C Screening: does not qualify;   Vision Screening:  Recommended annual ophthalmology exams for early detection of glaucoma and other disorders of the eye. Is the patient up to date with their annual eye exam?  Yes  Who is the provider or what is the name of the office in which the patient attends annual eye exams? Dr. Zadie Rhine  If pt is not established with a provider, would they like to be referred to a provider to establish care? No .   Dental Screening: Recommended annual dental exams for proper oral hygiene  Community Resource Referral / Chronic Care Management: CRR required this visit?  No   CCM required this visit?  No      Plan:     I have personally reviewed and noted the following in the patient's chart:   . Medical and social history . Use of alcohol, tobacco or illicit drugs  . Current medications and supplements . Functional ability and status . Nutritional status . Physical activity . Advanced directives . List of other physicians . Hospitalizations, surgeries, and ER visits in previous 12 months . Vitals . Screenings to include cognitive, depression, and falls . Referrals and appointments  In addition, I have reviewed and discussed with patient certain preventive protocols, quality metrics, and best practice recommendations. A written personalized care plan for preventive  services as well as general preventive health recommendations were provided to patient.     Ofilia Neas, LPN   [id number]   Nurse Notes: None

## 2020-10-18 NOTE — Telephone Encounter (Signed)
Informed the pt of the message from [facility]. I have removed ibuprofen from the chart.  Nothing further is needed.

## 2020-10-25 DIAGNOSIS — Z0100 Encounter for examination of eyes and vision without abnormal findings: Secondary | ICD-10-CM | POA: Diagnosis not present

## 2020-10-27 ENCOUNTER — Other Ambulatory Visit: Payer: Self-pay | Admitting: Nephrology

## 2020-10-27 DIAGNOSIS — I129 Hypertensive chronic kidney disease with stage 1 through stage 4 chronic kidney disease, or unspecified chronic kidney disease: Secondary | ICD-10-CM | POA: Diagnosis not present

## 2020-10-27 DIAGNOSIS — N184 Chronic kidney disease, stage 4 (severe): Secondary | ICD-10-CM

## 2020-10-27 DIAGNOSIS — M1000 Idiopathic gout, unspecified site: Secondary | ICD-10-CM | POA: Diagnosis not present

## 2020-10-27 LAB — BASIC METABOLIC PANEL
CO2: 26 — AB (ref 13–22)
Chloride: 106 (ref 99–108)
Creatinine: 1.7 — AB (ref 0.6–1.3)
Glucose: 100
Potassium: 4.1 (ref 3.4–5.3)
Sodium: 140 (ref 137–147)

## 2020-10-27 LAB — COMPREHENSIVE METABOLIC PANEL
Albumin: 4.1 (ref 3.5–5.0)
Calcium: 9.7 (ref 8.7–10.7)
GFR calc Af Amer: 40
GFR calc non Af Amer: 35

## 2020-10-27 LAB — CBC AND DIFFERENTIAL: Hemoglobin: 13.1 — AB (ref 13.5–17.5)

## 2020-11-02 ENCOUNTER — Telehealth: Payer: Self-pay | Admitting: Pharmacist

## 2020-11-02 NOTE — Chronic Care Management (AMB) (Signed)
Chronic Care Management Pharmacy Assistant   Name: Shane [name]  MRN: [id number] DOB: 01-Mar-1932  Reason for Encounter: Medication Review  PCP : Dorothyann Peng, NP  Allergies:  No Known Allergies  Medications: Outpatient Encounter Medications as of 11/02/2020  Medication Sig  . acetaminophen (TYLENOL) 500 MG tablet Take 500-1,000 mg by mouth every 6 (six) hours as needed (for pain).  Marland Kitchen allopurinol (ZYLOPRIM) 300 MG tablet Take 1 tablet (300 mg total) by mouth daily.  Marland Kitchen atenolol (TENORMIN) 25 MG tablet Take 1 tablet (25 mg total) by mouth daily.  . budesonide (ENTOCORT EC) 3 MG 24 hr capsule Take 3 capsules (9 mg total) by mouth daily.  . chlorthalidone (HYGROTON) 25 MG tablet Take 0.5 tablets (12.5 mg total) by mouth daily.  . finasteride (PROSCAR) 5 MG tablet Take 1 tablet (5 mg total) by mouth daily.  Marland Kitchen lisinopril (ZESTRIL) 5 MG tablet Take 1 tablet (5 mg total) by mouth daily.  . Multiple Vitamins-Minerals (PRESERVISION AREDS 2) CAPS Take 1 capsule by mouth 2 (two) times daily.  . simvastatin (ZOCOR) 10 MG tablet Take 1 tablet (10 mg total) by mouth at bedtime.   No facility-administered encounter medications on file as of 11/02/2020.    Current Diagnosis: Patient Active Problem List   Diagnosis Date Noted  . Advanced nonexudative age-related macular degeneration of left eye with subfoveal involvement 06/19/2020  . Left epiretinal membrane 06/19/2020  . Advanced nonexudative age-related macular degeneration of right eye with subfoveal involvement 06/19/2020  . Exudative age-related macular degeneration of right eye with inactive choroidal neovascularization ([place]) 06/19/2020  . Chronic kidney disease (CKD), stage III (moderate) ([place]) 06/16/2019  . Clostridium difficile infection   . Hypertriglyceridemia 12/01/2015  . EXTRINSIC ASTHMA, UNSPECIFIED 01/11/2009  . SPINAL STENOSIS 05/24/2008  . PROSTATE SPECIFIC ANTIGEN, ELEVATED 05/24/2008  . OBESITY 08/13/2006  . ANEMIA-IRON  DEFICIENCY 08/13/2006  . Essential hypertension 08/13/2006  . ALLERGIC RHINITIS 08/13/2006  . GERD 08/13/2006  . HIATAL HERNIA 08/13/2006  . DEGENERATIVE JOINT DISEASE 08/13/2006    Goals Addressed   None    Reviewed chart for medication changes ahead of medication coordination call. No OVs, Consults, or hospital visits since last care coordination call/Pharmacist visit.  No medication changes indicated BP Readings from Last 3 Encounters:  10/05/20 130/86  03/09/20 122/62  03/07/20 (!) 144/88    No results found for: HGBA1C   Patient obtains medications through Adherence Packaging  90 Days  Last adherence delivery included:  . Lisinopril (ZESTRIL) 5 mg: one tablet at breakfast . Allopurinol (ZYLOPRIM) 300 mg: one tablet at breakfast . Atenolol (TENORMIN) 25 mg: one tablet at breakfast . Chlorthalidone (HYGROTON) 25 mg:   (12.5 mg) at breakfast . Simvastatin (ZOCOR) 10 mg: one at bedtime  . Finasteride (PROSCAR) 5 mg: one tablet at bedtime  Patient declined the following medication last month due to additional supply on hand. . Budesonide (ENTOCORT EC) 3 MG 24 hr capsule  I spoke with the patient and review medications. There are no changes in medications currently. Patient declined these medications this month due to PRN use/additional supply on hand.The patient is taking the following medications: . Lisinopril (ZESTRIL) 5 mg: one tablet at breakfast . Allopurinol (ZYLOPRIM) 300 mg: one tablet at breakfast . Atenolol (TENORMIN) 25 mg: one tablet at breakfast . Chlorthalidone (HYGROTON) 25 mg:   (12.5 mg) at breakfast . Simvastatin (ZOCOR) 10 mg: one at bedtime  . Finasteride (PROSCAR) 5 mg: one tablet at bedtime . Budesonide (ENTOCORT  EC) 3 MG 24 hr capsule  The patient currently does not need refills. Confirmed delivery date of 01/09/2021, advised patient that pharmacy will contact them the morning of delivery.   Follow-Up:  Pharmacist Review  Maia Breslow,  [place] Assistant [phone]

## 2020-11-03 ENCOUNTER — Encounter: Payer: Self-pay | Admitting: Family Medicine

## 2020-11-06 ENCOUNTER — Telehealth: Payer: Self-pay | Admitting: Internal Medicine

## 2020-11-06 NOTE — Telephone Encounter (Signed)
Received a new hem referral from [facility] for abnormal spep. Pt has been cld and scheduled to see Dr. Julien Nordmann on 2/17at 11:30am w/labs at 11am. Pt aware to arrive 15 minutes early.

## 2020-11-08 ENCOUNTER — Ambulatory Visit
Admission: RE | Admit: 2020-11-08 | Discharge: 2020-11-08 | Disposition: A | Discharge status: Home / Self Care | Payer: Medicare HMO | Source: Ambulatory Visit | Attending: Nephrology | Admitting: Nephrology

## 2020-11-08 DIAGNOSIS — N400 Enlarged prostate without lower urinary tract symptoms: Secondary | ICD-10-CM | POA: Diagnosis not present

## 2020-11-08 DIAGNOSIS — N184 Chronic kidney disease, stage 4 (severe): Secondary | ICD-10-CM

## 2020-11-08 DIAGNOSIS — N281 Cyst of kidney, acquired: Secondary | ICD-10-CM | POA: Diagnosis not present

## 2020-11-10 ENCOUNTER — Other Ambulatory Visit: Payer: Self-pay | Admitting: Gastroenterology

## 2020-11-14 ENCOUNTER — Other Ambulatory Visit: Payer: Self-pay | Admitting: Internal Medicine

## 2020-11-14 DIAGNOSIS — D472 Monoclonal gammopathy: Secondary | ICD-10-CM

## 2020-11-16 ENCOUNTER — Other Ambulatory Visit: Payer: Self-pay | Admitting: Internal Medicine

## 2020-11-16 ENCOUNTER — Inpatient Hospital Stay: Payer: Medicare HMO | Attending: Internal Medicine | Admitting: Internal Medicine

## 2020-11-16 ENCOUNTER — Other Ambulatory Visit: Payer: Self-pay

## 2020-11-16 ENCOUNTER — Inpatient Hospital Stay: Payer: Medicare HMO

## 2020-11-16 VITALS — BP 171/63 | HR 66 | Temp 97.8°F | Resp 15 | Ht 70.0 in | Wt 201.1 lb

## 2020-11-16 DIAGNOSIS — Z8042 Family history of malignant neoplasm of prostate: Secondary | ICD-10-CM | POA: Diagnosis not present

## 2020-11-16 DIAGNOSIS — M4800 Spinal stenosis, site unspecified: Secondary | ICD-10-CM

## 2020-11-16 DIAGNOSIS — N184 Chronic kidney disease, stage 4 (severe): Secondary | ICD-10-CM | POA: Insufficient documentation

## 2020-11-16 DIAGNOSIS — G8929 Other chronic pain: Secondary | ICD-10-CM | POA: Diagnosis not present

## 2020-11-16 DIAGNOSIS — K219 Gastro-esophageal reflux disease without esophagitis: Secondary | ICD-10-CM | POA: Diagnosis not present

## 2020-11-16 DIAGNOSIS — D472 Monoclonal gammopathy: Secondary | ICD-10-CM

## 2020-11-16 DIAGNOSIS — Z7952 Long term (current) use of systemic steroids: Secondary | ICD-10-CM | POA: Insufficient documentation

## 2020-11-16 DIAGNOSIS — Z85828 Personal history of other malignant neoplasm of skin: Secondary | ICD-10-CM | POA: Insufficient documentation

## 2020-11-16 DIAGNOSIS — Z87891 Personal history of nicotine dependence: Secondary | ICD-10-CM | POA: Insufficient documentation

## 2020-11-16 DIAGNOSIS — Z8249 Family history of ischemic heart disease and other diseases of the circulatory system: Secondary | ICD-10-CM | POA: Diagnosis not present

## 2020-11-16 DIAGNOSIS — H3530 Unspecified macular degeneration: Secondary | ICD-10-CM

## 2020-11-16 DIAGNOSIS — Z801 Family history of malignant neoplasm of trachea, bronchus and lung: Secondary | ICD-10-CM | POA: Diagnosis not present

## 2020-11-16 DIAGNOSIS — Z79899 Other long term (current) drug therapy: Secondary | ICD-10-CM | POA: Insufficient documentation

## 2020-11-16 DIAGNOSIS — I10 Essential (primary) hypertension: Secondary | ICD-10-CM

## 2020-11-16 DIAGNOSIS — Z86018 Personal history of other benign neoplasm: Secondary | ICD-10-CM | POA: Insufficient documentation

## 2020-11-16 DIAGNOSIS — I129 Hypertensive chronic kidney disease with stage 1 through stage 4 chronic kidney disease, or unspecified chronic kidney disease: Secondary | ICD-10-CM | POA: Insufficient documentation

## 2020-11-16 DIAGNOSIS — Z808 Family history of malignant neoplasm of other organs or systems: Secondary | ICD-10-CM | POA: Insufficient documentation

## 2020-11-16 DIAGNOSIS — M549 Dorsalgia, unspecified: Secondary | ICD-10-CM

## 2020-11-16 DIAGNOSIS — R338 Other retention of urine: Secondary | ICD-10-CM | POA: Diagnosis not present

## 2020-11-16 LAB — CBC WITH DIFFERENTIAL (CANCER CENTER ONLY)
Abs Immature Granulocytes: 0.04 10*3/uL (ref 0.00–0.07)
Basophils Absolute: 0 10*3/uL (ref 0.0–0.1)
Basophils Relative: 0 %
Eosinophils Absolute: 0.5 10*3/uL (ref 0.0–0.5)
Eosinophils Relative: 5 %
HCT: 40.6 % (ref 39.0–52.0)
Hemoglobin: 13.2 g/dL (ref 13.0–17.0)
Immature Granulocytes: 0 %
Lymphocytes Relative: 18 %
Lymphs Abs: 1.9 10*3/uL (ref 0.7–4.0)
MCH: 31.1 pg (ref 26.0–34.0)
MCHC: 32.5 g/dL (ref 30.0–36.0)
MCV: 95.5 fL (ref 80.0–100.0)
Monocytes Absolute: 0.8 10*3/uL (ref 0.1–1.0)
Monocytes Relative: 8 %
Neutro Abs: 7 10*3/uL (ref 1.7–7.7)
Neutrophils Relative %: 69 %
Platelet Count: 169 10*3/uL (ref 150–400)
RBC: 4.25 MIL/uL (ref 4.22–5.81)
RDW: 14.3 % (ref 11.5–15.5)
WBC Count: 10.3 10*3/uL (ref 4.0–10.5)
nRBC: 0 % (ref 0.0–0.2)

## 2020-11-16 LAB — CMP (CANCER CENTER ONLY)
ALT: 11 U/L (ref 0–44)
AST: 20 U/L (ref 15–41)
Albumin: 3.6 g/dL (ref 3.5–5.0)
Alkaline Phosphatase: 24 U/L — ABNORMAL LOW (ref 38–126)
Anion gap: 8 (ref 5–15)
BUN: 43 mg/dL — ABNORMAL HIGH (ref 8–23)
CO2: 22 mmol/L (ref 22–32)
Calcium: 9.1 mg/dL (ref 8.9–10.3)
Chloride: 109 mmol/L (ref 98–111)
Creatinine: 2.04 mg/dL — ABNORMAL HIGH (ref 0.61–1.24)
GFR, Estimated: 31 mL/min — ABNORMAL LOW (ref >60)
Glucose, Bld: 94 mg/dL (ref 70–99)
Potassium: 4.2 mmol/L (ref 3.5–5.1)
Sodium: 139 mmol/L (ref 135–145)
Total Bilirubin: 0.8 mg/dL (ref 0.3–1.2)
Total Protein: 6.7 g/dL (ref 6.5–8.1)

## 2020-11-16 LAB — LACTATE DEHYDROGENASE: LDH: 226 U/L — ABNORMAL HIGH (ref 98–192)

## 2020-11-16 NOTE — Progress Notes (Signed)
[facility] [phone]   [phone]  CONSULT NOTE  REFERRING PHYSICIAN: Dr. Corliss Parish  REASON FOR CONSULTATION:  87 years old white male with monoclonal gammopathy.  HPI Shane [name] is a 87 y.o. male with past medical history significant for hypertension, GERD, squamous cell carcinoma of the skin, history of iron deficiency anemia, C. difficile, macular degeneration, spinal stenosis as well as benign prostatic hypertrophy and chronic back pain.  The patient was seen by his primary care provider and was noticed to have worsening serum creatinine over the last few years.  He was referred to Dr. Moshe Cipro for evaluation.  She order several studies including serum protein electrophoresis with immunofixation and it showed M spike of 0.5.  The free kappa light chain was 78.8 and free lambda light chain was 193.7 with a ratio of 0.41.  She referred the patient to me today for further evaluation and recommendation regarding this abnormality. When seen today the patient is feeling fine with no concerning complaints except for easy bruising and he is not on any anticoagulation.  He denied having any current chest pain, shortness of breath, cough or hemoptysis.  He denied having any nausea, vomiting, diarrhea or constipation.  He denied having any headache or visual changes. Family history significant for a brother who was diagnosed with multiple myeloma.  Another brother had prostate cancer.  His sister had lung cancer.  His mother died from old age at age 71 and father had prostate cancer and died at age 63. The patient is married and has 3 children.  He used to work for [employer].  He was accompanied today by his daughter Shane [name].  The patient has a history of smoking more than 1 pack/day for around 35 years and quit 30 years ago.  He has no history of alcohol or drug abuse.  HPI  Past Medical History:  Diagnosis Date  . Allergy   . Anemia, iron  deficiency   . C. difficile diarrhea   . 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 Cataract    bil cataracts removed  . DDD (degenerative disc disease), cervical   . Dysplastic nevus of left upper extremity   . Elevated PSA   . Essential hypertension   . Extrinsic asthma   . GERD (gastroesophageal reflux disease)    no recent  . Gout   . Hiatal hernia   . Macular degeneration   . Neuromuscular disorder ([place])    hiatal hernia  . Pneumonia    2014ish  . Spinal stenosis     Past Surgical History:  Procedure Laterality Date  . BACK SURGERY    . Bone Spur Right    Shoulder  . COLONOSCOPY    . COLONOSCOPY W/ POLYPECTOMY    . COLONOSCOPY WITH PROPOFOL N/A 11/19/2016   Procedure: COLONOSCOPY WITH PROPOFOL;  Surgeon: Doran Stabler, MD;  Location: [facility];  Service: Gastroenterology;  Laterality: N/A;  . FECAL TRANSPLANT N/A 11/19/2016   Procedure: FECAL TRANSPLANT;  Surgeon: Doran Stabler, MD;  Location: [facility];  Service: Gastroenterology;  Laterality: N/A;  . MOUTH SURGERY     2 teeth removed resulting in a dry socket in one of them  . POLYPECTOMY    . SKIN CANCER EXCISION      Family History  Problem Relation Age of Onset  . Multiple myeloma Brother   . Prostate cancer Brother   . Hypertension Father        ?  Marland Kitchen  Lung cancer Sister   . Colon cancer Neg Hx   . Stomach cancer Neg Hx   . Esophageal cancer Neg Hx   . Rectal cancer Neg Hx     Social History Social History   Tobacco Use  . Smoking status: Former Smoker    Years: 35.00    Types: Cigarettes  . Smokeless tobacco: Never Used  . Tobacco comment: quit late 1980's  Vaping Use  . Vaping Use: Never used  Substance Use Topics  . Alcohol use: No  . Drug use: No    No Known Allergies  Current Outpatient Medications  Medication Sig Dispense Refill  . acetaminophen (TYLENOL) 500 MG tablet Take 500-1,000 mg by mouth every 6 (six) hours as needed (for pain).    Marland Kitchen allopurinol (ZYLOPRIM) 300 MG  tablet Take 1 tablet (300 mg total) by mouth daily. 90 tablet 3  . atenolol (TENORMIN) 25 MG tablet Take 1 tablet (25 mg total) by mouth daily. 90 tablet 3  . budesonide (ENTOCORT EC) 3 MG 24 hr capsule TAKE 3 CAPSULES (9 MG TOTAL) BY MOUTH DAILY. 270 capsule 1  . chlorthalidone (HYGROTON) 25 MG tablet Take 0.5 tablets (12.5 mg total) by mouth daily. 45 tablet 3  . finasteride (PROSCAR) 5 MG tablet Take 1 tablet (5 mg total) by mouth daily. 90 tablet 3  . lisinopril (ZESTRIL) 5 MG tablet Take 1 tablet (5 mg total) by mouth daily. 90 tablet 3  . Multiple Vitamins-Minerals (PRESERVISION AREDS 2) CAPS Take 1 capsule by mouth 2 (two) times daily.    . simvastatin (ZOCOR) 10 MG tablet Take 1 tablet (10 mg total) by mouth at bedtime. 90 tablet 3   No current facility-administered medications for this visit.    Review of Systems  Constitutional: positive for fatigue Eyes: negative Ears, nose, mouth, throat, and face: negative Respiratory: negative Cardiovascular: negative Gastrointestinal: negative Genitourinary:negative Integument/breast: negative Hematologic/lymphatic: negative Musculoskeletal:negative Neurological: negative Behavioral/Psych: negative Endocrine: negative Allergic/Immunologic: negative  Physical Exam  BDZ:HGDJM, healthy, no distress, well nourished and well developed SKIN: skin color, texture, turgor are normal, no rashes or significant lesions HEAD: Normocephalic, No masses, lesions, tenderness or abnormalities EYES: normal, PERRLA, Conjunctiva are pink and non-injected EARS: External ears normal, Canals clear OROPHARYNX:no exudate, no erythema and lips, buccal mucosa, and tongue normal  NECK: supple, no adenopathy, no JVD LYMPH:  no palpable lymphadenopathy, no hepatosplenomegaly LUNGS: clear to auscultation , and palpation HEART: regular rate & rhythm, no murmurs and no gallops ABDOMEN:abdomen soft, non-tender, normal bowel sounds and no masses or  organomegaly BACK: No CVA tenderness, Range of motion is normal EXTREMITIES:no joint deformities, effusion, or inflammation, no edema  NEURO: alert & oriented x 3 with fluent speech, no focal motor/sensory deficits  PERFORMANCE STATUS: ECOG 1  LABORATORY DATA: Lab Results  Component Value Date   WBC 10.3 11/16/2020   HGB 13.2 11/16/2020   HCT 40.6 11/16/2020   MCV 95.5 11/16/2020   PLT 169 11/16/2020      Chemistry      Component Value Date/Time   NA 140 10/27/2020 0000   K 4.1 10/27/2020 0000   CL 106 10/27/2020 0000   CO2 26 (A) 10/27/2020 0000   BUN 50 (H) 10/05/2020 0932   CREATININE 1.7 (A) 10/27/2020 0000   CREATININE 2.05 (H) 10/05/2020 0932   GLU 100 10/27/2020 0000      Component Value Date/Time   CALCIUM 9.7 10/27/2020 0000   ALKPHOS 26 (L) 10/05/2020 0932   AST 18  10/05/2020 0932   ALT 11 10/05/2020 0932   BILITOT 0.7 10/05/2020 0932       RADIOGRAPHIC STUDIES: US RENAL  Result Date: 11/08/2020 CLINICAL DATA:  Chronic kidney disease stage 4 EXAM: RENAL / URINARY TRACT ULTRASOUND COMPLETE COMPARISON:  CT 04/26/2016 FINDINGS: Right Kidney: Renal measurements: 7.8 x 4.1 x 4.5 cm = volume: 75 mL. Visualization of the right kidney is slightly limited, however, there has been interval progressive cortical volume loss. Mild renal cortical hyperechogenicity is present in keeping with changes of underlying medical renal disease. No hydronephrosis. No solid intrarenal mass or calcification is identified. 4.4 cm simple cyst is seen within the upper pole of the right kidney. 3.3 cm simple cortical cyst is seen exophytically from the interpolar region of the right kidney. Left Kidney: Renal measurements: 10.5 x 5.1 x 5.0 cm = volume: 140 mL. Renal cortical echogenicity is mildly, diffusely increased in keeping with changes of underlying medical renal disease. Cortical thickness and overall volume is preserved in comparison to prior CT examination. No hydronephrosis. No solid  intrarenal mass or calcification identified. 2.1 cm simple cortical cyst arises exophytically from the interpolar region. Bladder: Appears normal for degree of bladder distention. Other: Moderate prostatic hypertrophy. IMPRESSION: Increased renal cortical echogenicity in keeping with changes of medical renal disease. Progressive cortical volume loss of the right kidney when compared to prior CT examination of 04/26/2016. Multiple simple cortical cysts.  Further follow-up is not required. Electronically Signed   By: Fidela Salisbury MD   On: 11/08/2020 16:25    ASSESSMENT: This is a very pleasant 87 years old white male presented with monoclonal gammopathy seen on SPEP with immunofixation as well as elevated free kappa as well as free lambda light chains.  This is suspicious for underlying multiple myeloma.   PLAN: I had a lengthy discussion with the patient and his daughter today about his current condition and further investigation to confirm his diagnosis. I order full myeloma panel today. I also recommended for the patient to have skeletal bone survey as well as bone marrow biopsy and aspirate for evaluation of the plasma cells in the bone marrow. I will arrange for the patient to come back for follow-up visit in around 2 weeks for evaluation and more detailed discussion of his treatment options based on the final lab work as well as the skeletal bone survey and bone marrow biopsy and aspirate. The patient is currently asymptomatic. For the renal insufficiency he will continue his current follow-up and evaluation by Dr. Moshe Cipro He was advised to call immediately if he has any other concerning symptoms in the interval. The patient voices understanding of current disease status and treatment options and is in agreement with the current care plan.  All questions were answered. The patient knows to call the clinic with any problems, questions or concerns. We can certainly see the patient much sooner  if necessary.  Thank you so much for allowing me to participate in the care of Shane [name]. I will continue to follow up the patient with you and assist in his care.  The total time spent in the appointment was 60 minutes.  Disclaimer: This note was dictated with voice recognition software. Similar sounding words can inadvertently be transcribed and may not be corrected upon review.   Eilleen Kempf November 16, 2020, 11:42 AM

## 2020-11-17 LAB — IGG, IGA, IGM
IgA: 206 mg/dL (ref 61–437)
IgG (Immunoglobin G), Serum: 947 mg/dL (ref 603–1613)
IgM (Immunoglobulin M), Srm: 40 mg/dL (ref 15–143)

## 2020-11-17 LAB — KAPPA/LAMBDA LIGHT CHAINS
Kappa free light chain: 64.5 mg/L — ABNORMAL HIGH (ref 3.3–19.4)
Kappa, lambda light chain ratio: 0.39 (ref 0.26–1.65)
Lambda free light chains: 163.8 mg/L — ABNORMAL HIGH (ref 5.7–26.3)

## 2020-11-17 LAB — BETA 2 MICROGLOBULIN, SERUM: Beta-2 Microglobulin: 6 mg/L — ABNORMAL HIGH (ref 0.6–2.4)

## 2020-11-21 ENCOUNTER — Ambulatory Visit (HOSPITAL_COMMUNITY)
Admission: RE | Admit: 2020-11-21 | Discharge: 2020-11-21 | Disposition: A | Discharge status: Home / Self Care | Payer: Medicare HMO | Source: Ambulatory Visit | Attending: Internal Medicine | Admitting: Internal Medicine

## 2020-11-21 ENCOUNTER — Other Ambulatory Visit: Payer: Self-pay

## 2020-11-21 DIAGNOSIS — D472 Monoclonal gammopathy: Secondary | ICD-10-CM

## 2020-11-27 ENCOUNTER — Other Ambulatory Visit: Payer: Self-pay

## 2020-11-27 MED ORDER — BUDESONIDE 3 MG PO CPEP: 9.0000 mg | ORAL_CAPSULE | Freq: Every day | ORAL | 1 refills | Status: AC

## 2020-11-30 ENCOUNTER — Telehealth: Payer: Self-pay | Admitting: Pharmacist

## 2020-11-30 NOTE — Chronic Care Management (AMB) (Signed)
Chronic Care Management Pharmacy Assistant   Name: ETHER WOLTERS  MRN: [id number] DOB: 03-27-1932  Reason for Encounter: Disease State and Medication Review  Patient Questions: 1.  Have you seen any other providers since your last visit?  o 02.17.22 Curt Bears, MD Oncology  2.  Any changes in your medicines or health? No   PCP : Dorothyann Peng, NP  Allergies:  No Known Allergies  Medications: Outpatient Encounter Medications as of 11/30/2020  Medication Sig   acetaminophen (TYLENOL) 500 MG tablet Take 500-1,000 mg by mouth every 6 (six) hours as needed (for pain).   allopurinol (ZYLOPRIM) 300 MG tablet Take 1 tablet (300 mg total) by mouth daily.   atenolol (TENORMIN) 25 MG tablet Take 1 tablet (25 mg total) by mouth daily.   budesonide (ENTOCORT EC) 3 MG 24 hr capsule Take 3 capsules (9 mg total) by mouth daily.   chlorthalidone (HYGROTON) 25 MG tablet Take 0.5 tablets (12.5 mg total) by mouth daily.   finasteride (PROSCAR) 5 MG tablet Take 1 tablet (5 mg total) by mouth daily.   lisinopril (ZESTRIL) 5 MG tablet Take 1 tablet (5 mg total) by mouth daily.   Multiple Vitamins-Minerals (PRESERVISION AREDS 2) CAPS Take 1 capsule by mouth 2 (two) times daily.   simvastatin (ZOCOR) 10 MG tablet Take 1 tablet (10 mg total) by mouth at bedtime.   No facility-administered encounter medications on file as of 11/30/2020.    Current Diagnosis: Patient Active Problem List   Diagnosis Date Noted   MGUS (monoclonal gammopathy of unknown significance) 11/16/2020   Advanced nonexudative age-related macular degeneration of left eye with subfoveal involvement 06/19/2020   Left epiretinal membrane 06/19/2020   Advanced nonexudative age-related macular degeneration of right eye with subfoveal involvement 06/19/2020   Exudative age-related macular degeneration of right eye with inactive choroidal neovascularization ([place]) 06/19/2020   Chronic kidney disease (CKD), stage III  (moderate) ([place]) 06/16/2019   Clostridium difficile infection    Hypertriglyceridemia 12/01/2015   EXTRINSIC ASTHMA, UNSPECIFIED 01/11/2009   SPINAL STENOSIS 05/24/2008   PROSTATE SPECIFIC ANTIGEN, ELEVATED 05/24/2008   OBESITY 08/13/2006   ANEMIA-IRON DEFICIENCY 08/13/2006   Essential hypertension 08/13/2006   ALLERGIC RHINITIS 08/13/2006   GERD 08/13/2006   HIATAL HERNIA 08/13/2006   DEGENERATIVE JOINT DISEASE 08/13/2006    Goals Addressed   None    Reviewed chart for medication changes ahead of medication coordination call.  BP Readings from Last 3 Encounters:  11/16/20 (!) 171/63  10/05/20 130/86  03/09/20 122/62    No results found for: HGBA1C   Patient obtains medications through Vials  90 Days  Last adherence delivery included:   Lisinopril (ZESTRIL) 5 mg: one tablet at breakfast  Allopurinol (ZYLOPRIM) 300 mg: one tablet at breakfast  Atenolol (TENORMIN) 25 mg: one tablet at breakfast  Chlorthalidone (HYGROTON) 25 mg:   (12.5 mg) at breakfast  Simvastatin (ZOCOR) 10 mg: one at bedtime   Finasteride (PROSCAR) 5 mg: one tablet at bedtime  Budesonide (ENTOCORT EC) 3 MG 24 hr: Three (3) capsule daily  I spoke with the patient and review medications. There are no changes in medications currently. Patient declined these medication this month due to PRN use/additional supply on hand.The patient is taking the following medications:  Lisinopril (ZESTRIL) 5 mg: one tablet at breakfast  Allopurinol (ZYLOPRIM) 300 mg: one tablet at breakfast  Atenolol (TENORMIN) 25 mg: one tablet at breakfast  Chlorthalidone (HYGROTON) 25 mg:   (12.5 mg) at breakfast  Simvastatin (ZOCOR)  10 mg: one at bedtime   Finasteride (PROSCAR) 5 mg: one tablet at bedtime  Budesonide (ENTOCORT EC) 3 MG 24 hr: Three (3) capsule daily  He currently does not need refills Confirmed delivery date of 01/08/2021, advised patient that pharmacy will contact them the morning of  delivery.  Follow-Up:  Coordination of Enhanced Pharmacy Services, Pharmacist Review and Scheduled Follow-Up With Clinical Pharmacist  Reviewed chart prior to disease state call. Spoke with patient regarding BP  Recent Office Vitals: BP Readings from Last 3 Encounters:  11/16/20 (!) 171/63  10/05/20 130/86  03/09/20 122/62   Pulse Readings from Last 3 Encounters:  11/16/20 66  03/09/20 65  02/15/20 97    Wt Readings from Last 3 Encounters:  11/16/20 201 lb 1.6 oz (91.2 kg)  10/05/20 199 lb (90.3 kg)  03/09/20 193 lb (87.5 kg)     Kidney Function Lab Results  Component Value Date/Time   CREATININE 2.04 (H) 11/16/2020 11:24 AM   CREATININE 1.7 (A) 10/27/2020 12:00 AM   CREATININE 2.05 (H) 10/05/2020 09:32 AM   CREATININE 1.80 (H) 06/29/2019 10:08 AM   GFR 28.84 (L) 10/05/2020 09:32 AM   GFRNONAA 31 (L) 11/16/2020 11:24 AM   GFRAA 40 10/27/2020 12:00 AM    BMP Latest Ref Rng & Units 11/16/2020 10/27/2020 10/05/2020  Glucose 70 - 99 mg/dL 94 - 105(H)  BUN 8 - 23 mg/dL 43(H) - 50(H)  Creatinine 0.61 - 1.24 mg/dL 2.04(H) 1.7(A) 2.05(H)  Sodium 135 - 145 mmol/L 139 140 141  Potassium 3.5 - 5.1 mmol/L 4.2 4.1 4.8  Chloride 98 - 111 mmol/L 109 106 110  CO2 22 - 32 mmol/L 22 26(A) 24  Calcium 8.9 - 10.3 mg/dL 9.1 9.7 9.2     Current antihypertensive regimen:  o Atenolol 25 mg 1 tablet daily o Chlorthalidone 25 mg 1/2 tablet daily o Lisinopril 5 mg 1 tablet daily  How often are you checking your Blood Pressure? 1-2x per week  Current home BP readings:  o 02.17 150/88 o 02.18 146/86 o 02.21 163/80  What recent interventions/DTPs have been made by any provider to improve Blood Pressure control since last CPP Visit: None  Any recent hospitalizations or ED visits since last visit with CPP? No  What diet changes have been made to improve Blood Pressure Control?  o No changes  What exercise is being done to improve your Blood Pressure Control?  o No Changes  Adherence  Review: Is the patient currently on ACE/ARB medication? Yes Does the patient have >5 day gap between last estimated fill dates? No  Maia Breslow, [place] Assistant [phone]

## 2020-12-01 ENCOUNTER — Other Ambulatory Visit: Payer: Self-pay | Admitting: Radiology

## 2020-12-04 ENCOUNTER — Other Ambulatory Visit: Payer: Self-pay

## 2020-12-04 ENCOUNTER — Encounter (HOSPITAL_COMMUNITY): Payer: Self-pay

## 2020-12-04 ENCOUNTER — Ambulatory Visit (HOSPITAL_COMMUNITY)
Admission: RE | Admit: 2020-12-04 | Discharge: 2020-12-04 | Disposition: A | Discharge status: Home / Self Care | Payer: Medicare HMO | Source: Ambulatory Visit | Attending: Internal Medicine | Admitting: Internal Medicine

## 2020-12-04 DIAGNOSIS — Z87891 Personal history of nicotine dependence: Secondary | ICD-10-CM | POA: Insufficient documentation

## 2020-12-04 DIAGNOSIS — C9030 Solitary plasmacytoma not having achieved remission: Secondary | ICD-10-CM | POA: Insufficient documentation

## 2020-12-04 DIAGNOSIS — C9000 Multiple myeloma not having achieved remission: Secondary | ICD-10-CM | POA: Insufficient documentation

## 2020-12-04 DIAGNOSIS — Z801 Family history of malignant neoplasm of trachea, bronchus and lung: Secondary | ICD-10-CM | POA: Insufficient documentation

## 2020-12-04 DIAGNOSIS — D472 Monoclonal gammopathy: Secondary | ICD-10-CM

## 2020-12-04 DIAGNOSIS — Z807 Family history of other malignant neoplasms of lymphoid, hematopoietic and related tissues: Secondary | ICD-10-CM | POA: Insufficient documentation

## 2020-12-04 DIAGNOSIS — Z8042 Family history of malignant neoplasm of prostate: Secondary | ICD-10-CM | POA: Insufficient documentation

## 2020-12-04 DIAGNOSIS — D47Z9 Other specified neoplasms of uncertain behavior of lymphoid, hematopoietic and related tissue: Secondary | ICD-10-CM | POA: Diagnosis not present

## 2020-12-04 DIAGNOSIS — Z79899 Other long term (current) drug therapy: Secondary | ICD-10-CM | POA: Insufficient documentation

## 2020-12-04 LAB — CBC WITH DIFFERENTIAL/PLATELET
Abs Immature Granulocytes: 0.07 10*3/uL (ref 0.00–0.07)
Basophils Absolute: 0 10*3/uL (ref 0.0–0.1)
Basophils Relative: 0 %
Eosinophils Absolute: 0.7 10*3/uL — ABNORMAL HIGH (ref 0.0–0.5)
Eosinophils Relative: 7 %
HCT: 41.4 % (ref 39.0–52.0)
Hemoglobin: 13.3 g/dL (ref 13.0–17.0)
Immature Granulocytes: 1 %
Lymphocytes Relative: 24 %
Lymphs Abs: 2.3 10*3/uL (ref 0.7–4.0)
MCH: 30.9 pg (ref 26.0–34.0)
MCHC: 32.1 g/dL (ref 30.0–36.0)
MCV: 96.1 fL (ref 80.0–100.0)
Monocytes Absolute: 0.9 10*3/uL (ref 0.1–1.0)
Monocytes Relative: 10 %
Neutro Abs: 5.5 10*3/uL (ref 1.7–7.7)
Neutrophils Relative %: 58 %
Platelets: 169 10*3/uL (ref 150–400)
RBC: 4.31 MIL/uL (ref 4.22–5.81)
RDW: 13.9 % (ref 11.5–15.5)
WBC: 9.4 10*3/uL (ref 4.0–10.5)
nRBC: 0 % (ref 0.0–0.2)

## 2020-12-04 LAB — PROTIME-INR
INR: 1.1 (ref 0.8–1.2)
Prothrombin Time: 13.4 seconds (ref 11.4–15.2)

## 2020-12-04 MED ORDER — MIDAZOLAM HCL 2 MG/2ML IJ SOLN
INTRAMUSCULAR | Status: AC | PRN
  Administered 2020-12-04: 1 mg via INTRAVENOUS

## 2020-12-04 MED ORDER — FLUMAZENIL 0.5 MG/5ML IV SOLN
INTRAVENOUS | Status: AC
  Filled 2020-12-04: qty 5

## 2020-12-04 MED ORDER — HYDROCODONE-ACETAMINOPHEN 5-325 MG PO TABS: 1.0000 | ORAL_TABLET | ORAL | Status: DC | PRN

## 2020-12-04 MED ORDER — SODIUM CHLORIDE 0.9 % IV SOLN: INTRAVENOUS | Status: DC

## 2020-12-04 MED ORDER — FENTANYL CITRATE (PF) 100 MCG/2ML IJ SOLN
INTRAMUSCULAR | Status: AC | PRN
  Administered 2020-12-04: 50 ug via INTRAVENOUS

## 2020-12-04 MED ORDER — MIDAZOLAM HCL 2 MG/2ML IJ SOLN
INTRAMUSCULAR | Status: AC
  Filled 2020-12-04: qty 2

## 2020-12-04 MED ORDER — FENTANYL CITRATE (PF) 100 MCG/2ML IJ SOLN
INTRAMUSCULAR | Status: AC
  Filled 2020-12-04: qty 2

## 2020-12-04 MED ORDER — NALOXONE HCL 0.4 MG/ML IJ SOLN
INTRAMUSCULAR | Status: AC
  Filled 2020-12-04: qty 1

## 2020-12-04 MED ORDER — LIDOCAINE HCL (PF) 1 % IJ SOLN
INTRAMUSCULAR | Status: AC | PRN
  Administered 2020-12-04: 10 mL via INTRADERMAL

## 2020-12-04 NOTE — H&P (Signed)
Chief Complaint: Patient was seen in consultation today for bone marrow biopsy at the request of [facility]  Referring Physician(s): Mohamed,Mohamed  Supervising Physician: Oley Balm  Patient Status: [facility] - Out-pt  History of Present Illness: Shane [name] is a 87 y.o. male being worked up for monoclonal gammopathy of unknown significance(MGUS) He is referred for bone marrow biopsy PMHx, meds, labs, imaging, allergies reviewed. Feels well, no recent fevers, chills, illness. Has been NPO today as directed.    Past Medical History:  Diagnosis Date  . Allergy   . Anemia, iron deficiency   . C. difficile diarrhea   . 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 Cataract    bil cataracts removed  . DDD (degenerative disc disease), cervical   . Dysplastic nevus of left upper extremity   . Elevated PSA   . Essential hypertension   . Extrinsic asthma   . GERD (gastroesophageal reflux disease)    no recent  . Gout   . Hiatal hernia   . Macular degeneration   . Neuromuscular disorder (HCC)    hiatal hernia  . Pneumonia    2014ish  . Spinal stenosis     Past Surgical History:  Procedure Laterality Date  . BACK SURGERY    . Bone Spur Right    Shoulder  . COLONOSCOPY    . COLONOSCOPY W/ POLYPECTOMY    . COLONOSCOPY WITH PROPOFOL N/A 11/19/2016   Procedure: COLONOSCOPY WITH PROPOFOL;  Surgeon: Sherrilyn Rist, MD;  Location: [facility] ENDOSCOPY;  Service: Gastroenterology;  Laterality: N/A;  . FECAL TRANSPLANT N/A 11/19/2016   Procedure: FECAL TRANSPLANT;  Surgeon: Sherrilyn Rist, MD;  Location: [facility] ENDOSCOPY;  Service: Gastroenterology;  Laterality: N/A;  . MOUTH SURGERY     2 teeth removed resulting in a dry socket in one of them  . POLYPECTOMY    . SKIN CANCER EXCISION      Allergies: Patient has no known allergies.  Medications: Prior to Admission medications   Medication Sig Start Date End Date Taking? Authorizing Provider  allopurinol (ZYLOPRIM) 300  MG tablet Take 1 tablet (300 mg total) by mouth daily. 10/11/20  Yes Nafziger, Kandee Keen, NP  amoxicillin (AMOXIL) 500 MG capsule Take 500 mg by mouth 3 (three) times daily.   Yes [provider]  atenolol (TENORMIN) 25 MG tablet Take 1 tablet (25 mg total) by mouth daily. 10/11/20  Yes Nafziger, Kandee Keen, NP  budesonide (ENTOCORT EC) 3 MG 24 hr capsule Take 3 capsules (9 mg total) by mouth daily. 11/27/20  Yes Danis, Andreas Blower, MD  chlorthalidone (HYGROTON) 25 MG tablet Take 0.5 tablets (12.5 mg total) by mouth daily. 10/11/20  Yes Nafziger, Kandee Keen, NP  finasteride (PROSCAR) 5 MG tablet Take 1 tablet (5 mg total) by mouth daily. 10/11/20  Yes Nafziger, Kandee Keen, NP  lisinopril (ZESTRIL) 5 MG tablet Take 1 tablet (5 mg total) by mouth daily. 03/07/20  Yes Nafziger, Kandee Keen, NP  Multiple Vitamins-Minerals (PRESERVISION AREDS 2) CAPS Take 1 capsule by mouth 2 (two) times daily.   Yes [provider]  simvastatin (ZOCOR) 10 MG tablet Take 1 tablet (10 mg total) by mouth at bedtime. 10/10/20  Yes Nafziger, Kandee Keen, NP  acetaminophen (TYLENOL) 500 MG tablet Take 500-1,000 mg by mouth every 6 (six) hours as needed (for pain).    [provider]     Family History  Problem Relation Age of Onset  . Multiple myeloma Brother   . Prostate  cancer Brother   . Hypertension Father        ?  . Lung cancer Sister   . Colon cancer Neg Hx   . Stomach cancer Neg Hx   . Esophageal cancer Neg Hx   . Rectal cancer Neg Hx     Social History   Socioeconomic History  . Marital status: Married    Spouse name: Not on file  . Number of children: 3  . Years of education: Not on file  . Highest education level: Not on file  Occupational History  . Occupation: retired  Tobacco Use  . Smoking status: Former Smoker    Years: 35.00    Types: Cigarettes  . Smokeless tobacco: Never Used  . Tobacco comment: quit late 1980's  Vaping Use  . Vaping Use: Never used  Substance and Sexual Activity  . Alcohol use: No   . Drug use: No  . Sexual activity: Not on file  Other Topics Concern  . Not on file  Social History Narrative   Retired from [employer]    Married for 55 years    Three children all live locally    Likes to play golf and go to [employer].    Social Determinants of Health   Financial Resource Strain: Low Risk   . Difficulty of Paying Living Expenses: Not hard at all  Food Insecurity: Not on file  Transportation Needs: No Transportation Needs  . Lack of Transportation (Medical): No  . Lack of Transportation (Non-Medical): No  Physical Activity: Inactive  . Days of Exercise per Week: 0 days  . Minutes of Exercise per Session: 0 min  Stress: No Stress Concern Present  . Feeling of Stress : Not at all  Social Connections: Moderately Integrated  . Frequency of Communication with Friends and Family: Twice a week  . Frequency of Social Gatherings with Friends and Family: Once a week  . Attends Religious Services: More than 4 times per year  . Active Member of Clubs or Organizations: No  . Attends Banker Meetings: Never  . Marital Status: Married     Review of Systems: A 12 point ROS discussed and pertinent positives are indicated in the HPI above.  All other systems are negative.  Review of Systems  Vital Signs: BP (!) 187/87   Pulse 62   Temp 98.5 F (36.9 C) (Oral)   Resp 16   SpO2 98%   Physical Exam HENT:     Mouth/Throat:     Mouth: Mucous membranes are moist.     Pharynx: Oropharynx is clear.  Cardiovascular:     Rate and Rhythm: Normal rate and regular rhythm.     Heart sounds: Normal heart sounds.  Pulmonary:     Effort: Pulmonary effort is normal. No respiratory distress.     Breath sounds: Normal breath sounds.  Skin:    General: Skin is warm and dry.  Neurological:     General: No focal deficit present.     Mental Status: He is alert and oriented to person, place, and time.  Psychiatric:        Mood and Affect: Mood normal.         Thought Content: Thought content normal.        Judgment: Judgment normal.      Imaging: US RENAL  Result Date: 11/08/2020 CLINICAL DATA:  Chronic kidney disease stage 4 EXAM: RENAL / URINARY TRACT ULTRASOUND COMPLETE COMPARISON:  CT 04/26/2016 FINDINGS: Right Kidney:  Renal measurements: 7.8 x 4.1 x 4.5 cm = volume: 75 mL. Visualization of the right kidney is slightly limited, however, there has been interval progressive cortical volume loss. Mild renal cortical hyperechogenicity is present in keeping with changes of underlying medical renal disease. No hydronephrosis. No solid intrarenal mass or calcification is identified. 4.4 cm simple cyst is seen within the upper pole of the right kidney. 3.3 cm simple cortical cyst is seen exophytically from the interpolar region of the right kidney. Left Kidney: Renal measurements: 10.5 x 5.1 x 5.0 cm = volume: 140 mL. Renal cortical echogenicity is mildly, diffusely increased in keeping with changes of underlying medical renal disease. Cortical thickness and overall volume is preserved in comparison to prior CT examination. No hydronephrosis. No solid intrarenal mass or calcification identified. 2.1 cm simple cortical cyst arises exophytically from the interpolar region. Bladder: Appears normal for degree of bladder distention. Other: Moderate prostatic hypertrophy. IMPRESSION: Increased renal cortical echogenicity in keeping with changes of medical renal disease. Progressive cortical volume loss of the right kidney when compared to prior CT examination of 04/26/2016. Multiple simple cortical cysts.  Further follow-up is not required. Electronically Signed   By: Helyn Numbers MD   On: 11/08/2020 16:25   DG Bone Survey Met  Result Date: 11/22/2020 CLINICAL DATA:  Monoclonal gammopathy of unknown significance. EXAM: METASTATIC BONE SURVEY COMPARISON:  None. FINDINGS: No definite lytic or sclerotic lesion is noted in the skull, spine, rib cage pelvis or  extremities. IMPRESSION: Negative. Electronically Signed   By: Lupita Raider M.D.   On: 11/22/2020 09:45    Labs:  CBC: Recent Labs    10/05/20 0932 10/27/20 0000 11/16/20 1124 12/04/20 0748  WBC 9.6  --  10.3 9.4  HGB 13.3 13.1* 13.2 13.3  HCT 40.1  --  40.6 41.4  PLT 178.0  --  169 169    COAGS: Recent Labs    12/04/20 0748  INR 1.1    BMP: Recent Labs    10/05/20 0932 10/27/20 0000 11/16/20 1124  NA 141 140 139  K 4.8 4.1 4.2  CL 110 106 109  CO2 24 26* 22  GLUCOSE 105*  --  94  BUN 50*  --  43*  CALCIUM 9.2 9.7 9.1  CREATININE 2.05* 1.7* 2.04*  GFRNONAA  --  35 31*  GFRAA  --  40  --     LIVER FUNCTION TESTS: Recent Labs    10/05/20 0932 10/27/20 0000 11/16/20 1124  BILITOT 0.7  --  0.8  AST 18  --  20  ALT 11  --  11  ALKPHOS 26*  --  24*  PROT 6.3  --  6.7  ALBUMIN 3.9 4.1 3.6    TUMOR MARKERS: No results for input(s): AFPTM, CEA, CA199, CHROMGRNA in the last 8760 hours.  Assessment and Plan: MGUS For image guided bone marrow biopsy Labs reviewed Risks and benefits of bone marrow biopsy was discussed with the patient and/or patient's family including, but not limited to bleeding, infection, damage to adjacent structures or low yield requiring additional tests.  All of the questions were answered and there is agreement to proceed.  Consent signed and in chart.    Thank you for this interesting consult.  I greatly enjoyed meeting YOUSOF MIDGLEY and look forward to participating in their care.  A copy of this report was sent to the requesting provider on this date.  Electronically Signed: Brayton El, PA-C 12/04/2020, 8:37 AM  I spent a total of 20 minutes in face to face in clinical consultation, greater than 50% of which was counseling/coordinating care for bone marrow biopsy

## 2020-12-04 NOTE — Discharge Instructions (Addendum)
Please call Interventional Radiology clinic [phone] with any questions or concerns. ° °You may remove your dressing and shower tomorrow. ° ° °Bone Marrow Aspiration and Bone Marrow Biopsy, Adult, Care After °This sheet gives you information about how to care for yourself after your procedure. Your health care provider may also give you more specific instructions. If you have problems or questions, contact your health care provider. °What can I expect after the procedure? °After the procedure, it is common to have: °Mild pain and tenderness. °Swelling. °Bruising. °Follow these instructions at home: °Puncture site care °Follow instructions from your health care provider about how to take care of the puncture site. Make sure you: °Wash your hands with soap and water before and after you change your bandage (dressing). If soap and water are not available, use hand sanitizer. °Change your dressing as told by your health care provider. °Check your puncture site every day for signs of infection. Check for: °More redness, swelling, or pain. °Fluid or blood. °Warmth. °Pus or a bad smell.   °Activity °Return to your normal activities as told by your health care provider. Ask your health care provider what activities are safe for you. °Do not lift anything that is heavier than 10 lb (4.5 kg), or the limit that you are told, until your health care provider says that it is safe. °Do not drive for 24 hours if you were given a sedative during your procedure. °General instructions °Take over-the-counter and prescription medicines only as told by your health care provider. °Do not take baths, swim, or use a hot tub until your health care provider approves. Ask your health care provider if you may take showers. You may only be allowed to take sponge baths. °If directed, put ice on the affected area. To do this: °Put ice in a plastic bag. °Place a towel between your skin and the bag. °Leave the ice on for 20 minutes, 2-3 times a  day. °Keep all follow-up visits as told by your health care provider. This is important.   °Contact a health care provider if: °Your pain is not controlled with medicine. °You have a fever. °You have more redness, swelling, or pain around the puncture site. °You have fluid or blood coming from the puncture site. °Your puncture site feels warm to the touch. °You have pus or a bad smell coming from the puncture site. °Summary °After the procedure, it is common to have mild pain, tenderness, swelling, and bruising. °Follow instructions from your health care provider about how to take care of the puncture site and what activities are safe for you. °Take over-the-counter and prescription medicines only as told by your health care provider. °Contact a health care provider if you have any signs of infection, such as fluid or blood coming from the puncture site. °This information is not intended to replace advice given to you by your health care provider. Make sure you discuss any questions you have with your health care provider. °Document Revised: 02/02/2019 Document Reviewed: 02/02/2019 °Elsevier Patient Education © 2021 [employer] ° ° °Moderate Conscious Sedation, Adult, Care After °This sheet gives you information about how to care for yourself after your procedure. Your health care provider may also give you more specific instructions. If you have problems or questions, contact your health care provider. °What can I expect after the procedure? °After the procedure, it is common to have: °Sleepiness for several hours. °Impaired judgment for several hours. °Difficulty with balance. °Vomiting if you eat too   soon. °Follow these instructions at home: °For the time period you were told by your health care provider: °Rest. °Do not participate in activities where you could fall or become injured. °Do not drive or use machinery. °Do not drink alcohol. °Do not take sleeping pills or medicines that cause drowsiness. °Do not  make important decisions or sign legal documents. °Do not take care of children on your own.  °  °  °Eating and drinking °Follow the diet recommended by your health care provider. °Drink enough fluid to keep your urine pale yellow. °If you vomit: °Drink water, juice, or soup when you can drink without vomiting. °Make sure you have little or no nausea before eating solid foods.   °General instructions °Take over-the-counter and prescription medicines only as told by your health care provider. °Have a responsible adult stay with you for the time you are told. It is important to have someone help care for you until you are awake and alert. °Do not smoke. °Keep all follow-up visits as told by your health care provider. This is important. °Contact a health care provider if: °You are still sleepy or having trouble with balance after 24 hours. °You feel light-headed. °You keep feeling nauseous or you keep vomiting. °You develop a rash. °You have a fever. °You have redness or swelling around the IV site. °Get help right away if: °You have trouble breathing. °You have new-onset confusion at home. °Summary °After the procedure, it is common to feel sleepy, have impaired judgment, or feel nauseous if you eat too soon. °Rest after you get home. Know the things you should not do after the procedure. °Follow the diet recommended by your health care provider and drink enough fluid to keep your urine pale yellow. °Get help right away if you have trouble breathing or new-onset confusion at home. °This information is not intended to replace advice given to you by your health care provider. Make sure you discuss any questions you have with your health care provider. °Document Revised: 01/14/2020 Document Reviewed: 08/12/2019 °Elsevier Patient Education © 2021 [employer]  °

## 2020-12-04 NOTE — Procedures (Signed)
  Procedure: CT bone marrow biopsy R iliac EBL:   minimal Complications:  none immediate  See full dictation in [facility].  Dillard Cannon MD Main # [phone] Pager  [phone] Mobile [phone]

## 2020-12-06 ENCOUNTER — Inpatient Hospital Stay: Payer: Medicare HMO | Attending: Internal Medicine | Admitting: Internal Medicine

## 2020-12-06 ENCOUNTER — Encounter: Payer: Self-pay | Admitting: Internal Medicine

## 2020-12-06 ENCOUNTER — Other Ambulatory Visit: Payer: Self-pay

## 2020-12-06 VITALS — BP 186/80 | HR 62 | Temp 97.6°F | Resp 17 | Ht 70.0 in | Wt 198.3 lb

## 2020-12-06 DIAGNOSIS — C9000 Multiple myeloma not having achieved remission: Secondary | ICD-10-CM | POA: Insufficient documentation

## 2020-12-06 DIAGNOSIS — R5383 Other fatigue: Secondary | ICD-10-CM | POA: Diagnosis not present

## 2020-12-06 DIAGNOSIS — N184 Chronic kidney disease, stage 4 (severe): Secondary | ICD-10-CM | POA: Diagnosis not present

## 2020-12-06 DIAGNOSIS — Z79899 Other long term (current) drug therapy: Secondary | ICD-10-CM | POA: Insufficient documentation

## 2020-12-06 DIAGNOSIS — Z85828 Personal history of other malignant neoplasm of skin: Secondary | ICD-10-CM | POA: Diagnosis not present

## 2020-12-06 DIAGNOSIS — N400 Enlarged prostate without lower urinary tract symptoms: Secondary | ICD-10-CM | POA: Diagnosis not present

## 2020-12-06 DIAGNOSIS — Z86018 Personal history of other benign neoplasm: Secondary | ICD-10-CM | POA: Diagnosis not present

## 2020-12-06 DIAGNOSIS — D472 Monoclonal gammopathy: Secondary | ICD-10-CM

## 2020-12-06 DIAGNOSIS — I129 Hypertensive chronic kidney disease with stage 1 through stage 4 chronic kidney disease, or unspecified chronic kidney disease: Secondary | ICD-10-CM | POA: Insufficient documentation

## 2020-12-06 DIAGNOSIS — Z7952 Long term (current) use of systemic steroids: Secondary | ICD-10-CM | POA: Insufficient documentation

## 2020-12-06 NOTE — Progress Notes (Signed)
[facility] [phone]   [phone]  OFFICE PROGRESS NOTE  Dorothyann Peng, NP [place] [address]  DIAGNOSIS: Monoclonal gammopathy of undetermined significance diagnosed in February 2022.  PRIOR THERAPY: None  CURRENT THERAPY: Observation  INTERVAL HISTORY: Shane [name] 87 y.o. male returns to the clinic today for follow-up visit accompanied by his wife and son.  The patient is feeling fine today with no concerning complaints except for mild fatigue.  He denied having any current chest pain, shortness of breath, cough or hemoptysis.  He denied having any fever or chills.  He has no nausea, vomiting, diarrhea or constipation.  He has no headache or visual changes.  He has no recent weight loss or night sweats.  The patient had repeat myeloma panel in addition to a bone marrow biopsy and aspirate performed recently and is here for evaluation and discussion of his lab and biopsy results.  MEDICAL HISTORY: Past Medical History:  Diagnosis Date  . Allergy   . Anemia, iron deficiency   . C. difficile diarrhea   . 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 Cataract    bil cataracts removed  . DDD (degenerative disc disease), cervical   . Dysplastic nevus of left upper extremity   . Elevated PSA   . Essential hypertension   . Extrinsic asthma   . GERD (gastroesophageal reflux disease)    no recent  . Gout   . Hiatal hernia   . Macular degeneration   . Neuromuscular disorder ([place])    hiatal hernia  . Pneumonia    2014ish  . Spinal stenosis     ALLERGIES:  has No Known Allergies.  MEDICATIONS:  Current Outpatient Medications  Medication Sig Dispense Refill  . acetaminophen (TYLENOL) 500 MG tablet Take 500-1,000 mg by mouth every 6 (six) hours as needed (for pain).    Marland Kitchen allopurinol (ZYLOPRIM) 300 MG tablet Take 1 tablet (300 mg total) by mouth daily. 90 tablet 3  . atenolol (TENORMIN) 25 MG tablet Take 1 tablet  (25 mg total) by mouth daily. 90 tablet 3  . budesonide (ENTOCORT EC) 3 MG 24 hr capsule Take 3 capsules (9 mg total) by mouth daily. 270 capsule 1  . chlorthalidone (HYGROTON) 25 MG tablet Take 0.5 tablets (12.5 mg total) by mouth daily. 45 tablet 3  . finasteride (PROSCAR) 5 MG tablet Take 1 tablet (5 mg total) by mouth daily. 90 tablet 3  . lisinopril (ZESTRIL) 5 MG tablet Take 1 tablet (5 mg total) by mouth daily. 90 tablet 3  . Multiple Vitamins-Minerals (PRESERVISION AREDS 2) CAPS Take 1 capsule by mouth 2 (two) times daily.    . simvastatin (ZOCOR) 10 MG tablet Take 1 tablet (10 mg total) by mouth at bedtime. 90 tablet 3   No current facility-administered medications for this visit.    SURGICAL HISTORY:  Past Surgical History:  Procedure Laterality Date  . BACK SURGERY    . Bone Spur Right    Shoulder  . COLONOSCOPY    . COLONOSCOPY W/ POLYPECTOMY    . COLONOSCOPY WITH PROPOFOL N/A 11/19/2016   Procedure: COLONOSCOPY WITH PROPOFOL;  Surgeon: Doran Stabler, MD;  Location: [facility];  Service: Gastroenterology;  Laterality: N/A;  . FECAL TRANSPLANT N/A 11/19/2016   Procedure: FECAL TRANSPLANT;  Surgeon: Doran Stabler, MD;  Location: [facility];  Service: Gastroenterology;  Laterality: N/A;  . MOUTH SURGERY  2 teeth removed resulting in a dry socket in one of them  . POLYPECTOMY    . SKIN CANCER EXCISION      REVIEW OF SYSTEMS:  Constitutional: positive for fatigue Eyes: negative Ears, nose, mouth, throat, and face: negative Respiratory: negative Cardiovascular: negative Gastrointestinal: negative Genitourinary:negative Integument/breast: negative Hematologic/lymphatic: negative Musculoskeletal:negative Neurological: negative Behavioral/Psych: negative Endocrine: negative Allergic/Immunologic: negative   PHYSICAL EXAMINATION: General appearance: alert, cooperative, fatigued and no distress Head: Normocephalic, without obvious abnormality,  atraumatic Neck: no adenopathy, no JVD, supple, symmetrical, trachea midline and thyroid not enlarged, symmetric, no tenderness/mass/nodules Lymph nodes: Cervical, supraclavicular, and axillary nodes normal. Resp: clear to auscultation bilaterally Back: symmetric, no curvature. ROM normal. No CVA tenderness. Cardio: regular rate and rhythm, S1, S2 normal, no murmur, click, rub or gallop GI: soft, non-tender; bowel sounds normal; no masses,  no organomegaly Extremities: extremities normal, atraumatic, no cyanosis or edema Neurologic: Alert and oriented X 3, normal strength and tone. Normal symmetric reflexes. Normal coordination and gait  ECOG PERFORMANCE STATUS: 1 - Symptomatic but completely ambulatory  Blood pressure (!) 186/80, pulse 62, temperature 97.6 F (36.4 C), temperature source Tympanic, resp. rate 17, height '5\' 10"'  (1.778 m), weight 198 lb 4.8 oz (89.9 kg), SpO2 97 %.  LABORATORY DATA: Lab Results  Component Value Date   WBC 9.4 12/04/2020   HGB 13.3 12/04/2020   HCT 41.4 12/04/2020   MCV 96.1 12/04/2020   PLT 169 12/04/2020      Chemistry      Component Value Date/Time   NA 139 11/16/2020 1124   NA 140 10/27/2020 0000   K 4.2 11/16/2020 1124   CL 109 11/16/2020 1124   CO2 22 11/16/2020 1124   BUN 43 (H) 11/16/2020 1124   CREATININE 2.04 (H) 11/16/2020 1124   GLU 100 10/27/2020 0000      Component Value Date/Time   CALCIUM 9.1 11/16/2020 1124   ALKPHOS 24 (L) 11/16/2020 1124   AST 20 11/16/2020 1124   ALT 11 11/16/2020 1124   BILITOT 0.8 11/16/2020 1124       RADIOGRAPHIC STUDIES: US RENAL  Result Date: 11/08/2020 CLINICAL DATA:  Chronic kidney disease stage 4 EXAM: RENAL / URINARY TRACT ULTRASOUND COMPLETE COMPARISON:  CT 04/26/2016 FINDINGS: Right Kidney: Renal measurements: 7.8 x 4.1 x 4.5 cm = volume: 75 mL. Visualization of the right kidney is slightly limited, however, there has been interval progressive cortical volume loss. Mild renal cortical  hyperechogenicity is present in keeping with changes of underlying medical renal disease. No hydronephrosis. No solid intrarenal mass or calcification is identified. 4.4 cm simple cyst is seen within the upper pole of the right kidney. 3.3 cm simple cortical cyst is seen exophytically from the interpolar region of the right kidney. Left Kidney: Renal measurements: 10.5 x 5.1 x 5.0 cm = volume: 140 mL. Renal cortical echogenicity is mildly, diffusely increased in keeping with changes of underlying medical renal disease. Cortical thickness and overall volume is preserved in comparison to prior CT examination. No hydronephrosis. No solid intrarenal mass or calcification identified. 2.1 cm simple cortical cyst arises exophytically from the interpolar region. Bladder: Appears normal for degree of bladder distention. Other: Moderate prostatic hypertrophy. IMPRESSION: Increased renal cortical echogenicity in keeping with changes of medical renal disease. Progressive cortical volume loss of the right kidney when compared to prior CT examination of 04/26/2016. Multiple simple cortical cysts.  Further follow-up is not required. Electronically Signed   By: Fidela Salisbury MD   On: 11/08/2020 16:25  CT Biopsy  Result Date: 12/04/2020 CLINICAL DATA:  Multiple myeloma EXAM: CT GUIDED DEEP ILIAC BONE ASPIRATION AND CORE BIOPSY TECHNIQUE: Patient was placed prone on the CT gantry and limited axial scans through the pelvis were obtained. Appropriate skin entry site was identified. Skin site was marked, prepped with chlorhexidine, draped in usual sterile fashion, and infiltrated locally with 1% lidocaine. Intravenous Fentanyl 7mg and Versed 155mwere administered as conscious sedation during continuous monitoring of the patient's level of consciousness and physiological / cardiorespiratory status by the radiology RN, with a total moderate sedation time of 17 minutes. Under CT fluoroscopic guidance an 11-gauge Cook trocar bone  needle was advanced into the right iliac bone just lateral to the sacroiliac joint. Once needle tip position was confirmed, core and aspiration samples were obtained, submitted to pathology for approval. Post procedure scans show no hematoma or fracture. Patient tolerated procedure well. COMPLICATIONS: COMPLICATIONS none IMPRESSION: 1. Technically successful CT guided right iliac bone core and aspiration biopsy. Electronically Signed   By: D Lucrezia Europe.D.   On: 12/04/2020 11:08   DG Bone Survey Met  Result Date: 11/22/2020 CLINICAL DATA:  Monoclonal gammopathy of unknown significance. EXAM: METASTATIC BONE SURVEY COMPARISON:  None. FINDINGS: No definite lytic or sclerotic lesion is noted in the skull, spine, rib cage pelvis or extremities. IMPRESSION: Negative. Electronically Signed   By: JaMarijo Conception.D.   On: 11/22/2020 09:45   CT BONE MARROW BIOPSY & ASPIRATION  Result Date: 12/04/2020 CLINICAL DATA:  Multiple myeloma EXAM: CT GUIDED DEEP ILIAC BONE ASPIRATION AND CORE BIOPSY TECHNIQUE: Patient was placed prone on the CT gantry and limited axial scans through the pelvis were obtained. Appropriate skin entry site was identified. Skin site was marked, prepped with chlorhexidine, draped in usual sterile fashion, and infiltrated locally with 1% lidocaine. Intravenous Fentanyl 5035mand Versed 1mg39mre administered as conscious sedation during continuous monitoring of the patient's level of consciousness and physiological / cardiorespiratory status by the radiology RN, with a total moderate sedation time of 17 minutes. Under CT fluoroscopic guidance an 11-gauge Cook trocar bone needle was advanced into the right iliac bone just lateral to the sacroiliac joint. Once needle tip position was confirmed, core and aspiration samples were obtained, submitted to pathology for approval. Post procedure scans show no hematoma or fracture. Patient tolerated procedure well. COMPLICATIONS: COMPLICATIONS none IMPRESSION:  1. Technically successful CT guided right iliac bone core and aspiration biopsy. Electronically Signed   By: D  HLucrezia Europe.   On: 12/04/2020 11:08    ASSESSMENT AND PLAN: This is a very pleasant 88 y41rs old white male recently diagnosed with monoclonal gammopathy of undetermined significance. The patient had myeloma panel performed on 11/16/2020 1 that showed beta2 microglobulin of 6.0.  The free kappa light chain was 64.5 and the free lambda light chain 163.8 with a ratio of 0.39.  He has normal IgG, IgA and IgM.  The patient has no anemia and continues to have mild renal insufficiency. The bone marrow biopsy and aspirate showed minimal feature of plasma cell neoplasm with only 1% of plasma cells present. I had a lengthy discussion with the patient and his family today about his current condition and treatment options. I explained to the patient that there is no sufficient evidence for multiple myeloma at this point and the patient is likely having monoclonal gammopathy of undetermined significance but there is always a risk for conversion to multiple myeloma in the future. I recommended for the patient  to continue on observation with repeat myeloma panel in 6 months. The patient and his family are in agreement with the current plan. He was advised to call immediately if he has any other concerning symptoms in the interval. The patient voices understanding of current disease status and treatment options and is in agreement with the current care plan.  All questions were answered. The patient knows to call the clinic with any problems, questions or concerns. We can certainly see the patient much sooner if necessary. The total time spent in the appointment was 30 minutes.  Disclaimer: This note was dictated with voice recognition software. Similar sounding words can inadvertently be transcribed and may not be corrected upon review.

## 2020-12-06 NOTE — Patient Instructions (Signed)
Monoclonal Gammopathy of Undetermined Significance Monoclonal gammopathy of undetermined significance (MGUS) is a condition in which there is too much of a protein called monoclonal protein, or M protein, in the blood. MGUS can cause you to have too many cells in your blood and not enough space for healthy cells. This condition does not cause symptoms, but it may increase your risk of developing multiple myeloma or other blood disorders in the future. What are the causes? The cause of this condition is not known. Genetics and the environment may play a role. What increases the risk? You are more likely to develop this condition if:  You are African American.  You are age 50 or older.  You are male.  You have an autoimmune disease.  You have been exposed to radiation.  You have a family history of MGUS. What are the signs or symptoms? There are no symptoms of this condition. How is this diagnosed? This condition may be diagnosed with a blood test that checks for M protein.   How is this treated? Treatment may involve monitoring your condition. This may include:  Having regular exams. This will allow your health care provider to monitor your health.  Having tests done regularly, such as: ? Blood tests to check for M protein in your body. ? Imaging tests, such as a CT scan. ? A bone marrow biopsy. This test involves taking a sample of bone marrow from your body so it can be looked at under a microscope. Follow these instructions at home:  Keep all follow-up visits as told by your health care provider. This is important. Contact a health care provider if:  You have trouble swallowing.  You have pain in your back or ribs.  You have a fever.  You are bruising easily. Get help right away if:  You break a bone.  You have trouble breathing. Summary  Monoclonal gammopathy of undetermined significance (MGUS) is a condition in which there is too much of a protein called  monoclonal protein, or M protein, in the blood.  This condition may be diagnosed with a blood test that checks for M protein.  Treatment for this condition may involve having tests done regularly. Tests may include blood tests, imaging tests, and a bone marrow biopsy. This information is not intended to replace advice given to you by your health care provider. Make sure you discuss any questions you have with your health care provider. Document Revised: 08/05/2019 Document Reviewed: 08/05/2019 Elsevier Patient Education  2021 Elsevier Inc.  

## 2020-12-11 ENCOUNTER — Telehealth: Payer: Self-pay | Admitting: Internal Medicine

## 2020-12-11 NOTE — Telephone Encounter (Signed)
Scheduled per los. Called and spoke with patient. Confirmed appt 

## 2020-12-12 ENCOUNTER — Encounter (HOSPITAL_COMMUNITY): Payer: Self-pay | Admitting: Internal Medicine

## 2020-12-15 ENCOUNTER — Encounter (HOSPITAL_COMMUNITY): Payer: Self-pay | Admitting: Internal Medicine

## 2020-12-18 ENCOUNTER — Other Ambulatory Visit: Payer: Self-pay

## 2020-12-18 ENCOUNTER — Ambulatory Visit (INDEPENDENT_AMBULATORY_CARE_PROVIDER_SITE_OTHER): Payer: Medicare HMO | Admitting: Ophthalmology

## 2020-12-18 ENCOUNTER — Encounter (INDEPENDENT_AMBULATORY_CARE_PROVIDER_SITE_OTHER): Payer: Self-pay | Admitting: Ophthalmology

## 2020-12-18 DIAGNOSIS — H35372 Puckering of macula, left eye: Secondary | ICD-10-CM

## 2020-12-18 DIAGNOSIS — H353123 Nonexudative age-related macular degeneration, left eye, advanced atrophic without subfoveal involvement: Secondary | ICD-10-CM | POA: Diagnosis not present

## 2020-12-18 DIAGNOSIS — H353212 Exudative age-related macular degeneration, right eye, with inactive choroidal neovascularization: Secondary | ICD-10-CM

## 2020-12-18 DIAGNOSIS — H353124 Nonexudative age-related macular degeneration, left eye, advanced atrophic with subfoveal involvement: Secondary | ICD-10-CM | POA: Diagnosis not present

## 2020-12-18 LAB — SURGICAL PATHOLOGY

## 2020-12-18 NOTE — Progress Notes (Signed)
12/18/2020     CHIEF COMPLAINT Patient presents for Retina Follow Up (6 Mo F/U OU//Pt reports intermittent floater "probably left" eye, but sts he isn't sure which eye. Pt denies changes to [place] OU.)   HISTORY OF PRESENT ILLNESS: Shane [name] is a 87 y.o. male who presents to the clinic today for:   HPI    Retina Follow Up    Patient presents with  Dry AMD.  In both eyes.  This started 6 months ago.  Severity is moderate.  Duration of 6 months.  Since onset it is stable. Additional comments: 6 Mo F/U OU  Pt reports intermittent floater "probably left" eye, but sts he isn't sure which eye. Pt denies changes to [place].       Last edited by Rockie Neighbours, [place] on 12/18/2020 10:29 AM. (History)      Referring physician: Dorothyann Peng, NP [place],  [place] [address]  HISTORICAL INFORMATION:   Selected notes from the [facility]: No current outpatient medications on file. (Ophthalmic Drugs)   No current facility-administered medications for this visit. (Ophthalmic Drugs)   Current Outpatient Medications (Other)  Medication Sig  . acetaminophen (TYLENOL) 500 MG tablet Take 500-1,000 mg by mouth every 6 (six) hours as needed (for pain).  Marland Kitchen allopurinol (ZYLOPRIM) 300 MG tablet Take 1 tablet (300 mg total) by mouth daily.  Marland Kitchen atenolol (TENORMIN) 25 MG tablet Take 1 tablet (25 mg total) by mouth daily.  . budesonide (ENTOCORT EC) 3 MG 24 hr capsule Take 3 capsules (9 mg total) by mouth daily.  . chlorthalidone (HYGROTON) 25 MG tablet Take 0.5 tablets (12.5 mg total) by mouth daily.  . finasteride (PROSCAR) 5 MG tablet Take 1 tablet (5 mg total) by mouth daily.  Marland Kitchen lisinopril (ZESTRIL) 5 MG tablet Take 1 tablet (5 mg total) by mouth daily.  . Multiple Vitamins-Minerals (PRESERVISION AREDS 2) CAPS Take 1 capsule by mouth 2 (two) times daily.  . simvastatin (ZOCOR) 10 MG tablet Take 1 tablet (10 mg total) by mouth at bedtime.    No current facility-administered medications for this visit. (Other)      REVIEW OF SYSTEMS:    ALLERGIES No Known Allergies  PAST MEDICAL HISTORY Past Medical History:  Diagnosis Date  . Allergy   . Anemia, iron deficiency   . C. difficile diarrhea   . 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 Cataract    bil cataracts removed  . DDD (degenerative disc disease), cervical   . Dysplastic nevus of left upper extremity   . Elevated PSA   . Essential hypertension   . Extrinsic asthma   . GERD (gastroesophageal reflux disease)    no recent  . Gout   . Hiatal hernia   . Macular degeneration   . Neuromuscular disorder ([place])    hiatal hernia  . Pneumonia    2014ish  . Spinal stenosis    Past Surgical History:  Procedure Laterality Date  . BACK SURGERY    . Bone Spur Right    Shoulder  . COLONOSCOPY    . COLONOSCOPY W/ POLYPECTOMY    . COLONOSCOPY WITH PROPOFOL N/A 11/19/2016   Procedure: COLONOSCOPY WITH PROPOFOL;  Surgeon: Doran Stabler, MD;  Location: [facility];  Service: Gastroenterology;  Laterality: N/A;  . FECAL TRANSPLANT N/A 11/19/2016   Procedure: FECAL TRANSPLANT;  Surgeon: Doran Stabler, MD;  [facility];  Service: Gastroenterology;  Laterality: N/A;  . MOUTH SURGERY     2 teeth removed resulting in a dry socket in one of them  . POLYPECTOMY    . SKIN CANCER EXCISION      FAMILY HISTORY Family History  Problem Relation Age of Onset  . Multiple myeloma Brother   . Prostate cancer Brother   . Hypertension Father        ?  . Lung cancer Sister   . Colon cancer Neg Hx   . Stomach cancer Neg Hx   . Esophageal cancer Neg Hx   . Rectal cancer Neg Hx     SOCIAL HISTORY Social History   Tobacco Use  . Smoking status: Former Smoker    Years: 35.00    Types: Cigarettes  . Smokeless tobacco: Never Used  . Tobacco comment: quit late 1980's  Vaping Use  . Vaping Use: Never used  Substance Use Topics  . Alcohol use: No  . Drug  use: No         OPHTHALMIC EXAM: Base Eye Exam    Visual Acuity (ETDRS)      Right Left   Dist cc CF @ 2' 20/70 -2   Dist ph cc NI NI   Correction: Glasses       Tonometry (Tonopen, 10:29 AM)      Right Left   Pressure 15 13       Pupils      Pupils Dark Light Shape React APD   Right PERRL 5.5 4.5 Round Brisk None   Left PERRL 5.5 4.5 Round Brisk None       Visual Fields (Counting fingers)      Left Right     Full   Restrictions Total superior nasal deficiency        Extraocular Movement      Right Left    Full Full       Neuro/Psych    Oriented x3: Yes   Mood/Affect: Normal       Dilation    Both eyes: 1.0% Mydriacyl, 2.5% Phenylephrine @ 10:33 AM        Slit Lamp and Fundus Exam    External Exam      Right Left   External Normal Normal       Slit Lamp Exam      Right Left   Lids/Lashes Normal Normal   Conjunctiva/Sclera White and quiet White and quiet   Cornea Clear Clear   Anterior Chamber Deep and quiet Deep and quiet   Iris Round and reactive Round and reactive   Lens Centered posterior chamber intraocular lens Centered posterior chamber intraocular lens   Anterior Vitreous Normal Normal       Fundus Exam      Right Left   Posterior Vitreous Posterior vitreous detachment Posterior vitreous detachment   Disc Normal Normal   C/D Ratio 0.35 0.25   Macula Disciform scar 12 da size, no active edges Geographic atrophy 1.5 da size, small isle of foveal RPE   Vessels Normal Normal   Periphery Normal Normal          IMAGING AND PROCEDURES  Imaging and Procedures for 12/18/20  OCT, Retina - OU - Both Eyes       Right Eye Quality was good. Scan locations included subfoveal. Central Foveal Thickness: 458. Findings include abnormal foveal contour, cystoid macular edema, disciform scar.   Left Eye Quality was good. Scan locations included  subfoveal. Central Foveal Thickness: 310. Progression has been stable. Findings include abnormal  foveal contour, epiretinal membrane, outer retinal atrophy, central retinal atrophy, inner retinal atrophy.   Notes OU with no signs of active edges, to wet AMD.  OS with small island of foveal pigment remaining, accounts for acuity, no signs of CNVM.                ASSESSMENT/PLAN:  Left epiretinal membrane Not active, no impact on acuity observe  Exudative age-related macular degeneration of right eye with inactive choroidal neovascularization (HCC) No signs of active CNVM  Advanced nonexudative age-related macular degeneration of left eye with subfoveal involvement Variable acuity related to variable perfusion of a small island of RPE remaining in the foveal region, approximately 175 m in size total      ICD-10-CM   1. Advanced nonexudative age-related macular degeneration of left eye without subfoveal involvement  H35.3123 OCT, Retina - OU - Both Eyes  2. Left epiretinal membrane  H35.372   3. Exudative age-related macular degeneration of right eye with inactive choroidal neovascularization ([place])  H35.3212   4. Advanced nonexudative age-related macular degeneration of left eye with subfoveal involvement  H35.3124     1.  I explained to the patient that his condition is clinically overall the same.  No other therapy is appropriate at this and timing in either eye.  Will observe  2.  3.  Ophthalmic Meds Ordered this visit:  No orders of the defined types were placed in this encounter.      Return in about 6 months (around 06/20/2021) for DILATE OU, COLOR FP, OCT.  There are no Patient Instructions on file for this visit.   Explained the diagnoses, plan, and follow up with the patient and they expressed understanding.  Patient expressed understanding of the importance of proper follow up care.   Clent Demark Rankin M.D. Diseases & Surgery of the Retina and Vitreous [facility] 12/18/20     Abbreviations: M myopia (nearsighted); A astigmatism;  H hyperopia (farsighted); P presbyopia; Mrx spectacle prescription;  CTL contact lenses; OD right eye; OS left eye; OU both eyes  XT exotropia; ET esotropia; PEK punctate epithelial keratitis; PEE punctate epithelial erosions; DES dry eye syndrome; MGD meibomian gland dysfunction; ATs artificial tears; PFAT's preservative free artificial tears; [place] nuclear sclerotic cataract; PSC posterior subcapsular cataract; ERM epi-retinal membrane; PVD posterior vitreous detachment; RD retinal detachment; DM diabetes mellitus; DR diabetic retinopathy; NPDR non-proliferative diabetic retinopathy; PDR proliferative diabetic retinopathy; CSME clinically significant macular edema; DME diabetic macular edema; dbh dot blot hemorrhages; CWS cotton wool spot; POAG primary open angle glaucoma; C/D cup-to-disc ratio; HVF humphrey visual field; GVF goldmann visual field; OCT optical coherence tomography; IOP intraocular pressure; BRVO Branch retinal vein occlusion; CRVO central retinal vein occlusion; CRAO central retinal artery occlusion; BRAO branch retinal artery occlusion; RT retinal tear; SB scleral buckle; PPV pars plana vitrectomy; VH Vitreous hemorrhage; PRP panretinal laser photocoagulation; IVK intravitreal kenalog; VMT vitreomacular traction; MH Macular hole;  NVD neovascularization of the disc; NVE neovascularization elsewhere; AREDS age related eye disease study; ARMD age related macular degeneration; POAG primary open angle glaucoma; EBMD epithelial/anterior basement membrane dystrophy; ACIOL anterior chamber intraocular lens; IOL intraocular lens; PCIOL posterior chamber intraocular lens; Phaco/IOL phacoemulsification with intraocular lens placement; [place] photorefractive keratectomy; LASIK laser assisted in situ keratomileusis; HTN hypertension; DM diabetes mellitus; COPD chronic obstructive pulmonary disease

## 2020-12-18 NOTE — Assessment & Plan Note (Signed)
Not active, no impact on acuity observe

## 2020-12-18 NOTE — Assessment & Plan Note (Signed)
Variable acuity related to variable perfusion of a small island of RPE remaining in the foveal region, approximately 175 m in size total

## 2020-12-18 NOTE — Assessment & Plan Note (Signed)
No signs of active CNVM

## 2020-12-28 DIAGNOSIS — N184 Chronic kidney disease, stage 4 (severe): Secondary | ICD-10-CM | POA: Diagnosis not present

## 2020-12-28 DIAGNOSIS — I129 Hypertensive chronic kidney disease with stage 1 through stage 4 chronic kidney disease, or unspecified chronic kidney disease: Secondary | ICD-10-CM | POA: Diagnosis not present

## 2020-12-28 DIAGNOSIS — M1000 Idiopathic gout, unspecified site: Secondary | ICD-10-CM | POA: Diagnosis not present

## 2020-12-30 ENCOUNTER — Other Ambulatory Visit: Payer: Self-pay | Admitting: Adult Health

## 2020-12-30 DIAGNOSIS — I10 Essential (primary) hypertension: Secondary | ICD-10-CM

## 2021-01-03 ENCOUNTER — Telehealth: Payer: Self-pay | Admitting: Pharmacist

## 2021-01-03 NOTE — Chronic Care Management (AMB) (Signed)
I spoke with the patient about his upcoming appointment on 01/04/2021 @ 10:00 am with the clinical pharmacist. He was asked to please have all medication on hand to review with the pharmacist. He confirmed the appointment.   Maia Breslow, [place] [phone]

## 2021-01-04 ENCOUNTER — Ambulatory Visit (INDEPENDENT_AMBULATORY_CARE_PROVIDER_SITE_OTHER): Payer: Medicare HMO | Admitting: Pharmacist

## 2021-01-04 DIAGNOSIS — E781 Pure hyperglyceridemia: Secondary | ICD-10-CM | POA: Diagnosis not present

## 2021-01-04 DIAGNOSIS — I10 Essential (primary) hypertension: Secondary | ICD-10-CM

## 2021-01-04 NOTE — Progress Notes (Signed)
Chronic Care Management Pharmacy Note  01/09/2021 Name:  [name] MRN:  [id number] DOB:  May 01, 1932  Subjective: Shane [name] is an 87 y.o. year old male who is a primary patient of Dorothyann Peng, NP.  The CCM team was consulted for assistance with disease management and care coordination needs.    Engaged with patient by telephone for follow up visit in response to provider referral for pharmacy case management and/or care coordination services.   Consent to Services:  The patient was given information about Chronic Care Management services, agreed to services, and gave verbal consent prior to initiation of services.  Please see initial visit note for detailed documentation.   Patient Care Team: Dorothyann Peng, NP as PCP - General (Family Medicine) Viona Gilmore, [facility] as Pharmacist (Pharmacist) [place], [place] (Nephrology)  Recent office visits: 10/18/20 Ofilia Neas, LPN: Patient presented for AWV.  10/05/20 Dorothyann Peng, NP: Patient presented for annual exam. Prescribed simvastatin 10 mg daily.  Recent consult visits: 12/18/20 Deloria Lair, MD (ophthalmology): Patient presented for retina follow up.  12/06/20 Curt Bears, MD (oncology): Patient presented for MGUS follow up. No changes made.  11/16/20 Curt Bears, MD (oncology): Patient presented for MGUS follow up. Concerns for multiple myeloma and plan for full work up.  09/20/20 Jeanett Schlein, MD (dermatology): Unable to access notes.  03/09/20 Wilfrid Lund, MD (gastroenterology): Patient presented for follow up for colitis. Patient seems to be doing well after tapering budesonide. No upper digestive symptoms and plan to decrease omeprazole to 3 times a week.   Hospital visits: None in previous 6 months  Objective:  Lab Results  Component Value Date   CREATININE 2.04 (H) 11/16/2020   BUN 43 (H) 11/16/2020   GFR 28.84 (L) 10/05/2020   GFRNONAA 31 (L) 11/16/2020   GFRAA 40 10/27/2020   NA 139  11/16/2020   K 4.2 11/16/2020   CALCIUM 9.1 11/16/2020   CO2 22 11/16/2020   GLUCOSE 94 11/16/2020    Lab Results  Component Value Date/Time   GFR 28.84 (L) 10/05/2020 09:32 AM   GFR 36.04 (L) 06/29/2019 10:08 AM    Last diabetic Eye exam:  Lab Results  Component Value Date/Time   HMDIABEYEEXA No Retinopathy 05/03/2014 12:00 AM   HMDIABEYEEXA No Retinopathy 05/03/2014 12:00 AM    Last diabetic Foot exam:  Lab Results  Component Value Date/Time   HMDIABFOOTEX done 05/05/2014 12:00 AM     Lab Results  Component Value Date   CHOL 182 10/05/2020   HDL 47.30 10/05/2020   LDLCALC 110 (H) 10/05/2020   LDLDIRECT 102.7 05/16/2008   TRIG 125.0 10/05/2020   CHOLHDL 4 10/05/2020    Hepatic Function Latest Ref Rng & Units 11/16/2020 10/27/2020 10/05/2020  Total Protein 6.5 - 8.1 g/dL 6.7 - 6.3  Albumin 3.5 - 5.0 g/dL 3.6 4.1 3.9  AST 15 - 41 U/L 20 - 18  ALT 0 - 44 U/L 11 - 11  Alk Phosphatase 38 - 126 U/L 24(L) - 26(L)  Total Bilirubin 0.3 - 1.2 mg/dL 0.8 - 0.7  Bilirubin, Direct 0.0 - 0.3 mg/dL - - -    Lab Results  Component Value Date/Time   TSH 3.57 10/05/2020 09:32 AM   TSH 2.26 06/16/2019 01:28 PM    CBC Latest Ref Rng & Units 12/04/2020 11/16/2020 10/27/2020  WBC 4.0 - 10.5 K/uL 9.4 10.3 -  Hemoglobin 13.0 - 17.0 g/dL 13.3 13.2 13.1(A)  Hematocrit 39.0 - 52.0 % 41.4 40.6 -  Platelets  150 - 400 K/uL 169 169 -    No results found for: VD25OH  Clinical ASCVD: No  The ASCVD Risk score Mikey Bussing DC Jr., et al., 2013) failed to calculate for the following reasons:   The 2013 ASCVD risk score is only valid for ages 82 to 63    Depression screen PHQ 2/9 10/18/2020 10/05/2020 09/21/2019  Decreased Interest 0 0 0  Down, Depressed, Hopeless 0 0 0  PHQ - 2 Score 0 0 0  Altered sleeping 0 - -  Tired, decreased energy 0 - -  Change in appetite 0 - -  Feeling bad or failure about yourself  0 - -  Trouble concentrating 0 - -  Moving slowly or fidgety/restless 0 - -  Suicidal  thoughts 0 - -  PHQ-9 Score 0 - -  Difficult doing work/chores Not difficult at all - -      Social History   Tobacco Use  Smoking Status Former Smoker  . Years: 35.00  . Types: Cigarettes  Smokeless Tobacco Never Used  Tobacco Comment   quit late 1980's   BP Readings from Last 3 Encounters:  01/09/21 (!) 142/90  12/06/20 (!) 186/80  12/04/20 (!) 158/73   Pulse Readings from Last 3 Encounters:  01/09/21 74  12/06/20 62  12/04/20 (!) 55   Wt Readings from Last 3 Encounters:  01/09/21 196 lb (88.9 kg)  12/06/20 198 lb 4.8 oz (89.9 kg)  11/16/20 201 lb 1.6 oz (91.2 kg)   BMI Readings from Last 3 Encounters:  01/09/21 28.12 kg/m  12/06/20 28.45 kg/m  11/16/20 28.85 kg/m    Assessment/Interventions: Review of patient past medical history, allergies, medications, health status, including review of consultants reports, laboratory and other test data, was performed as part of comprehensive evaluation and provision of chronic care management services.   SDOH:  (Social Determinants of Health) assessments and interventions performed: No  SDOH Screenings   Alcohol Screen: Low Risk   . Last Alcohol Screening Score (AUDIT): 0  Depression (PHQ2-9): Low Risk   . PHQ-2 Score: 0  Financial Resource Strain: Low Risk   . Difficulty of Paying Living Expenses: Not hard at all  Food Insecurity: Not on file  Housing: Low Risk   . Last Housing Risk Score: 0  Physical Activity: Inactive  . Days of Exercise per Week: 0 days  . Minutes of Exercise per Session: 0 min  Social Connections: Moderately Integrated  . Frequency of Communication with Friends and Family: Twice a week  . Frequency of Social Gatherings with Friends and Family: Once a week  . Attends Religious Services: More than 4 times per year  . Active Member of Clubs or Organizations: No  . Attends Archivist Meetings: Never  . Marital Status: Married  Stress: No Stress Concern Present  . Feeling of Stress :  Not at all  Tobacco Use: Medium Risk  . Smoking Tobacco Use: Former Smoker  . Smokeless Tobacco Use: Never Used  Transportation Needs: No Transportation Needs  . Lack of Transportation (Medical): No  . Lack of Transportation (Non-Medical): No    CCM Care Plan  No Known Allergies  Medications Reviewed Today    Reviewed by Dorothyann Peng, NP (Nurse Practitioner) on 01/09/21 at 1537  Med List Status: <None>  Medication Order Taking? Sig Documenting Provider Last Dose Status Informant  acetaminophen (TYLENOL) 500 MG tablet [id number] Yes Take 500-1,000 mg by mouth every 6 (six) hours as needed (for pain). [provider]  Taking Active Self  allopurinol (ZYLOPRIM) 300 MG tablet [id number] Yes Take 1 tablet (300 mg total) by mouth daily. Nafziger, Tommi Rumps, NP Taking Active   atenolol (TENORMIN) 25 MG tablet [id number] Yes Take 1 tablet (25 mg total) by mouth daily. Nafziger, Tommi Rumps, NP Taking Active   budesonide (ENTOCORT EC) 3 MG 24 hr capsule [id number] Yes Take 3 capsules (9 mg total) by mouth daily. Doran Stabler, MD Taking Active   chlorthalidone (HYGROTON) 25 MG tablet [id number] Yes Take 0.5 tablets (12.5 mg total) by mouth daily. Nafziger, Tommi Rumps, NP Taking Active   finasteride (PROSCAR) 5 MG tablet [id number] Yes Take 1 tablet (5 mg total) by mouth daily. Dorothyann Peng, NP Taking Active         Discontinued 01/09/21 1527   lisinopril (ZESTRIL) 5 MG tablet [id number] Yes Take 1 tablet (5 mg total) by mouth in the morning and at bedtime. Nafziger, Tommi Rumps, NP  Active   Multiple Vitamins-Minerals (PRESERVISION AREDS 2) CAPS [id number] Yes Take 1 capsule by mouth 2 (two) times daily. [provider] Taking Active Self  simvastatin (ZOCOR) 10 MG tablet [id number] Yes Take 1 tablet (10 mg total) by mouth at bedtime. Dorothyann Peng, NP Taking Active           Patient Active Problem List   Diagnosis Date Noted  . MGUS (monoclonal gammopathy of unknown significance) 11/16/2020  .  Advanced nonexudative age-related macular degeneration of left eye with subfoveal involvement 06/19/2020  . Left epiretinal membrane 06/19/2020  . Advanced nonexudative age-related macular degeneration of right eye with subfoveal involvement 06/19/2020  . 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 Chronic kidney disease (CKD), stage III (moderate) ([place]) 06/16/2019  . Clostridium difficile infection   . Hypertriglyceridemia 12/01/2015  . EXTRINSIC ASTHMA, UNSPECIFIED 01/11/2009  . SPINAL STENOSIS 05/24/2008  . PROSTATE SPECIFIC ANTIGEN, ELEVATED 05/24/2008  . OBESITY 08/13/2006  . ANEMIA-IRON DEFICIENCY 08/13/2006  . Essential hypertension 08/13/2006  . ALLERGIC RHINITIS 08/13/2006  . GERD 08/13/2006  . HIATAL HERNIA 08/13/2006  . DEGENERATIVE JOINT DISEASE 08/13/2006    Immunization History  Administered Date(s) Administered  . Fluad Quad(high Dose 65+) 06/16/2019, 08/10/2020  . Influenza Split 08/13/2011, 09/16/2012  . Influenza Whole 07/31/2007, 06/30/2008, 07/14/2009  . Influenza, High Dose Seasonal PF 08/12/2013, 08/03/2014, 07/19/2015, 06/18/2016, 07/29/2017, 06/09/2018  . PFIZER(Purple Top)SARS-COV-2 Vaccination 10/27/2019, 11/17/2019, 10/10/2020  . Pneumococcal Conjugate-13 02/16/2014  . Pneumococcal Polysaccharide-23 10/01/1999, 08/23/2009  . Td 10/01/2003  . Tetanus 02/16/2014  . Zoster 10/16/2012    Conditions to be addressed/monitored:  Hypertension, Hyperlipidemia, GERD, BPH, Gout and colitis, pain  Care Plan : [place]  Updates made by Viona Gilmore, [place] since 01/09/2021 12:00 AM    Problem: Problem: Hypertension, Hyperlipidemia, GERD, BPH, Gout and colitis, pain     Long-Range Goal: Patient-Specific Goal   Start Date: 01/04/2021  Expected End Date: 01/04/2022  This Visit's Progress: On track  Priority: High  Note:   Current Barriers:  . Unable to independently monitor  therapeutic efficacy . Unable to achieve control of blood pressure   Pharmacist Clinical Goal(s):  Marland Kitchen Patient will achieve adherence to monitoring guidelines and medication adherence to achieve therapeutic efficacy . achieve control of blood pressure as evidenced by blood pressure readings  through collaboration with PharmD and provider.   Interventions: . 1:1 collaboration with Dorothyann Peng, NP regarding development and update of comprehensive plan of care as evidenced by provider attestation and co-signature .  Inter-disciplinary care team collaboration (see longitudinal plan of care) . Comprehensive medication review performed; medication list updated in electronic medical record  Hypertension (BP goal <140/90) -Uncontrolled -Current treatment:  Atenolol 25 mg 1 tablet daily  Chlorthalidone 25 mg 1/2 tablet daily  Lisinopril 5 mg 1 tablet daily -Medications previously tried:  n/a -Current home readings: 170-180s (just bought a new arm cuff) -Current dietary habits: does salt his food; discussed limiting salt intake -Current exercise habits: no change in activity level -Denies hypotensive/hypertensive symptoms -Educated on Daily salt intake goal < 2300 mg; Exercise goal of 150 minutes per week; Importance of home blood pressure monitoring; Proper BP monitoring technique; -Counseled to monitor BP at home a few times a week, document, and provide log at future appointments -Counseled on diet and exercise extensively Recommended to continue current medication Recommended keeping a log and bringing his BP cuff to next appointment to compare readings. Scheduled in person visit with PCP.  Hyperlipidemia: (LDL goal < 100) -Uncontrolled -Current treatment: . Simvastatin 10 mg 1 tablet daily -Medications previously tried: none  -Current dietary patterns: did not discuss -Current exercise habits: no change in activity level -Educated on Cholesterol goals;  Benefits of statin for  ASCVD risk reduction; Importance of limiting foods high in cholesterol; Exercise goal of 150 minutes per week; -Counseled on diet and exercise extensively Recommended to continue current medication  Gout (Goal: prevent flare ups and uric acid level < 6) -Controlled -Current treatment  . Allopurinol 300 mg 1 tablet daily -Medications previously tried: none  -Recommended to continue current medication  BPH (Goal: minimize symptoms of enlarged prostate) -Controlled -Current treatment  . Finasteride 5 mg 1 tablet daily -Medications previously tried: dutasteride (cost)  -Recommended to continue current medication  Colitis (Goal: minimize symptoms) -Controlled -Current treatment  . Budesonide 3 mg 3 capsules daily -Medications previously tried: none  -Recommended to continue current medication  GERD/hiatal (Goal: minimize symptoms) -Controlled -Current treatment  . No medications -Medications previously tried: omeprazole (risk of C diff infection)  -Recommended to continue as is   Health Maintenance -Vaccine gaps: shingrix -Current therapy:  . Acetaminophen 500 mg 1 tablet as needed . Preservision Areds 1 capsule twice daily -Educated on Cost vs benefit of each product must be carefully weighed by individual consumer -Patient is satisfied with current therapy and denies issues -Recommended to continue current medication  Patient Goals/Self-Care Activities . Patient will:  - take medications as prescribed check blood pressure a few times weekly, document, and provide at future appointments target a minimum of 150 minutes of moderate intensity exercise weekly engage in dietary modifications by limiting salt intake  Follow Up Plan: Telephone follow up appointment with care management team member scheduled for: 4 months      Medication Assistance: None required.  Patient affirms current coverage meets needs.  Patient's preferred pharmacy is:  [facility] -  [place], [place] - [address] [place] [address] Phone: [phone] Fax: [phone]  Uses pill box? No - adherence packaging Pt endorses 100% compliance  We discussed: Benefits of medication synchronization, packaging and delivery as well as enhanced pharmacist oversight with [facility]. Patient decided to: Utilize [facility] pharmacy for medication synchronization, packaging and delivery  Care Plan and Follow Up Patient Decision:  Patient agrees to Care Plan and Follow-up.  Plan: Telephone follow up appointment with care management team member scheduled for:  4 months  Jeni Salles, PharmD [facility] Clinical Pharmacist [place] at  [place] [phone]

## 2021-01-09 ENCOUNTER — Ambulatory Visit (INDEPENDENT_AMBULATORY_CARE_PROVIDER_SITE_OTHER): Payer: Medicare HMO | Admitting: Adult Health

## 2021-01-09 ENCOUNTER — Encounter: Payer: Self-pay | Admitting: Adult Health

## 2021-01-09 ENCOUNTER — Other Ambulatory Visit: Payer: Self-pay

## 2021-01-09 VITALS — BP 142/90 | HR 74 | Temp 98.0°F | Wt 196.0 lb

## 2021-01-09 DIAGNOSIS — I10 Essential (primary) hypertension: Secondary | ICD-10-CM

## 2021-01-09 MED ORDER — LISINOPRIL 5 MG PO TABS: 5.0000 mg | ORAL_TABLET | Freq: Two times a day (BID) | ORAL | 0 refills | Status: DC

## 2021-01-09 NOTE — Patient Instructions (Addendum)
I think we need to even out of your blood pressure a little more.   I am going to have you start taking Lisinopril 5 mg in the morning and 5 mg in the evening.   Please let me

## 2021-01-09 NOTE — Patient Instructions (Addendum)
Hi Shane [name],  It was great to get to speak with you again! Below is a summary of some of the topics we discussed.   Please reach out to me if you have any questions or need anything before our follow up!  Best, Maddie  Jeni Salles, PharmD, [place] at [place]    Visit Information  Goals Addressed   None    Patient Care Plan: CCM Pharmacy Care Plan    Problem Identified: Problem: Hypertension, Hyperlipidemia, GERD, BPH, Gout and colitis, pain     Long-Range Goal: Patient-Specific Goal   Start Date: 01/04/2021  Expected End Date: 01/04/2022  This Visit's Progress: On track  Priority: High  Note:   Current Barriers:  . Unable to independently monitor therapeutic efficacy . Unable to achieve control of blood pressure   Pharmacist Clinical Goal(s):  Marland Kitchen Patient will achieve adherence to monitoring guidelines and medication adherence to achieve therapeutic efficacy . achieve control of blood pressure as evidenced by blood pressure readings  through collaboration with PharmD and provider.   Interventions: . 1:1 collaboration with Dorothyann Peng, NP regarding development and update of comprehensive plan of care as evidenced by provider attestation and co-signature . Inter-disciplinary care team collaboration (see longitudinal plan of care) . Comprehensive medication review performed; medication list updated in electronic medical record  Hypertension (BP goal <140/90) -Uncontrolled -Current treatment:  Atenolol 25 mg 1 tablet daily  Chlorthalidone 25 mg 1/2 tablet daily  Lisinopril 5 mg 1 tablet daily -Medications previously tried:  n/a -Current home readings: 170-180s (just bought a new arm cuff) -Current dietary habits: does salt his food; discussed limiting salt intake -Current exercise habits: no change in activity level -Denies hypotensive/hypertensive symptoms -Educated on Daily salt intake goal < 2300 mg; Exercise  goal of 150 minutes per week; Importance of home blood pressure monitoring; Proper BP monitoring technique; -Counseled to monitor BP at home a few times a week, document, and provide log at future appointments -Counseled on diet and exercise extensively Recommended to continue current medication Recommended keeping a log and bringing his BP cuff to next appointment to compare readings. Scheduled in person visit with PCP.  Hyperlipidemia: (LDL goal < 100) -Uncontrolled -Current treatment: . Simvastatin 10 mg 1 tablet daily -Medications previously tried: none  -Current dietary patterns: did not discuss -Current exercise habits: no change in activity level -Educated on Cholesterol goals;  Benefits of statin for ASCVD risk reduction; Importance of limiting foods high in cholesterol; Exercise goal of 150 minutes per week; -Counseled on diet and exercise extensively Recommended to continue current medication  Gout (Goal: prevent flare ups and uric acid level < 6) -Controlled -Current treatment  . Allopurinol 300 mg 1 tablet daily -Medications previously tried: none  -Recommended to continue current medication  BPH (Goal: minimize symptoms of enlarged prostate) -Controlled -Current treatment  . Finasteride 5 mg 1 tablet daily -Medications previously tried: dutasteride (cost)  -Recommended to continue current medication  Colitis (Goal: minimize symptoms) -Controlled -Current treatment  . Budesonide 3 mg 3 capsules daily -Medications previously tried: none  -Recommended to continue current medication  GERD/hiatal (Goal: minimize symptoms) -Controlled -Current treatment  . No medications -Medications previously tried: omeprazole (risk of C diff infection)  -Recommended to continue as is   Health Maintenance -Vaccine gaps: shingrix -Current therapy:  . Acetaminophen 500 mg 1 tablet as needed . Preservision Areds 1 capsule twice daily -Educated on Cost vs benefit of each  product must be carefully weighed by  individual consumer -Patient is satisfied with current therapy and denies issues -Recommended to continue current medication  Patient Goals/Self-Care Activities . Patient will:  - take medications as prescribed check blood pressure a few times weekly, document, and provide at future appointments target a minimum of 150 minutes of moderate intensity exercise weekly engage in dietary modifications by limiting salt intake  Follow Up Plan: Telephone follow up appointment with care management team member scheduled for: 4 months       Patient verbalizes understanding of instructions provided today and agrees to view in [place].  Telephone follow up appointment with pharmacy team member scheduled for: 4 months  Viona Gilmore, [facility]  How to Take Your Blood Pressure Blood pressure measures how strongly your blood is pressing against the walls of your arteries. Arteries are blood vessels that carry blood from your heart throughout your body. You can take your blood pressure at home with a machine. You may need to check your blood pressure at home:  To check if you have high blood pressure (hypertension).  To check your blood pressure over time.  To make sure your blood pressure medicine is working. Supplies needed:  Blood pressure machine, or monitor.  Dining room chair to sit in.  Table or desk.  Small notebook.  Pencil or pen. How to prepare Avoid these things for 30 minutes before checking your blood pressure:  Having drinks with caffeine in them, such as coffee or tea.  Drinking alcohol.  Eating.  Smoking.  Exercising. Do these things five minutes before checking your blood pressure:  Go to the bathroom and pee (urinate).  Sit in a dining chair. Do not sit in a soft couch or an armchair.  Be quiet. Do not talk. How to take your blood pressure Follow the instructions that came with your machine. If you have a digital blood  pressure monitor, these may be the instructions: 1. Sit up straight. 2. Place your feet on the floor. Do not cross your ankles or legs. 3. Rest your left arm at the level of your heart. You may rest it on a table, desk, or chair. 4. Pull up your shirt sleeve. 5. Wrap the blood pressure cuff around the upper part of your left arm. The cuff should be 1 inch (2.5 cm) above your elbow. It is best to wrap the cuff around bare skin. 6. Fit the cuff snugly around your arm. You should be able to place only one finger between the cuff and your arm. 7. Place the cord so that it rests in the bend of your elbow. 8. Press the power button. 9. Sit quietly while the cuff fills with air and loses air. 10. Write down the numbers on the screen. 11. Wait 2-3 minutes and then repeat steps 1-10.   What do the numbers mean? Two numbers make up your blood pressure. The first number is called systolic pressure. The second is called diastolic pressure. An example of a blood pressure reading is "120 over 80" (or 120/80). If you are an adult and do not have a medical condition, use this guide to find out if your blood pressure is normal: Normal  First number: below 120.  Second number: below 80. Elevated  First number: 120-129.  Second number: below 80. Hypertension stage 1  First number: 130-139.  Second number: 80-89. Hypertension stage 2  First number: 140 or above.  Second number: 79 or above. Your blood pressure is above normal even if only the  top or bottom number is above normal. Follow these instructions at home:  Check your blood pressure as often as your doctor tells you to.  Check your blood pressure at the same time every day.  Take your monitor to your next doctor's appointment. Your doctor will: ? Make sure you are using it correctly. ? Make sure it is working right.  Make sure you understand what your blood pressure numbers should be.  Tell your doctor if your medicine is causing  side effects.  Keep all follow-up visits as told by your doctor. This is important. General tips:  You will need a blood pressure machine, or monitor. Your doctor can suggest a monitor. You can buy one at a drugstore or online. When choosing one: ? Choose one with an arm cuff. ? Choose one that wraps around your upper arm. Only one finger should fit between your arm and the cuff. ? Do not choose one that measures your blood pressure from your wrist or finger. Where to find more information American Heart Association: www.heart.org Contact a doctor if:  Your blood pressure keeps being high. Get help right away if:  Your first blood pressure number is higher than 180.  Your second blood pressure number is higher than 120. Summary  Check your blood pressure at the same time every day.  Avoid caffeine, alcohol, smoking, and exercise for 30 minutes before checking your blood pressure.  Make sure you understand what your blood pressure numbers should be. This information is not intended to replace advice given to you by your health care provider. Make sure you discuss any questions you have with your health care provider. Document Revised: 09/10/2019 Document Reviewed: 09/10/2019 Elsevier Patient Education  2021 [employer].

## 2021-01-09 NOTE — Progress Notes (Signed)
Subjective:    Patient ID: Shane [name], male    DOB: [birth year], 87 y.o.   MRN: [id number]  HPI 87 year old male who  has a past medical history of Allergy, Anemia, iron deficiency, C. difficile diarrhea, Cancer ([place]), Cataract, DDD (degenerative disc disease), cervical, Dysplastic nevus of left upper extremity, Elevated PSA, Essential hypertension, Extrinsic asthma, GERD (gastroesophageal reflux disease), Gout, Hiatal hernia, Macular degeneration, Neuromuscular disorder ([place]), Pneumonia, and Spinal stenosis.  He presents to the office today for concern of increasing readings on his home blood pressure cuff as well as at other medical appointments.   He is currently prescribed atenolol 25 mg daily, chlorthalidone 12.5 mg, and lisinopril 5 mg.  When he was last seen in the office in January 2022 he was reporting readings at home consistently in the 120s to 130s over 70s to 80s.  He has been checking at home with reported readings between 137/76 and 177/93 with his BP cuff.  When he was seen by oncology earlier last month his blood pressure was elevated at 186/80 but when he was seen by [facility] a few weeks ago his blood pressure was 136 over 80s.  He denies any symptoms such as headache, lightheadedness, blurred vision, or syncopal episodes.  Today with his blood pressure cuff we got a reading of 147/90 and a manual blood pressure reading of 140/90  BP Readings from Last 10 Encounters:  01/09/21 (!) 142/90  12/06/20 (!) 186/80  12/04/20 (!) 158/73  11/16/20 (!) 171/63  10/05/20 130/86  03/09/20 122/62  03/07/20 (!) 144/88  02/22/20 (!) 152/90  02/15/20 (!) 175/100  08/02/19 138/80   Lab Results  Component Value Date   CREATININE 2.04 (H) 11/16/2020   BUN 43 (H) 11/16/2020   NA 139 11/16/2020   K 4.2 11/16/2020   CL 109 11/16/2020   CO2 22 11/16/2020   Review of Systems See HPI   Past Medical History:  Diagnosis Date  . Allergy   . Anemia, iron deficiency   . C.  difficile diarrhea   . 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 Cataract    bil cataracts removed  . DDD (degenerative disc disease), cervical   . Dysplastic nevus of left upper extremity   . Elevated PSA   . Essential hypertension   . Extrinsic asthma   . GERD (gastroesophageal reflux disease)    no recent  . Gout   . Hiatal hernia   . Macular degeneration   . Neuromuscular disorder ([place])    hiatal hernia  . Pneumonia    2014ish  . Spinal stenosis     Social History   Socioeconomic History  . Marital status: Married    Spouse name: Not on file  . Number of children: 3  . Years of education: Not on file  . Highest education level: Not on file  Occupational History  . Occupation: retired  Tobacco Use  . Smoking status: Former Smoker    Years: 35.00    Types: Cigarettes  . Smokeless tobacco: Never Used  . Tobacco comment: quit late 1980's  Vaping Use  . Vaping Use: Never used  Substance and Sexual Activity  . Alcohol use: No  . Drug use: No  . Sexual activity: Not on file  Other Topics Concern  . Not on file  Social History Narrative   Retired from [employer], educational    Married for 55 years    Three children all live locally  Likes to play golf and go to [employer].    Social Determinants of Health   Financial Resource Strain: Low Risk   . Difficulty of Paying Living Expenses: Not hard at all  Food Insecurity: Not on file  Transportation Needs: No Transportation Needs  . Lack of Transportation (Medical): No  . Lack of Transportation (Non-Medical): No  Physical Activity: Inactive  . Days of Exercise per Week: 0 days  . Minutes of Exercise per Session: 0 min  Stress: No Stress Concern Present  . Feeling of Stress : Not at all  Social Connections: Moderately Integrated  . Frequency of Communication with Friends and Family: Twice a week  . Frequency of Social Gatherings with Friends and Family: Once a week  . Attends Religious Services: More than 4 times per  year  . Active Member of Clubs or Organizations: No  . Attends Archivist Meetings: Never  . Marital Status: Married  Human resources officer Violence: Not At Risk  . Fear of Current or Ex-Partner: No  . Emotionally Abused: No  . Physically Abused: No  . Sexually Abused: No    Past Surgical History:  Procedure Laterality Date  . BACK SURGERY    . Bone Spur Right    Shoulder  . COLONOSCOPY    . COLONOSCOPY W/ POLYPECTOMY    . COLONOSCOPY WITH PROPOFOL N/A 11/19/2016   Procedure: COLONOSCOPY WITH PROPOFOL;  Surgeon: Doran Stabler, MD;  Location: [facility];  Service: Gastroenterology;  Laterality: N/A;  . FECAL TRANSPLANT N/A 11/19/2016   Procedure: FECAL TRANSPLANT;  Surgeon: Doran Stabler, MD;  Location: [facility];  Service: Gastroenterology;  Laterality: N/A;  . MOUTH SURGERY     2 teeth removed resulting in a dry socket in one of them  . POLYPECTOMY    . SKIN CANCER EXCISION      Family History  Problem Relation Age of Onset  . Multiple myeloma Brother   . Prostate cancer Brother   . Hypertension Father        ?  . Lung cancer Sister   . Colon cancer Neg Hx   . Stomach cancer Neg Hx   . Esophageal cancer Neg Hx   . Rectal cancer Neg Hx     No Known Allergies  Current Outpatient Medications on File Prior to Visit  Medication Sig Dispense Refill  . acetaminophen (TYLENOL) 500 MG tablet Take 500-1,000 mg by mouth every 6 (six) hours as needed (for pain).    Marland Kitchen allopurinol (ZYLOPRIM) 300 MG tablet Take 1 tablet (300 mg total) by mouth daily. 90 tablet 3  . atenolol (TENORMIN) 25 MG tablet Take 1 tablet (25 mg total) by mouth daily. 90 tablet 3  . budesonide (ENTOCORT EC) 3 MG 24 hr capsule Take 3 capsules (9 mg total) by mouth daily. 270 capsule 1  . chlorthalidone (HYGROTON) 25 MG tablet Take 0.5 tablets (12.5 mg total) by mouth daily. 45 tablet 3  . finasteride (PROSCAR) 5 MG tablet Take 1 tablet (5 mg total) by mouth daily. 90 tablet 3  . lisinopril  (ZESTRIL) 5 MG tablet TAKE ONE TABLET BY MOUTH EVERY MORNING 90 tablet 0  . Multiple Vitamins-Minerals (PRESERVISION AREDS 2) CAPS Take 1 capsule by mouth 2 (two) times daily.    . simvastatin (ZOCOR) 10 MG tablet Take 1 tablet (10 mg total) by mouth at bedtime. 90 tablet 3   No current facility-administered medications on file prior to visit.    BP Marland Kitchen)  142/90 (BP Location: Left Arm, Patient Position: Sitting, Cuff Size: Normal)   Pulse 74   Temp 98 F (36.7 C) (Oral)   Wt 196 lb (88.9 kg)   SpO2 93%   BMI 28.12 kg/m       Objective:   Physical Exam Vitals and nursing note reviewed.  Constitutional:      Appearance: Normal appearance.  Cardiovascular:     Rate and Rhythm: Normal rate and regular rhythm.     Pulses: Normal pulses.     Heart sounds: Normal heart sounds.  Pulmonary:     Effort: Pulmonary effort is normal.     Breath sounds: Normal breath sounds.  Abdominal:     General: Abdomen is flat.     Palpations: Abdomen is soft.  Musculoskeletal:        General: Normal range of motion.  Skin:    General: Skin is warm and dry.     Capillary Refill: Capillary refill takes less than 2 seconds.  Neurological:     General: No focal deficit present.     Mental Status: He is alert and oriented to person, place, and time.  Psychiatric:        Mood and Affect: Mood normal.        Behavior: Behavior normal.        Thought Content: Thought content normal.        Judgment: Judgment normal.       Assessment & Plan:  1. Essential hypertension - Will increase lisinopril slightly from 5 mg daily to 5 mg BID to see if we can get his BP readings more consistent. He will follow up with me via mychart in 2-3 weeks or come in sooner if BP continues to trend up - lisinopril (ZESTRIL) 5 MG tablet; Take 1 tablet (5 mg total) by mouth in the morning and at bedtime.  Dispense: 180 tablet; Refill: 0   Dorothyann Peng, NP

## 2021-01-10 ENCOUNTER — Ambulatory Visit: Payer: Medicare HMO | Admitting: Adult Health

## 2021-01-17 ENCOUNTER — Telehealth: Payer: Medicare HMO

## 2021-01-18 ENCOUNTER — Encounter: Payer: Self-pay | Admitting: Adult Health

## 2021-03-23 DIAGNOSIS — D0462 Carcinoma in situ of skin of left upper limb, including shoulder: Secondary | ICD-10-CM | POA: Diagnosis not present

## 2021-03-23 DIAGNOSIS — C44311 Basal cell carcinoma of skin of nose: Secondary | ICD-10-CM | POA: Diagnosis not present

## 2021-03-23 DIAGNOSIS — L821 Other seborrheic keratosis: Secondary | ICD-10-CM | POA: Diagnosis not present

## 2021-03-30 ENCOUNTER — Other Ambulatory Visit: Payer: Self-pay | Admitting: Adult Health

## 2021-03-30 ENCOUNTER — Telehealth: Payer: Self-pay | Admitting: Pharmacist

## 2021-03-30 DIAGNOSIS — I10 Essential (primary) hypertension: Secondary | ICD-10-CM

## 2021-03-30 NOTE — Chronic Care Management (AMB) (Signed)
Chronic Care Management Pharmacy Assistant   Name: Shane [name]  MRN: [id number] DOB: 01/20/1932  Reason for Encounter: Medication Review/ Medication Coordination Call.    Recent office visits:  01/09/21 Dorothyann Peng NP (PCP) - seen for hypertension. Increased lisinopril from 5mg  once daily to 5mg  twice daily. Follow up via mychart in 2-3 weeks.   Recent consult visits:  None.   Hospital visits:  None in previous 6 months  Medications: Outpatient Encounter Medications as of 03/30/2021  Medication Sig   acetaminophen (TYLENOL) 500 MG tablet Take 500-1,000 mg by mouth every 6 (six) hours as needed (for pain).   allopurinol (ZYLOPRIM) 300 MG tablet Take 1 tablet (300 mg total) by mouth daily.   atenolol (TENORMIN) 25 MG tablet Take 1 tablet (25 mg total) by mouth daily.   budesonide (ENTOCORT EC) 3 MG 24 hr capsule Take 3 capsules (9 mg total) by mouth daily.   chlorthalidone (HYGROTON) 25 MG tablet Take 0.5 tablets (12.5 mg total) by mouth daily.   finasteride (PROSCAR) 5 MG tablet Take 1 tablet (5 mg total) by mouth daily.   lisinopril (ZESTRIL) 5 MG tablet TAKE ONE TABLET BY MOUTH EVERY MORNING and TAKE ONE TABLET BY MOUTH EVERYDAY AT BEDTIME   Multiple Vitamins-Minerals (PRESERVISION AREDS 2) CAPS Take 1 capsule by mouth 2 (two) times daily.   simvastatin (ZOCOR) 10 MG tablet Take 1 tablet (10 mg total) by mouth at bedtime.   No facility-administered encounter medications on file as of 03/30/2021.    Reviewed chart for medication changes ahead of medication coordination call.  No OVs, Consults, or hospital visits since last care coordination call/Pharmacist visit. (If appropriate, list visit date, provider name)  No medication changes indicated OR if recent visit, treatment plan here.  BP Readings from Last 3 Encounters:  01/09/21 (!) 142/90  12/06/20 (!) 186/80  12/04/20 (!) 158/73    No results found for: HGBA1C   Patient obtains medications through Adherence  Packaging  90 Days   Last adherence delivery included:  Simvastatin 10mg  - take one tablet by mouth at bedtime. Lisinopril 5mg  - take one tablet with breakfast and one tablet at bedtime daily.  Allopurinol 300mg  - take daily with breakfast. Atenolol 25mg  - take daily with breakfast. Budesonide ER 3mg  - 3 capsules with breakfast daily. Chlorthalidone 25mg  - take 0.5 tablet with breakfast daily.  Finasteride 5mg  - take one tablet at bedtime daily.   Patient declined (meds) last month due to PRN use/additional supply on hand. None.   Patient is due for adherence delivery on: 04/10/21. Called patient and reviewed medications and coordinated delivery.  This delivery to include: Atenolol 25mg  - take one tablet with breakfast daily. Simvastatin 10mg  - take one tablet by mouth at bedtime. Allopurinol 300mg  - take one tablet by mouth with breakfast daily. Chlorthalidone 25mg  - take 0.5 tablet with breakfast daily.  Finasteride 5mg  - take one tablet at bedtime daily.  Lisinopril 5mg  - take one tablet with breakfast and one tablet at bedtime daily.   Patient declined the following medications due to last filled last month for 90 days on 03/19/21.  Budesonide 3mg  ER - take 3 capsules with breakfast daily.   Refills requested for lisinopril by [facility].  Confirmed delivery date of 04/10/21, advised patient that pharmacy will contact them the morning of delivery.  Star Rating Drugs:  Lisinopril 5mg  - last filled on 01/09/21 90DS at [facility] Simvastatin 10mg  - last filled on 01/09/21 90DS at [facility]  [place]  Clinical Pharmacist Assistant [phone]

## 2021-04-25 ENCOUNTER — Encounter: Payer: Self-pay | Admitting: Internal Medicine

## 2021-04-25 ENCOUNTER — Telehealth (INDEPENDENT_AMBULATORY_CARE_PROVIDER_SITE_OTHER): Payer: Medicare HMO | Admitting: Internal Medicine

## 2021-04-25 VITALS — Ht 70.0 in | Wt 196.0 lb

## 2021-04-25 DIAGNOSIS — U071 COVID-19: Secondary | ICD-10-CM | POA: Diagnosis not present

## 2021-04-25 DIAGNOSIS — J988 Other specified respiratory disorders: Secondary | ICD-10-CM

## 2021-04-25 MED ORDER — BENZONATATE 100 MG PO CAPS: 100.0000 mg | ORAL_CAPSULE | Freq: Three times a day (TID) | ORAL | 0 refills | Status: DC | PRN

## 2021-04-25 MED ORDER — ALBUTEROL SULFATE HFA 108 (90 BASE) MCG/ACT IN AERS: 2.0000 | INHALATION_SPRAY | Freq: Four times a day (QID) | RESPIRATORY_TRACT | 1 refills | Status: AC | PRN

## 2021-04-25 NOTE — Progress Notes (Signed)
Virtual Visit via Video Note  I connected withNAME@ on 04/25/21 at 12:30 PM EDT by a video enabled telemedicine application and verified that I am speaking with the correct person using two identifiers. Location patient: home Location provider:work office Persons participating in the virtual visit: patient, provider  [place] national recommendations  regarding COVID 19 pandemic   video visit is advised over in office visit for this patient.  Patient aware  of the limitations of evaluation and management by telemedicine and  availability of in person appointments. and agreed to proceed.   HPI: Shane [name] presents for video visit PCP appointment not available.  States that he was exposed to [place] from his daughter about the 15th of the 16th he tested positive for COVID on the 18th been feeling badly runny nose body aches etc. had improved until 3 to 4 days ago and now has a cough that is tight and feels like he needs to cough something up but it is thin mucus.  Denies shortness of breath in between coughing fits.  No vomiting diarrhea fever at this time.  His daughter and wife are basically better from the illness. Does not have a pulse ox at home to check oxygen level.  No vomiting or diarrhea at this time is not supposed to be on antibiotics and Lantus necessary because of his   Past hx of d ciff and lypnocytic colitis  Dr Loletha Carrow ROS: See pertinent positives and negatives per HPI.  Past Medical History:  Diagnosis Date   Allergy    Anemia, iron deficiency    C. difficile diarrhea    Cancer (HCC)    forearm-  squamous   Cataract    bil cataracts removed   DDD (degenerative disc disease), cervical    Dysplastic nevus of left upper extremity    Elevated PSA    Essential hypertension    Extrinsic asthma    GERD (gastroesophageal reflux disease)    no recent   Gout    Hiatal hernia    Macular degeneration    Neuromuscular disorder (HCC)    hiatal hernia   Pneumonia    2014ish    Spinal stenosis     Past Surgical History:  Procedure Laterality Date   BACK SURGERY     Bone Spur Right    Shoulder   COLONOSCOPY     COLONOSCOPY W/ POLYPECTOMY     COLONOSCOPY WITH PROPOFOL N/A 11/19/2016   Procedure: COLONOSCOPY WITH PROPOFOL;  Surgeon: Doran Stabler, MD;  Location: [facility];  Service: Gastroenterology;  Laterality: N/A;   FECAL TRANSPLANT N/A 11/19/2016   Procedure: FECAL TRANSPLANT;  Surgeon: Doran Stabler, MD;  Location: [facility];  Service: Gastroenterology;  Laterality: N/A;   MOUTH SURGERY     2 teeth removed resulting in a dry socket in one of them   POLYPECTOMY     SKIN CANCER EXCISION      Family History  Problem Relation Age of Onset   Multiple myeloma Brother    Prostate cancer Brother    Hypertension Father        ?   Lung cancer Sister    Colon cancer Neg Hx    Stomach cancer Neg Hx    Esophageal cancer Neg Hx    Rectal cancer Neg Hx     Social History   Tobacco Use   Smoking status: Former    Years: 35.00    Types: Cigarettes   Smokeless tobacco: Never  Tobacco comments:    quit late 1980's  Vaping Use   Vaping Use: Never used  Substance Use Topics   Alcohol use: No   Drug use: No      Current Outpatient Medications:    acetaminophen (TYLENOL) 500 MG tablet, Take 500-1,000 mg by mouth every 6 (six) hours as needed (for pain)., Disp: , Rfl:    albuterol (VENTOLIN HFA) 108 (90 Base) MCG/ACT inhaler, Inhale 2 puffs into the lungs every 6 (six) hours as needed for wheezing or shortness of breath., Disp: 1 each, Rfl: 1   allopurinol (ZYLOPRIM) 300 MG tablet, Take 1 tablet (300 mg total) by mouth daily., Disp: 90 tablet, Rfl: 3   atenolol (TENORMIN) 25 MG tablet, Take 1 tablet (25 mg total) by mouth daily., Disp: 90 tablet, Rfl: 3   benzonatate (TESSALON PERLES) 100 MG capsule, Take 1 capsule (100 mg total) by mouth 3 (three) times daily as needed for cough., Disp: 20 capsule, Rfl: 0   budesonide (ENTOCORT EC) 3 MG 24  hr capsule, Take 3 capsules (9 mg total) by mouth daily., Disp: 270 capsule, Rfl: 1   chlorthalidone (HYGROTON) 25 MG tablet, Take 0.5 tablets (12.5 mg total) by mouth daily., Disp: 45 tablet, Rfl: 3   finasteride (PROSCAR) 5 MG tablet, Take 1 tablet (5 mg total) by mouth daily., Disp: 90 tablet, Rfl: 3   lisinopril (ZESTRIL) 5 MG tablet, TAKE ONE TABLET BY MOUTH EVERY MORNING and TAKE ONE TABLET BY MOUTH EVERYDAY AT BEDTIME, Disp: 180 tablet, Rfl: 0   Multiple Vitamins-Minerals (PRESERVISION AREDS 2) CAPS, Take 1 capsule by mouth 2 (two) times daily., Disp: , Rfl:    simvastatin (ZOCOR) 10 MG tablet, Take 1 tablet (10 mg total) by mouth at bedtime., Disp: 90 tablet, Rfl: 3  EXAM: BP Readings from Last 3 Encounters:  01/09/21 (!) 142/90  12/06/20 (!) 186/80  12/04/20 (!) 158/73    VITALS per patient if applicable:  GENERAL: alert, oriented, appears well and in no acute distress not look dyspneic has normal color may be mild congestion no significant coughing during visit.  HEENT: atraumatic, conjunttiva clear, no obvious abnormalities on inspection of external nose and ears  NECK: normal movements of the head and neck  LUNGS: on inspection no signs of respiratory distress, breathing rate appears normal, no obvious gross SOB, gasping or wheezing  CV: no obvious cyanosis   PSYCH/NEURO: pleasant and cooperative, no obvious depression or anxiety, speech and thought processing grossly intact Lab Results  Component Value Date   WBC 9.4 12/04/2020   HGB 13.3 12/04/2020   HCT 41.4 12/04/2020   PLT 169 12/04/2020   GLUCOSE 94 11/16/2020   CHOL 182 10/05/2020   TRIG 125.0 10/05/2020   HDL 47.30 10/05/2020   LDLDIRECT 102.7 05/16/2008   LDLCALC 110 (H) 10/05/2020   ALT 11 11/16/2020   AST 20 11/16/2020   NA 139 11/16/2020   K 4.2 11/16/2020   CL 109 11/16/2020   CREATININE 2.04 (H) 11/16/2020   BUN 43 (H) 11/16/2020   CO2 22 11/16/2020   TSH 3.57 10/05/2020   PSA 2.85 06/16/2019    INR 1.1 12/04/2020    ASSESSMENT AND PLAN:  Discussed the following assessment and plan:    ICD-10-CM   1. Respiratory tract infection due to COVID-19 virus  U07.1    J98.8      High risk.  For complication currently is around day 12 of illness.  Denies significant shortness of breath but cough seems  to be more problematic although dry.  With his underlying history of asthma remotely we will add albuterol inhaler as a trial Tessalon Perles supportive.  Supportive care.  Low threshold to seek emergency room care.  If he gets short of breath dehydrated high fever. Unfortunately he presents at past via recommendations for intervention for antivirals. Counseled.  Keep [place] informed how doing let us know next week.  Agree antibiotic at this time not indicated.  Expectant management and discussion of plan and treatment with opportunity to ask questions and all were answered. The patient agreed with the plan and demonstrated an understanding of the instructions.   Advised to call back or seek an in-person evaluation if worsening  or having  further concerns . Return if symptoms worsen or fail to improve, for see text.   Shanon Ace, MD

## 2021-04-27 ENCOUNTER — Encounter (HOSPITAL_COMMUNITY): Payer: Self-pay

## 2021-04-27 ENCOUNTER — Emergency Department (HOSPITAL_COMMUNITY): Payer: Medicare HMO

## 2021-04-27 ENCOUNTER — Other Ambulatory Visit: Payer: Self-pay

## 2021-04-27 ENCOUNTER — Emergency Department (HOSPITAL_COMMUNITY)
Admission: EM | Admit: 2021-04-27 | Discharge: 2021-04-27 | Disposition: A | Discharge status: Home / Self Care | Payer: Medicare HMO | Source: Home / Self Care | Attending: Emergency Medicine | Admitting: Emergency Medicine

## 2021-04-27 DIAGNOSIS — Z87891 Personal history of nicotine dependence: Secondary | ICD-10-CM | POA: Insufficient documentation

## 2021-04-27 DIAGNOSIS — I129 Hypertensive chronic kidney disease with stage 1 through stage 4 chronic kidney disease, or unspecified chronic kidney disease: Secondary | ICD-10-CM | POA: Insufficient documentation

## 2021-04-27 DIAGNOSIS — U071 COVID-19: Secondary | ICD-10-CM

## 2021-04-27 DIAGNOSIS — J90 Pleural effusion, not elsewhere classified: Secondary | ICD-10-CM | POA: Diagnosis not present

## 2021-04-27 DIAGNOSIS — J189 Pneumonia, unspecified organism: Secondary | ICD-10-CM | POA: Diagnosis not present

## 2021-04-27 DIAGNOSIS — R000 Tachycardia, unspecified: Secondary | ICD-10-CM | POA: Insufficient documentation

## 2021-04-27 DIAGNOSIS — Z79899 Other long term (current) drug therapy: Secondary | ICD-10-CM | POA: Insufficient documentation

## 2021-04-27 DIAGNOSIS — R0902 Hypoxemia: Secondary | ICD-10-CM | POA: Diagnosis not present

## 2021-04-27 DIAGNOSIS — N183 Chronic kidney disease, stage 3 unspecified: Secondary | ICD-10-CM | POA: Insufficient documentation

## 2021-04-27 DIAGNOSIS — J1282 Pneumonia due to coronavirus disease 2019: Secondary | ICD-10-CM | POA: Insufficient documentation

## 2021-04-27 DIAGNOSIS — J129 Viral pneumonia, unspecified: Secondary | ICD-10-CM | POA: Diagnosis not present

## 2021-04-27 DIAGNOSIS — R0602 Shortness of breath: Secondary | ICD-10-CM | POA: Diagnosis not present

## 2021-04-27 DIAGNOSIS — R059 Cough, unspecified: Secondary | ICD-10-CM | POA: Diagnosis not present

## 2021-04-27 LAB — CBC WITH DIFFERENTIAL/PLATELET
Abs Immature Granulocytes: 0.07 10*3/uL (ref 0.00–0.07)
Basophils Absolute: 0 10*3/uL (ref 0.0–0.1)
Basophils Relative: 0 %
Eosinophils Absolute: 0 10*3/uL (ref 0.0–0.5)
Eosinophils Relative: 1 %
HCT: 42.4 % (ref 39.0–52.0)
Hemoglobin: 14 g/dL (ref 13.0–17.0)
Immature Granulocytes: 1 %
Lymphocytes Relative: 19 %
Lymphs Abs: 1.1 10*3/uL (ref 0.7–4.0)
MCH: 30.8 pg (ref 26.0–34.0)
MCHC: 33 g/dL (ref 30.0–36.0)
MCV: 93.2 fL (ref 80.0–100.0)
Monocytes Absolute: 0.8 10*3/uL (ref 0.1–1.0)
Monocytes Relative: 14 %
Neutro Abs: 3.8 10*3/uL (ref 1.7–7.7)
Neutrophils Relative %: 65 %
Platelets: 131 10*3/uL — ABNORMAL LOW (ref 150–400)
RBC: 4.55 MIL/uL (ref 4.22–5.81)
RDW: 13.9 % (ref 11.5–15.5)
WBC: 5.8 10*3/uL (ref 4.0–10.5)
nRBC: 0 % (ref 0.0–0.2)

## 2021-04-27 LAB — COMPREHENSIVE METABOLIC PANEL
ALT: 20 U/L (ref 0–44)
AST: 31 U/L (ref 15–41)
Albumin: 2.9 g/dL — ABNORMAL LOW (ref 3.5–5.0)
Alkaline Phosphatase: 22 U/L — ABNORMAL LOW (ref 38–126)
Anion gap: 11 (ref 5–15)
BUN: 58 mg/dL — ABNORMAL HIGH (ref 8–23)
CO2: 17 mmol/L — ABNORMAL LOW (ref 22–32)
Calcium: 9 mg/dL (ref 8.9–10.3)
Chloride: 111 mmol/L (ref 98–111)
Creatinine, Ser: 2.31 mg/dL — ABNORMAL HIGH (ref 0.61–1.24)
GFR, Estimated: 27 mL/min — ABNORMAL LOW (ref >60)
Glucose, Bld: 117 mg/dL — ABNORMAL HIGH (ref 70–99)
Potassium: 3.6 mmol/L (ref 3.5–5.1)
Sodium: 139 mmol/L (ref 135–145)
Total Bilirubin: 0.9 mg/dL (ref 0.3–1.2)
Total Protein: 6.3 g/dL — ABNORMAL LOW (ref 6.5–8.1)

## 2021-04-27 LAB — LACTIC ACID, PLASMA: Lactic Acid, Venous: 1.3 mmol/L (ref 0.5–1.9)

## 2021-04-27 MED ORDER — LIDOCAINE HCL (PF) 1 % IJ SOLN
INTRAMUSCULAR | Status: AC
  Administered 2021-04-27: 5 mL
  Filled 2021-04-27: qty 5

## 2021-04-27 MED ORDER — DOXYCYCLINE HYCLATE 100 MG PO CAPS: 100.0000 mg | ORAL_CAPSULE | Freq: Two times a day (BID) | ORAL | 0 refills | Status: DC

## 2021-04-27 MED ORDER — SODIUM CHLORIDE 0.9 % IV SOLN
1.0000 g | Freq: Once | INTRAVENOUS | Status: DC
  Filled 2021-04-27: qty 10

## 2021-04-27 MED ORDER — CEFTRIAXONE SODIUM 1 G IJ SOLR
1.0000 g | Freq: Once | INTRAMUSCULAR | Status: AC
  Administered 2021-04-27: 1 g via INTRAMUSCULAR
  Filled 2021-04-27: qty 10

## 2021-04-27 NOTE — ED Provider Notes (Signed)
[facility] EMERGENCY DEPARTMENT Provider Note   CSN: [id number] Arrival date & time: 04/27/21  8088     History No chief complaint on file.   Shane [name] is a 87 y.o. male.  Patient presents with chief complaint of difficulty breathing.  He states that he is been diagnosed with COVID approximately 10 days ago and has continued to have shortness of breath.  He states symptoms are not improved.  He denies any increase in cough.  Denies chest pain denies vomiting or diarrhea denies any abdominal pain.      Past Medical History:  Diagnosis Date   Allergy    Anemia, iron deficiency    C. difficile diarrhea    Cancer (HCC)    forearm-  squamous   Cataract    bil cataracts removed   DDD (degenerative disc disease), cervical    Dysplastic nevus of left upper extremity    Elevated PSA    Essential hypertension    Extrinsic asthma    GERD (gastroesophageal reflux disease)    no recent   Gout    Hiatal hernia    Macular degeneration    Neuromuscular disorder ([place])    hiatal hernia   Pneumonia    2014ish   Spinal stenosis     Patient Active Problem List   Diagnosis Date Noted   MGUS (monoclonal gammopathy of unknown significance) 11/16/2020   Advanced nonexudative age-related macular degeneration of left eye with subfoveal involvement 06/19/2020   Left epiretinal membrane 06/19/2020   Advanced nonexudative age-related macular degeneration of right eye with subfoveal involvement 06/19/2020   Exudative age-related macular degeneration of right eye with inactive choroidal neovascularization ([place]) 06/19/2020   Chronic kidney disease (CKD), stage III (moderate) ([place]) 06/16/2019   Clostridium difficile infection    Hypertriglyceridemia 12/01/2015   EXTRINSIC ASTHMA, UNSPECIFIED 01/11/2009   SPINAL STENOSIS 05/24/2008   PROSTATE SPECIFIC ANTIGEN, ELEVATED 05/24/2008   OBESITY 08/13/2006   ANEMIA-IRON DEFICIENCY 08/13/2006   Essential hypertension  08/13/2006   ALLERGIC RHINITIS 08/13/2006   GERD 08/13/2006   HIATAL HERNIA 08/13/2006   DEGENERATIVE JOINT DISEASE 08/13/2006    Past Surgical History:  Procedure Laterality Date   BACK SURGERY     Bone Spur Right    Shoulder   COLONOSCOPY     COLONOSCOPY W/ POLYPECTOMY     COLONOSCOPY WITH PROPOFOL N/A 11/19/2016   Procedure: COLONOSCOPY WITH PROPOFOL;  Surgeon: Doran Stabler, MD;  Location: [facility] ENDOSCOPY;  Service: Gastroenterology;  Laterality: N/A;   FECAL TRANSPLANT N/A 11/19/2016   Procedure: FECAL TRANSPLANT;  Surgeon: Doran Stabler, MD;  Location: [place];  Service: Gastroenterology;  Laterality: N/A;   MOUTH SURGERY     2 teeth removed resulting in a dry socket in one of them   POLYPECTOMY     SKIN CANCER EXCISION         Family History  Problem Relation Age of Onset   Multiple myeloma Brother    Prostate cancer Brother    Hypertension Father        ?   Lung cancer Sister    Colon cancer Neg Hx    Stomach cancer Neg Hx    Esophageal cancer Neg Hx    Rectal cancer Neg Hx     Social History   Tobacco Use   Smoking status: Former    Years: 35.00    Types: Cigarettes   Smokeless tobacco: Never   Tobacco comments:  quit late 1980's  Vaping Use   Vaping Use: Never used  Substance Use Topics   Alcohol use: No   Drug use: No    Home Medications Prior to Admission medications   Medication Sig Start Date End Date Taking? Authorizing Provider  doxycycline (VIBRAMYCIN) 100 MG capsule Take 1 capsule (100 mg total) by mouth 2 (two) times daily for 7 days. 04/27/21 05/04/21 Yes Luna Fuse, MD  acetaminophen (TYLENOL) 500 MG tablet Take 500-1,000 mg by mouth every 6 (six) hours as needed (for pain).    [provider]  albuterol (VENTOLIN HFA) 108 (90 Base) MCG/ACT inhaler Inhale 2 puffs into the lungs every 6 (six) hours as needed for wheezing or shortness of breath. 04/25/21 04/25/22  Panosh, Standley Brooking, MD  allopurinol (ZYLOPRIM) 300 MG  tablet Take 1 tablet (300 mg total) by mouth daily. 10/11/20   Nafziger, Tommi Rumps, NP  atenolol (TENORMIN) 25 MG tablet Take 1 tablet (25 mg total) by mouth daily. 10/11/20   Nafziger, Tommi Rumps, NP  benzonatate (TESSALON PERLES) 100 MG capsule Take 1 capsule (100 mg total) by mouth 3 (three) times daily as needed for cough. 04/25/21   Panosh, Standley Brooking, MD  budesonide (ENTOCORT EC) 3 MG 24 hr capsule Take 3 capsules (9 mg total) by mouth daily. 11/27/20   Doran Stabler, MD  chlorthalidone (HYGROTON) 25 MG tablet Take 0.5 tablets (12.5 mg total) by mouth daily. 10/11/20   Nafziger, Tommi Rumps, NP  finasteride (PROSCAR) 5 MG tablet Take 1 tablet (5 mg total) by mouth daily. 10/11/20   Nafziger, Tommi Rumps, NP  lisinopril (ZESTRIL) 5 MG tablet TAKE ONE TABLET BY MOUTH EVERY MORNING and TAKE ONE TABLET BY MOUTH EVERYDAY AT BEDTIME 03/30/21   Nafziger, Tommi Rumps, NP  Multiple Vitamins-Minerals (PRESERVISION AREDS 2) CAPS Take 1 capsule by mouth 2 (two) times daily.    [provider]  simvastatin (ZOCOR) 10 MG tablet Take 1 tablet (10 mg total) by mouth at bedtime. 10/10/20   Dorothyann Peng, NP    Allergies    Patient has no known allergies.  Review of Systems   Review of Systems  Constitutional:  Negative for fever.  HENT:  Negative for ear pain and sore throat.   Eyes:  Negative for pain.  Respiratory:  Positive for cough and shortness of breath.   Cardiovascular:  Negative for chest pain.  Gastrointestinal:  Negative for abdominal pain.  Genitourinary:  Negative for flank pain.  Musculoskeletal:  Negative for back pain.  Skin:  Negative for color change and rash.  Neurological:  Negative for syncope.  All other systems reviewed and are negative.  Physical Exam Updated Vital Signs BP (!) 142/71   Pulse 90   Temp 99.3 F (37.4 C) (Oral)   Resp (!) 25   SpO2 94%   Physical Exam Constitutional:      Appearance: He is well-developed.  HENT:     Head: Normocephalic.     Nose: Nose normal.  Eyes:      Extraocular Movements: Extraocular movements intact.  Cardiovascular:     Rate and Rhythm: Normal rate.  Pulmonary:     Effort: Pulmonary effort is normal.  Skin:    Coloration: Skin is not jaundiced.  Neurological:     Mental Status: He is alert. Mental status is at baseline.    ED Results / Procedures / Treatments   Labs (all labs ordered are listed, but only abnormal results are displayed) Labs Reviewed  COMPREHENSIVE METABOLIC PANEL -  Abnormal; Notable for the following components:      Result Value   CO2 17 (*)    Glucose, Bld 117 (*)    BUN 58 (*)    Creatinine, Ser 2.31 (*)    Total Protein 6.3 (*)    Albumin 2.9 (*)    Alkaline Phosphatase 22 (*)    GFR, Estimated 27 (*)    All other components within normal limits  CBC WITH DIFFERENTIAL/PLATELET - Abnormal; Notable for the following components:   Platelets 131 (*)    All other components within normal limits  LACTIC ACID, PLASMA  LACTIC ACID, PLASMA  URINALYSIS, ROUTINE W REFLEX MICROSCOPIC    EKG EKG Interpretation  Date/Time:  Friday April 27 2021 09:59:44 EDT Ventricular Rate:  107 PR Interval:  210 QRS Duration: 88 QT Interval:  342 QTC Calculation: 456 R Axis:   -47 Text Interpretation: Sinus tachycardia with 1st degree A-V block with occasional Premature ventricular complexes Left anterior fascicular block Minimal voltage criteria for LVH, may be normal variant ( R in aVL ) Inferior infarct , age undetermined Anterolateral infarct , age undetermined Abnormal ECG Confirmed by Thamas Jaegers ([id number]) on 04/27/2021 11:48:23 AM  Radiology DG Chest 2 View  Result Date: 04/27/2021 CLINICAL DATA:  Cough EXAM: CHEST - 2 VIEW COMPARISON:  Radiograph 10/07/2017 FINDINGS: Unchanged, enlarged cardiac silhouette. There are left basilar opacities and a small left pleural effusion. No visible pneumothorax. No acute osseous abnormality. Thoracic spondylosis. IMPRESSION: Left basilar pneumonia and small left pleural effusion.  Electronically Signed   By: Maurine Simmering   On: 04/27/2021 10:52    Procedures Procedures   Medications Ordered in ED Medications  cefTRIAXone (ROCEPHIN) 1 g in sodium chloride 0.9 % 100 mL IVPB (has no administration in time range)    ED Course  I have reviewed the triage vital signs and the nursing notes.  Pertinent labs & imaging results that were available during my care of the patient were reviewed by me and considered in my medical decision making (see chart for details).    MDM Rules/Calculators/A&P                           Chest x-ray shows a lower lobe infiltrate.  Unclear whether this is just due to COVID or new development.  Given patient is symptomatic with shortness of breath will treat with IV antibiotics.  However vital signs are within normal range.  Patient given a gram of Rocephin here.  Will be given a prescription of doxycycline to go home with.  Advise follow-up with his doctor related to 3 days.  Advising immediate return if his breathing worsens or if he has any additional concerns.  Final Clinical Impression(s) / ED Diagnoses Final diagnoses:  Pneumonia due to COVID-19 virus    Rx / DC Orders ED Discharge Orders          Ordered    doxycycline (VIBRAMYCIN) 100 MG capsule  2 times daily        04/27/21 1336             Luna Fuse, MD 04/27/21 1337

## 2021-04-27 NOTE — Discharge Instructions (Addendum)
Call your primary care doctor or specialist as discussed in the next 2-3 days.   Return immediately back to the ER if:  Your symptoms worsen within the next 12-24 hours. You develop new symptoms such as new fevers, persistent vomiting, new pain, shortness of breath, or new weakness or numbness, or if you have any other concerns.  

## 2021-04-27 NOTE — ED Triage Notes (Signed)
Patient complains of being diagnosed with Covid on 7/19 and has developed increased SOB. Has been vaccinated. Nonsmoker. Denies productive cough. Nausea with same

## 2021-04-30 ENCOUNTER — Emergency Department (HOSPITAL_COMMUNITY): Payer: Medicare HMO

## 2021-04-30 ENCOUNTER — Inpatient Hospital Stay (HOSPITAL_COMMUNITY)
Admission: EM | Admit: 2021-04-30 | Discharge: 2021-05-03 | DRG: 193 | Disposition: A | Discharge status: Home Health | Payer: Medicare HMO | Attending: Internal Medicine | Admitting: Internal Medicine

## 2021-04-30 ENCOUNTER — Encounter (HOSPITAL_COMMUNITY): Payer: Self-pay | Admitting: *Deleted

## 2021-04-30 ENCOUNTER — Other Ambulatory Visit: Payer: Self-pay

## 2021-04-30 ENCOUNTER — Telehealth: Payer: Self-pay | Admitting: Adult Health

## 2021-04-30 DIAGNOSIS — U071 COVID-19: Secondary | ICD-10-CM | POA: Diagnosis present

## 2021-04-30 DIAGNOSIS — Z8249 Family history of ischemic heart disease and other diseases of the circulatory system: Secondary | ICD-10-CM

## 2021-04-30 DIAGNOSIS — R778 Other specified abnormalities of plasma proteins: Secondary | ICD-10-CM | POA: Diagnosis not present

## 2021-04-30 DIAGNOSIS — J189 Pneumonia, unspecified organism: Secondary | ICD-10-CM | POA: Diagnosis not present

## 2021-04-30 DIAGNOSIS — N400 Enlarged prostate without lower urinary tract symptoms: Secondary | ICD-10-CM | POA: Diagnosis present

## 2021-04-30 DIAGNOSIS — Z807 Family history of other malignant neoplasms of lymphoid, hematopoietic and related tissues: Secondary | ICD-10-CM | POA: Diagnosis not present

## 2021-04-30 DIAGNOSIS — J129 Viral pneumonia, unspecified: Secondary | ICD-10-CM | POA: Diagnosis not present

## 2021-04-30 DIAGNOSIS — J90 Pleural effusion, not elsewhere classified: Secondary | ICD-10-CM | POA: Diagnosis not present

## 2021-04-30 DIAGNOSIS — R0602 Shortness of breath: Secondary | ICD-10-CM | POA: Diagnosis not present

## 2021-04-30 DIAGNOSIS — N179 Acute kidney failure, unspecified: Secondary | ICD-10-CM | POA: Diagnosis not present

## 2021-04-30 DIAGNOSIS — J9811 Atelectasis: Secondary | ICD-10-CM | POA: Diagnosis not present

## 2021-04-30 DIAGNOSIS — J1282 Pneumonia due to coronavirus disease 2019: Secondary | ICD-10-CM | POA: Diagnosis not present

## 2021-04-30 DIAGNOSIS — I5033 Acute on chronic diastolic (congestive) heart failure: Secondary | ICD-10-CM | POA: Diagnosis present

## 2021-04-30 DIAGNOSIS — I130 Hypertensive heart and chronic kidney disease with heart failure and stage 1 through stage 4 chronic kidney disease, or unspecified chronic kidney disease: Secondary | ICD-10-CM | POA: Diagnosis present

## 2021-04-30 DIAGNOSIS — Z85828 Personal history of other malignant neoplasm of skin: Secondary | ICD-10-CM | POA: Diagnosis not present

## 2021-04-30 DIAGNOSIS — Z87891 Personal history of nicotine dependence: Secondary | ICD-10-CM | POA: Diagnosis not present

## 2021-04-30 DIAGNOSIS — K219 Gastro-esophageal reflux disease without esophagitis: Secondary | ICD-10-CM | POA: Diagnosis present

## 2021-04-30 DIAGNOSIS — M109 Gout, unspecified: Secondary | ICD-10-CM | POA: Diagnosis present

## 2021-04-30 DIAGNOSIS — E785 Hyperlipidemia, unspecified: Secondary | ICD-10-CM | POA: Diagnosis not present

## 2021-04-30 DIAGNOSIS — R0902 Hypoxemia: Secondary | ICD-10-CM | POA: Diagnosis not present

## 2021-04-30 DIAGNOSIS — I509 Heart failure, unspecified: Secondary | ICD-10-CM | POA: Diagnosis not present

## 2021-04-30 DIAGNOSIS — I440 Atrioventricular block, first degree: Secondary | ICD-10-CM | POA: Diagnosis present

## 2021-04-30 DIAGNOSIS — H9193 Unspecified hearing loss, bilateral: Secondary | ICD-10-CM | POA: Diagnosis present

## 2021-04-30 DIAGNOSIS — K449 Diaphragmatic hernia without obstruction or gangrene: Secondary | ICD-10-CM | POA: Diagnosis not present

## 2021-04-30 DIAGNOSIS — J9601 Acute respiratory failure with hypoxia: Secondary | ICD-10-CM | POA: Diagnosis present

## 2021-04-30 DIAGNOSIS — D472 Monoclonal gammopathy: Secondary | ICD-10-CM | POA: Diagnosis not present

## 2021-04-30 DIAGNOSIS — Z801 Family history of malignant neoplasm of trachea, bronchus and lung: Secondary | ICD-10-CM

## 2021-04-30 DIAGNOSIS — N184 Chronic kidney disease, stage 4 (severe): Secondary | ICD-10-CM | POA: Diagnosis present

## 2021-04-30 DIAGNOSIS — I517 Cardiomegaly: Secondary | ICD-10-CM | POA: Diagnosis not present

## 2021-04-30 LAB — COMPREHENSIVE METABOLIC PANEL
ALT: 21 U/L (ref 0–44)
AST: 45 U/L — ABNORMAL HIGH (ref 15–41)
Albumin: 2.6 g/dL — ABNORMAL LOW (ref 3.5–5.0)
Alkaline Phosphatase: 23 U/L — ABNORMAL LOW (ref 38–126)
Anion gap: 12 (ref 5–15)
BUN: 63 mg/dL — ABNORMAL HIGH (ref 8–23)
CO2: 17 mmol/L — ABNORMAL LOW (ref 22–32)
Calcium: 8.9 mg/dL (ref 8.9–10.3)
Chloride: 110 mmol/L (ref 98–111)
Creatinine, Ser: 2.67 mg/dL — ABNORMAL HIGH (ref 0.61–1.24)
GFR, Estimated: 23 mL/min — ABNORMAL LOW (ref >60)
Glucose, Bld: 111 mg/dL — ABNORMAL HIGH (ref 70–99)
Potassium: 3.7 mmol/L (ref 3.5–5.1)
Sodium: 139 mmol/L (ref 135–145)
Total Bilirubin: 1.2 mg/dL (ref 0.3–1.2)
Total Protein: 6.3 g/dL — ABNORMAL LOW (ref 6.5–8.1)

## 2021-04-30 LAB — CBC WITH DIFFERENTIAL/PLATELET
Abs Immature Granulocytes: 0.04 10*3/uL (ref 0.00–0.07)
Basophils Absolute: 0 10*3/uL (ref 0.0–0.1)
Basophils Relative: 0 %
Eosinophils Absolute: 0 10*3/uL (ref 0.0–0.5)
Eosinophils Relative: 0 %
HCT: 44.8 % (ref 39.0–52.0)
Hemoglobin: 14.2 g/dL (ref 13.0–17.0)
Immature Granulocytes: 1 %
Lymphocytes Relative: 17 %
Lymphs Abs: 0.9 10*3/uL (ref 0.7–4.0)
MCH: 30.1 pg (ref 26.0–34.0)
MCHC: 31.7 g/dL (ref 30.0–36.0)
MCV: 95.1 fL (ref 80.0–100.0)
Monocytes Absolute: 0.8 10*3/uL (ref 0.1–1.0)
Monocytes Relative: 15 %
Neutro Abs: 3.4 10*3/uL (ref 1.7–7.7)
Neutrophils Relative %: 67 %
Platelets: 144 10*3/uL — ABNORMAL LOW (ref 150–400)
RBC: 4.71 MIL/uL (ref 4.22–5.81)
RDW: 13.8 % (ref 11.5–15.5)
WBC: 5.1 10*3/uL (ref 4.0–10.5)
nRBC: 0 % (ref 0.0–0.2)

## 2021-04-30 LAB — TROPONIN I (HIGH SENSITIVITY)
Troponin I (High Sensitivity): 149 ng/L (ref <18)
Troponin I (High Sensitivity): 133 ng/L (ref <18)

## 2021-04-30 MED ORDER — FINASTERIDE 5 MG PO TABS: 5.0000 mg | ORAL_TABLET | Freq: Every day | ORAL | Status: DC

## 2021-04-30 MED ORDER — SODIUM CHLORIDE 0.9 % IV BOLUS
500.0000 mL | Freq: Once | INTRAVENOUS | Status: AC
  Administered 2021-04-30: 500 mL via INTRAVENOUS

## 2021-04-30 MED ORDER — ATENOLOL 25 MG PO TABS: 25.0000 mg | ORAL_TABLET | Freq: Every day | ORAL | Status: DC

## 2021-04-30 MED ORDER — ALBUTEROL SULFATE (2.5 MG/3ML) 0.083% IN NEBU: 2.5000 mg | INHALATION_SOLUTION | Freq: Four times a day (QID) | RESPIRATORY_TRACT | Status: DC | PRN

## 2021-04-30 MED ORDER — OCUVITE-LUTEIN PO CAPS
1.0000 | ORAL_CAPSULE | Freq: Two times a day (BID) | ORAL | Status: DC
  Filled 2021-04-30: qty 1

## 2021-04-30 MED ORDER — SODIUM CHLORIDE 0.9 % IV SOLN: INTRAVENOUS | Status: DC

## 2021-04-30 MED ORDER — ONDANSETRON HCL 4 MG PO TABS: 4.0000 mg | ORAL_TABLET | Freq: Four times a day (QID) | ORAL | Status: DC | PRN

## 2021-04-30 MED ORDER — SODIUM BICARBONATE 650 MG PO TABS
650.0000 mg | ORAL_TABLET | Freq: Two times a day (BID) | ORAL | Status: DC
  Administered 2021-04-30 – 2021-05-02 (×4): 650 mg via ORAL
  Filled 2021-04-30 (×5): qty 1

## 2021-04-30 MED ORDER — GUAIFENESIN ER 600 MG PO TB12
1200.0000 mg | ORAL_TABLET | Freq: Two times a day (BID) | ORAL | Status: DC
  Administered 2021-04-30 – 2021-05-03 (×6): 1200 mg via ORAL
  Filled 2021-04-30 (×6): qty 2

## 2021-04-30 MED ORDER — ACETAMINOPHEN 500 MG PO TABS
500.0000 mg | ORAL_TABLET | Freq: Four times a day (QID) | ORAL | Status: DC | PRN
  Administered 2021-05-01 – 2021-05-02 (×3): 1000 mg via ORAL
  Filled 2021-04-30 (×3): qty 2

## 2021-04-30 MED ORDER — SODIUM CHLORIDE 0.9 % IV SOLN
1.0000 g | INTRAVENOUS | Status: DC
  Administered 2021-04-30 – 2021-05-02 (×3): 1 g via INTRAVENOUS
  Filled 2021-04-30 (×3): qty 10

## 2021-04-30 MED ORDER — SIMVASTATIN 20 MG PO TABS
10.0000 mg | ORAL_TABLET | Freq: Every day | ORAL | Status: DC
  Administered 2021-04-30 – 2021-05-02 (×3): 10 mg via ORAL
  Filled 2021-04-30 (×3): qty 1

## 2021-04-30 MED ORDER — DOXYCYCLINE HYCLATE 100 MG PO TABS
100.0000 mg | ORAL_TABLET | Freq: Two times a day (BID) | ORAL | Status: DC
  Administered 2021-04-30 – 2021-05-03 (×6): 100 mg via ORAL
  Filled 2021-04-30 (×6): qty 1

## 2021-04-30 MED ORDER — FUROSEMIDE 20 MG PO TABS: 20.0000 mg | ORAL_TABLET | Freq: Once | ORAL | Status: DC

## 2021-04-30 MED ORDER — HYDRALAZINE HCL 25 MG PO TABS: 25.0000 mg | ORAL_TABLET | Freq: Four times a day (QID) | ORAL | Status: DC | PRN

## 2021-04-30 MED ORDER — HEPARIN SODIUM (PORCINE) 5000 UNIT/ML IJ SOLN
5000.0000 [IU] | Freq: Two times a day (BID) | INTRAMUSCULAR | Status: DC
  Administered 2021-04-30 – 2021-05-03 (×6): 5000 [IU] via SUBCUTANEOUS
  Filled 2021-04-30 (×6): qty 1

## 2021-04-30 MED ORDER — ATENOLOL 50 MG PO TABS
25.0000 mg | ORAL_TABLET | Freq: Every day | ORAL | Status: DC
  Administered 2021-04-30 – 2021-05-03 (×3): 25 mg via ORAL
  Filled 2021-04-30 (×4): qty 1

## 2021-04-30 MED ORDER — ISOSORBIDE MONONITRATE ER 30 MG PO TB24
15.0000 mg | ORAL_TABLET | Freq: Every day | ORAL | Status: DC
  Administered 2021-04-30 – 2021-05-03 (×4): 15 mg via ORAL
  Filled 2021-04-30 (×4): qty 1

## 2021-04-30 MED ORDER — ALLOPURINOL 300 MG PO TABS
300.0000 mg | ORAL_TABLET | Freq: Every day | ORAL | Status: DC
  Administered 2021-05-01 – 2021-05-02 (×2): 300 mg via ORAL
  Filled 2021-04-30 (×2): qty 1

## 2021-04-30 MED ORDER — SODIUM CHLORIDE 0.9 % IV BOLUS
500.0000 mL | Freq: Once | INTRAVENOUS | Status: AC
Start: 2021-04-30 — End: 2021-04-30
  Administered 2021-04-30: 500 mL via INTRAVENOUS

## 2021-04-30 MED ORDER — FINASTERIDE 5 MG PO TABS
5.0000 mg | ORAL_TABLET | Freq: Every day | ORAL | Status: DC
  Administered 2021-04-30 – 2021-05-03 (×4): 5 mg via ORAL
  Filled 2021-04-30 (×4): qty 1

## 2021-04-30 MED ORDER — ISOSORBIDE MONONITRATE ER 30 MG PO TB24: 15.0000 mg | ORAL_TABLET | Freq: Every day | ORAL | Status: DC

## 2021-04-30 MED ORDER — IPRATROPIUM-ALBUTEROL 0.5-2.5 (3) MG/3ML IN SOLN
3.0000 mL | Freq: Four times a day (QID) | RESPIRATORY_TRACT | Status: DC
  Administered 2021-04-30: 3 mL via RESPIRATORY_TRACT
  Filled 2021-04-30 (×2): qty 3

## 2021-04-30 MED ORDER — IOHEXOL 350 MG/ML SOLN
75.0000 mL | Freq: Once | INTRAVENOUS | Status: AC | PRN
  Administered 2021-04-30: 75 mL via INTRAVENOUS

## 2021-04-30 MED ORDER — ONDANSETRON HCL 4 MG/2ML IJ SOLN: 4.0000 mg | Freq: Four times a day (QID) | INTRAMUSCULAR | Status: DC | PRN

## 2021-04-30 MED ORDER — BENZONATATE 100 MG PO CAPS
100.0000 mg | ORAL_CAPSULE | Freq: Three times a day (TID) | ORAL | Status: DC | PRN
  Administered 2021-05-01 – 2021-05-02 (×2): 100 mg via ORAL
  Filled 2021-04-30 (×2): qty 1

## 2021-04-30 NOTE — ED Notes (Signed)
Assisted pt with use of restroom

## 2021-04-30 NOTE — ED Notes (Signed)
Messaged pharmacy for 2200 sodium bicarb dose

## 2021-04-30 NOTE — ED Triage Notes (Signed)
Patient presents to ed c/o increased sob today, states he was in the ED on fri.and dx. With covid was discharged home  states he started having sob , called his PCP today as instructed and PCP told him to come to the ED. States he was stung by 2 bees enroute here today, c/o pain in right elbow.

## 2021-04-30 NOTE — Telephone Encounter (Signed)
Access Nurse conference called for [place] after getting refusal from PT wife. PT wife was refusing to go to the ED with PT again as they had went Friday and was told to be seen by our office by the ED instead of coming back to the ED. PT wife did accept a virtual visit for tomorrow for the symptoms of shortness of breath and covid symptoms that they were seen for Friday at ED.

## 2021-04-30 NOTE — ED Provider Notes (Signed)
[facility] EMERGENCY DEPARTMENT Provider Note   CSN: [id number] Arrival date & time: 04/30/21  0945     History Chief Complaint  Patient presents with   Shortness of Breath    Shane [name] is a 87 y.o. male with a past medical history of GERD, anemia, hypertension presenting to the ED with continued shortness of breath.  Was diagnosed with COVID on 04/14/2021.  Since then he has been having nausea, generalized weakness, cough productive with white mucus and shortness of breath.  He is experiencing some mid back pain while coughing.  He was seen and evaluated a few days ago, diagnosed with pneumonia and placed on doxycycline.  He has been taking this as well as antitussives and albuterol inhaler but continues to be symptomatic.  He tried to schedule a follow-up appointment with PCP today but was told to come to the ER due to his symptoms.  Denies any chest pain, leg swelling, vomiting, abdominal pain, headache, vision changes.  He does endorse a decreased appetite.  Denies any fevers.  Feels that his cough has somewhat improved.  HPI     Past Medical History:  Diagnosis Date   Allergy    Anemia, iron deficiency    C. difficile diarrhea    Cancer (HCC)    forearm-  squamous   Cataract    bil cataracts removed   DDD (degenerative disc disease), cervical    Dysplastic nevus of left upper extremity    Elevated PSA    Essential hypertension    Extrinsic asthma    GERD (gastroesophageal reflux disease)    no recent   Gout    Hiatal hernia    Macular degeneration    Neuromuscular disorder ([place])    hiatal hernia   Pneumonia    2014ish   Spinal stenosis     Patient Active Problem List   Diagnosis Date Noted   MGUS (monoclonal gammopathy of unknown significance) 11/16/2020   Advanced nonexudative age-related macular degeneration of left eye with subfoveal involvement 06/19/2020   Left epiretinal membrane 06/19/2020   Advanced nonexudative age-related macular  degeneration of right eye with subfoveal involvement 06/19/2020   Exudative age-related macular degeneration of right eye with inactive choroidal neovascularization ([place]) 06/19/2020   Chronic kidney disease (CKD), stage III (moderate) ([place]) 06/16/2019   Clostridium difficile infection    Hypertriglyceridemia 12/01/2015   EXTRINSIC ASTHMA, UNSPECIFIED 01/11/2009   SPINAL STENOSIS 05/24/2008   PROSTATE SPECIFIC ANTIGEN, ELEVATED 05/24/2008   OBESITY 08/13/2006   ANEMIA-IRON DEFICIENCY 08/13/2006   Essential hypertension 08/13/2006   ALLERGIC RHINITIS 08/13/2006   GERD 08/13/2006   HIATAL HERNIA 08/13/2006   DEGENERATIVE JOINT DISEASE 08/13/2006    Past Surgical History:  Procedure Laterality Date   BACK SURGERY     Bone Spur Right    Shoulder   COLONOSCOPY     COLONOSCOPY W/ POLYPECTOMY     COLONOSCOPY WITH PROPOFOL N/A 11/19/2016   Procedure: COLONOSCOPY WITH PROPOFOL;  Surgeon: Doran Stabler, MD;  Location: [facility] ENDOSCOPY;  Service: Gastroenterology;  Laterality: N/A;   FECAL TRANSPLANT N/A 11/19/2016   Procedure: FECAL TRANSPLANT;  Surgeon: Doran Stabler, MD;  Location: [place];  Service: Gastroenterology;  Laterality: N/A;   MOUTH SURGERY     2 teeth removed resulting in a dry socket in one of them   POLYPECTOMY     SKIN CANCER EXCISION         Family History  Problem Relation Age of  Onset   Multiple myeloma Brother    Prostate cancer Brother    Hypertension Father        ?   Lung cancer Sister    Colon cancer Neg Hx    Stomach cancer Neg Hx    Esophageal cancer Neg Hx    Rectal cancer Neg Hx     Social History   Tobacco Use   Smoking status: Former    Years: 35.00    Types: Cigarettes   Smokeless tobacco: Never   Tobacco comments:    quit late 1980's  Vaping Use   Vaping Use: Never used  Substance Use Topics   Alcohol use: No   Drug use: No    Home Medications Prior to Admission medications   Medication Sig Start Date End Date Taking?  Authorizing Provider  acetaminophen (TYLENOL) 500 MG tablet Take 500-1,000 mg by mouth every 6 (six) hours as needed (for pain).   Yes [provider]  albuterol (VENTOLIN HFA) 108 (90 Base) MCG/ACT inhaler Inhale 2 puffs into the lungs every 6 (six) hours as needed for wheezing or shortness of breath. 04/25/21 04/25/22 Yes Panosh, Standley Brooking, MD  allopurinol (ZYLOPRIM) 300 MG tablet Take 1 tablet (300 mg total) by mouth daily. 10/11/20  Yes Nafziger, Tommi Rumps, NP  atenolol (TENORMIN) 25 MG tablet Take 1 tablet (25 mg total) by mouth daily. 10/11/20  Yes Nafziger, Tommi Rumps, NP  benzonatate (TESSALON PERLES) 100 MG capsule Take 1 capsule (100 mg total) by mouth 3 (three) times daily as needed for cough. 04/25/21  Yes Panosh, Standley Brooking, MD  budesonide (ENTOCORT EC) 3 MG 24 hr capsule Take 3 capsules (9 mg total) by mouth daily. 11/27/20  Yes Danis, Kirke Corin, MD  chlorthalidone (HYGROTON) 25 MG tablet Take 0.5 tablets (12.5 mg total) by mouth daily. 10/11/20  Yes Nafziger, Tommi Rumps, NP  doxycycline (VIBRAMYCIN) 100 MG capsule Take 1 capsule (100 mg total) by mouth 2 (two) times daily for 7 days. 04/27/21 05/04/21 Yes Luna Fuse, MD  finasteride (PROSCAR) 5 MG tablet Take 1 tablet (5 mg total) by mouth daily. 10/11/20  Yes Nafziger, Tommi Rumps, NP  lisinopril (ZESTRIL) 5 MG tablet TAKE ONE TABLET BY MOUTH EVERY MORNING and TAKE ONE TABLET BY MOUTH EVERYDAY AT BEDTIME 03/30/21  Yes Nafziger, Tommi Rumps, NP  Multiple Vitamins-Minerals (PRESERVISION AREDS 2) CAPS Take 1 capsule by mouth 2 (two) times daily.   Yes [provider]  simvastatin (ZOCOR) 10 MG tablet Take 1 tablet (10 mg total) by mouth at bedtime. 10/10/20  Yes Nafziger, Tommi Rumps, NP    Allergies    Patient has no known allergies.  Review of Systems   Review of Systems  Constitutional:  Positive for appetite change. Negative for chills and fever.  HENT:  Negative for ear pain, rhinorrhea, sneezing and sore throat.   Eyes:  Negative for photophobia and visual  disturbance.  Respiratory:  Positive for cough and shortness of breath. Negative for chest tightness and wheezing.   Cardiovascular:  Negative for chest pain and palpitations.  Gastrointestinal:  Positive for nausea. Negative for abdominal pain, blood in stool, constipation, diarrhea and vomiting.  Genitourinary:  Negative for dysuria, hematuria and urgency.  Musculoskeletal:  Positive for myalgias.  Skin:  Negative for rash.  Neurological:  Negative for dizziness, weakness and light-headedness.   Physical Exam Updated Vital Signs BP (!) 144/87   Pulse 91   Temp 98.3 F (36.8 C) (Oral)   Resp (!) 26   Ht  $'5\' 9"'k$  (1.753 m)   Wt 80.3 kg   SpO2 94%   BMI 26.14 kg/m   Physical Exam Vitals and nursing note reviewed.  Constitutional:      General: He is not in acute distress.    Appearance: He is well-developed.     Comments: Speaking complete sentences without difficulty.  HENT:     Head: Normocephalic and atraumatic.     Nose: Nose normal.  Eyes:     General: No scleral icterus.       Left eye: No discharge.     Conjunctiva/sclera: Conjunctivae normal.  Cardiovascular:     Rate and Rhythm: Regular rhythm. Tachycardia present.     Heart sounds: Normal heart sounds. No murmur heard.   No friction rub. No gallop.  Pulmonary:     Effort: Pulmonary effort is normal. Tachypnea present. No respiratory distress.     Breath sounds: Normal breath sounds.  Abdominal:     General: Bowel sounds are normal. There is no distension.     Palpations: Abdomen is soft.     Tenderness: There is no abdominal tenderness. There is no guarding.  Musculoskeletal:        General: Normal range of motion.     Cervical back: Normal range of motion and neck supple.  Skin:    General: Skin is warm and dry.     Findings: No rash.  Neurological:     Mental Status: He is alert.     Motor: No abnormal muscle tone.     Coordination: Coordination normal.    ED Results / Procedures / Treatments    Labs (all labs ordered are listed, but only abnormal results are displayed) Labs Reviewed  COMPREHENSIVE METABOLIC PANEL - Abnormal; Notable for the following components:      Result Value   CO2 17 (*)    Glucose, Bld 111 (*)    BUN 63 (*)    Creatinine, Ser 2.67 (*)    Total Protein 6.3 (*)    Albumin 2.6 (*)    AST 45 (*)    Alkaline Phosphatase 23 (*)    GFR, Estimated 23 (*)    All other components within normal limits  CBC WITH DIFFERENTIAL/PLATELET - Abnormal; Notable for the following components:   Platelets 144 (*)    All other components within normal limits  TROPONIN I (HIGH SENSITIVITY) - Abnormal; Notable for the following components:   Troponin I (High Sensitivity) 149 (*)    All other components within normal limits  TROPONIN I (HIGH SENSITIVITY) - Abnormal; Notable for the following components:   Troponin I (High Sensitivity) 133 (*)    All other components within normal limits  CBG MONITORING, ED    EKG EKG Interpretation  Date/Time:  Monday April 30 2021 09:58:45 EDT Ventricular Rate:  101 PR Interval:  206 QRS Duration: 96 QT Interval:  350 QTC Calculation: 454 R Axis:   -60 Text Interpretation: Sinus or ectopic atrial tachycardia Abnormal R-wave progression, late transition Inferior infarct, old Confirmed by Regan Lemming ([id number]) on 04/30/2021 10:09:10 AM  Radiology DG Chest 2 View  Result Date: 04/30/2021 CLINICAL DATA:  Shortness of breath, COVID EXAM: CHEST - 2 VIEW COMPARISON:  04/27/2021 FINDINGS: Left basilar airspace opacity with small left effusion again noted, not significantly changed. No confluent opacity on the right. Heart is borderline in size. Aortic atherosclerosis. No acute bony abnormality. IMPRESSION: Continued left basilar opacity concerning for pneumonia. Small left effusion. No real change. Electronically Signed  By: Rolm Baptise M.D.   On: 04/30/2021 11:20   CT Angio Chest PE W/Cm &/Or Wo Cm  Result Date: 04/30/2021 CLINICAL DATA:   Shortness of breath, COVID-19 positive. PE suspected EXAM: CT ANGIOGRAPHY CHEST WITH CONTRAST TECHNIQUE: Multidetector CT imaging of the chest was performed using the standard protocol during bolus administration of intravenous contrast. Multiplanar CT image reconstructions and MIPs were obtained to evaluate the vascular anatomy. CONTRAST:  22m OMNIPAQUE IOHEXOL 350 MG/ML SOLN COMPARISON:  Same day chest x-ray FINDINGS: Cardiovascular: Satisfactory opacification of the pulmonary arteries to the segmental level. No evidence of pulmonary embolism. Thoracic aorta is nonaneurysmal. Atherosclerotic calcifications of the aorta and coronary arteries. Heart size is borderline enlarged. No pericardial effusion. Mediastinum/Nodes: No enlarged mediastinal, hilar, or axillary lymph nodes. Thyroid gland, trachea, and esophagus demonstrate no significant findings. Lungs/Pleura: Patchy airspace consolidation within the basilar segment of the left lower lobe. Subsegmental atelectasis within the dependent right lower lobe. Minimal ground-glass opacity within the bilateral upper lobes. No pleural effusion or pneumothorax. Upper Abdomen: Small hiatal hernia. Musculoskeletal: No chest wall abnormality. No acute or significant osseous findings. Multilevel bridging endplate osteophytes anteriorly within the thoracic spine suggesting diffuse idiopathic skeletal hyperostosis. Review of the MIP images confirms the above findings. IMPRESSION: 1. No evidence of pulmonary embolism. 2. Patchy airspace consolidation within the basilar segment of the left lower lobe, suspicious for pneumonia. 3. Minimal ground-glass opacity within the bilateral upper lobes, which may represent additional sites of atypical/viral infection. 4. Aortic atherosclerosis (ICD10-I70.0). 5. Skeletal findings suggestive of DISH. Electronically Signed   By: NDavina PokeD.O.   On: 04/30/2021 15:53    Procedures .Critical Care  Date/Time: 04/30/2021 4:53  PM Performed by: KDelia Heady PA-C Authorized by: KDelia Heady PA-C   Critical care provider statement:    Critical care time (minutes):  45   Critical care time was exclusive of:  Separately billable procedures and treating other patients and teaching time   Critical care was necessary to treat or prevent imminent or life-threatening deterioration of the following conditions:  Cardiac failure, circulatory failure, respiratory failure, renal failure and sepsis   Critical care was time spent personally by me on the following activities:  Development of treatment plan with patient or surrogate, discussions with consultants, evaluation of patient's response to treatment, examination of patient, obtaining history from patient or surrogate, ordering and review of laboratory studies, ordering and performing treatments and interventions, review of old charts, ordering and review of radiographic studies, pulse oximetry and re-evaluation of patient's condition   I assumed direction of critical care for this patient from another provider in my specialty: no     Care discussed with: admitting provider     Medications Ordered in ED Medications  sodium chloride 0.9 % bolus 500 mL (500 mLs Intravenous New Bag/Given 04/30/21 1401)  iohexol (OMNIPAQUE) 350 MG/ML injection 75 mL (75 mLs Intravenous Contrast Given 04/30/21 1521)    ED Course  I have reviewed the triage vital signs and the nursing notes.  Pertinent labs & imaging results that were available during my care of the patient were reviewed by me and considered in my medical decision making (see chart for details).  Clinical Course as of 04/30/21 1652  Mon Apr 30, 2021  1009 DG Chest 2 View Unchanged from prior. Infiltrate present. [HK]  1231 Creatinine(!): 2.67 Increased from baseline. [HK]  1231 GFR, Estimated(!): 23 [HK]  1231 Troponin I (High Sensitivity)(!!): 149 [HK]  1449 Patient with GFR of 23  however explained to him risk versus benefits  of obtaining CT angio to rule out a PE and he agrees to proceed. [HK]  1450 Troponin I (High Sensitivity)(!!): 133 [HK]  1547 Consulted cardiology regarding patient's elevated troponins.  Will admit to medicine service. [HK]    Clinical Course User Index [HK] Delia Heady, PA-C   MDM Rules/Calculators/A&P                           Shane [name] was evaluated in Emergency Department on 04/30/21  for the symptoms described in the history of present illness. He/she was evaluated in the context of the global COVID-19 pandemic, which necessitated consideration that the patient might be at risk for infection with the SARS-CoV-2 virus that causes COVID-19. Institutional protocols and algorithms that pertain to the evaluation of patients at risk for COVID-19 are in a state of rapid change based on information released by regulatory bodies including the CDC and federal and state organizations. These policies and algorithms were followed during the patient's care in the ED.  87 year old male with past medical history of GERD, hypertension presenting to the ED for shortness of breath.  Diagnosed with COVID on 04/14/2021 and continues to be symptomatic.  Seen and evaluated here 3 days ago, told he had pneumonia and placed on doxycycline.  He has been taking this along with antitussives and albuterol but continues to be symptomatic.  On exam patient tachycardic to low 100s and tachypneic.  He is in no respiratory distress.  Abdomen is soft.  No lower extremity edema noted bilaterally.  Oxygen saturations above 95% on room air.  Will obtain lab work, repeat x-ray and reassess.  EKG shows sinus tachycardia, no STEMI.  Initial troponin is elevated to 149.  He denies any chest pain or cardiac history.  Chest x-ray shows continued pneumonia.  Due to his history of COVID concern for PE and CT angio was done.  He does have a slight elevation in his creatinine from his baseline, he totally has a history of CKD.  His GFR  is 23 here although I feel that getting a CT angio to rule out PE is beneficial in this patient with recent COVID infection and oxygen saturations below 95% as well as continued shortness of breath.  PE study is negative but does continue to show patchy airspace consolidations.  His delta troponin is 133.  I have consulted cardiology to see him at the bedside but will admit to medicine service to trend his troponins and continue treatment.    Portions of this note were generated with [employer]. Dictation errors may occur despite best attempts at proofreading.  Final Clinical Impression(s) / ED Diagnoses Final diagnoses:  Pneumonia due to COVID-19 virus  Elevated troponin I level    Rx / DC Orders ED Discharge Orders     None        Delia Heady, PA-C 04/30/21 1653    Regan Lemming, MD 04/30/21 [phone]

## 2021-04-30 NOTE — H&P (Addendum)
History and Physical    Shane [name] GYB:[id number] DOB: 07-16-1932 DOA: 04/30/2021  PCP: Dorothyann Peng, NP (Confirm with patient/family/NH records and if not entered, this has to be entered at [facility] point of entry) Patient coming from: Home  I have personally briefly reviewed patient's old medical records in [place]  Chief Complaint: Feeling SOB  HPI: Shane [name] is a 87 y.o. male with medical history significant of MGUS, stage IV, HTN, came with worsening of shortness of breath and productive cough.  Patient started to have dry cough and shortness of breath went to tested positive for COVID on July 16, no fever or chills.  Same time he developed his right sided pleuritic chest pain, worsening with deep breath and cough.  He went to see telemedicine on July/27, was started on doxycycline twice daily and Tessalon.  Despite taking antibiotics and breathing medications over the weekend, patient developed worsening of shortness of breath and productive cough, with more clear phlegm coughing up.  He also feels nauseous but no vomiting.  Still no fever or chills, no diarrhea or abdominal pains.  ED Course: Patient was found mild hypoxic 92% on room air, desatted with activity.  Blood work showed no leukocytosis, bicarb 17, creatinine 2.6.  Troponin 149> 133.  Cardiology consulted in the ED.  EKG showed no ischemic changes.  Review of Systems: As per HPI otherwise 14 point review of systems negative.    Past Medical History:  Diagnosis Date   Allergy    Anemia, iron deficiency    C. difficile diarrhea    Cancer (HCC)    forearm-  squamous   Cataract    bil cataracts removed   DDD (degenerative disc disease), cervical    Dysplastic nevus of left upper extremity    Elevated PSA    Essential hypertension    Extrinsic asthma    GERD (gastroesophageal reflux disease)    no recent   Gout    Hiatal hernia    Macular degeneration    Neuromuscular disorder (HCC)    hiatal  hernia   Pneumonia    2014ish   Spinal stenosis     Past Surgical History:  Procedure Laterality Date   BACK SURGERY     Bone Spur Right    Shoulder   COLONOSCOPY     COLONOSCOPY W/ POLYPECTOMY     COLONOSCOPY WITH PROPOFOL N/A 11/19/2016   Procedure: COLONOSCOPY WITH PROPOFOL;  Surgeon: Doran Stabler, MD;  Location: [facility];  Service: Gastroenterology;  Laterality: N/A;   FECAL TRANSPLANT N/A 11/19/2016   Procedure: FECAL TRANSPLANT;  Surgeon: Doran Stabler, MD;  Location: [facility];  Service: Gastroenterology;  Laterality: N/A;   MOUTH SURGERY     2 teeth removed resulting in a dry socket in one of them   POLYPECTOMY     SKIN CANCER EXCISION       reports that he has quit smoking. His smoking use included cigarettes. He has never used smokeless tobacco. He reports that he does not drink alcohol and does not use drugs.  No Known Allergies  Family History  Problem Relation Age of Onset   Multiple myeloma Brother    Prostate cancer Brother    Hypertension Father        ?   Lung cancer Sister    Colon cancer Neg Hx    Stomach cancer Neg Hx    Esophageal cancer Neg Hx    Rectal cancer Neg  Hx      Prior to Admission medications   Medication Sig Start Date End Date Taking? Authorizing Provider  acetaminophen (TYLENOL) 500 MG tablet Take 500-1,000 mg by mouth every 6 (six) hours as needed (for pain).   Yes [provider]  albuterol (VENTOLIN HFA) 108 (90 Base) MCG/ACT inhaler Inhale 2 puffs into the lungs every 6 (six) hours as needed for wheezing or shortness of breath. 04/25/21 04/25/22 Yes Panosh, Standley Brooking, MD  allopurinol (ZYLOPRIM) 300 MG tablet Take 1 tablet (300 mg total) by mouth daily. 10/11/20  Yes Nafziger, Tommi Rumps, NP  atenolol (TENORMIN) 25 MG tablet Take 1 tablet (25 mg total) by mouth daily. 10/11/20  Yes Nafziger, Tommi Rumps, NP  benzonatate (TESSALON PERLES) 100 MG capsule Take 1 capsule (100 mg total) by mouth 3 (three) times daily as needed for  cough. 04/25/21  Yes Panosh, Standley Brooking, MD  budesonide (ENTOCORT EC) 3 MG 24 hr capsule Take 3 capsules (9 mg total) by mouth daily. 11/27/20  Yes Danis, Kirke Corin, MD  chlorthalidone (HYGROTON) 25 MG tablet Take 0.5 tablets (12.5 mg total) by mouth daily. 10/11/20  Yes Nafziger, Tommi Rumps, NP  doxycycline (VIBRAMYCIN) 100 MG capsule Take 1 capsule (100 mg total) by mouth 2 (two) times daily for 7 days. 04/27/21 05/04/21 Yes Luna Fuse, MD  finasteride (PROSCAR) 5 MG tablet Take 1 tablet (5 mg total) by mouth daily. 10/11/20  Yes Nafziger, Tommi Rumps, NP  lisinopril (ZESTRIL) 5 MG tablet TAKE ONE TABLET BY MOUTH EVERY MORNING and TAKE ONE TABLET BY MOUTH EVERYDAY AT BEDTIME 03/30/21  Yes Nafziger, Tommi Rumps, NP  Multiple Vitamins-Minerals (PRESERVISION AREDS 2) CAPS Take 1 capsule by mouth 2 (two) times daily.   Yes [provider]  simvastatin (ZOCOR) 10 MG tablet Take 1 tablet (10 mg total) by mouth at bedtime. 10/10/20  Yes Dorothyann Peng, NP    Physical Exam: Vitals:   04/30/21 1400 04/30/21 1415 04/30/21 1430 04/30/21 1700  BP: (!) 141/87 (!) 148/85 (!) 144/87 (!) 150/87  Pulse: 96 93 91 90  Resp: (!) 28  (!) 26 (!) 25  Temp:      TempSrc:      SpO2: 94% 94% 94% 94%  Weight:      Height:        Constitutional: NAD, calm, comfortable Vitals:   04/30/21 1400 04/30/21 1415 04/30/21 1430 04/30/21 1700  BP: (!) 141/87 (!) 148/85 (!) 144/87 (!) 150/87  Pulse: 96 93 91 90  Resp: (!) 28  (!) 26 (!) 25  Temp:      TempSrc:      SpO2: 94% 94% 94% 94%  Weight:      Height:       Eyes: PERRL, lids and conjunctivae normal ENMT: Mucous membranes are moist. Posterior pharynx clear of any exudate or lesions.Normal dentition.  Neck: normal, supple, no masses, no thyromegaly Respiratory: clear to auscultation bilaterally, no wheezing, B/L crackles on her bases right> left.  Increasing respiratory effort. No accessory muscle use.  Cardiovascular: Regular rate and rhythm, no murmurs / rubs / gallops. No  extremity edema. 2+ pedal pulses. No carotid bruits.  Abdomen: no tenderness, no masses palpated. No hepatosplenomegaly. Bowel sounds positive.  Musculoskeletal: no clubbing / cyanosis. No joint deformity upper and lower extremities. Good ROM, no contractures. Normal muscle tone.  Skin: no rashes, lesions, ulcers. No induration Neurologic: CN 2-12 grossly intact. Sensation intact, DTR normal. Strength 5/5 in all 4.  Psychiatric: Normal judgment and insight. Alert  and oriented x 3. Normal mood.     Labs on Admission: I have personally reviewed following labs and imaging studies  CBC: Recent Labs  Lab 04/27/21 1011 04/30/21 1111  WBC 5.8 5.1  NEUTROABS 3.8 3.4  HGB 14.0 14.2  HCT 42.4 44.8  MCV 93.2 95.1  PLT 131* 161*   Basic Metabolic Panel: Recent Labs  Lab 04/27/21 1011 04/30/21 1111  NA 139 139  K 3.6 3.7  CL 111 110  CO2 17* 17*  GLUCOSE 117* 111*  BUN 58* 63*  CREATININE 2.31* 2.67*  CALCIUM 9.0 8.9   GFR: Estimated Creatinine Clearance: 19.9 mL/min (A) (by C-G formula based on SCr of 2.67 mg/dL (H)). Liver Function Tests: Recent Labs  Lab 04/27/21 1011 04/30/21 1111  AST 31 45*  ALT 20 21  ALKPHOS 22* 23*  BILITOT 0.9 1.2  PROT 6.3* 6.3*  ALBUMIN 2.9* 2.6*   No results for input(s): LIPASE, AMYLASE in the last 168 hours. No results for input(s): AMMONIA in the last 168 hours. Coagulation Profile: No results for input(s): INR, PROTIME in the last 168 hours. Cardiac Enzymes: No results for input(s): CKTOTAL, CKMB, CKMBINDEX, TROPONINI in the last 168 hours. BNP (last 3 results) No results for input(s): PROBNP in the last 8760 hours. HbA1C: No results for input(s): HGBA1C in the last 72 hours. CBG: No results for input(s): GLUCAP in the last 168 hours. Lipid Profile: No results for input(s): CHOL, HDL, LDLCALC, TRIG, CHOLHDL, LDLDIRECT in the last 72 hours. Thyroid Function Tests: No results for input(s): TSH, T4TOTAL, FREET4, T3FREE, THYROIDAB in  the last 72 hours. Anemia Panel: No results for input(s): VITAMINB12, FOLATE, FERRITIN, TIBC, IRON, RETICCTPCT in the last 72 hours. Urine analysis:    Component Value Date/Time   COLORURINE yellow 08/17/2010 0909   APPEARANCEUR Clear 08/17/2010 0909   LABSPEC >=1.030 08/17/2010 0909   PHURINE 5.0 08/17/2010 0909   HGBUR negative 08/17/2010 0909   BILIRUBINUR n 12/01/2015 1139   PROTEINUR n 12/01/2015 1139   UROBILINOGEN 0.2 12/01/2015 1139   UROBILINOGEN 0.2 08/17/2010 0909   NITRITE n 12/01/2015 1139   NITRITE negative 08/17/2010 0909   LEUKOCYTESUR Negative 12/01/2015 1139    Radiological Exams on Admission: DG Chest 2 View  Result Date: 04/30/2021 CLINICAL DATA:  Shortness of breath, COVID EXAM: CHEST - 2 VIEW COMPARISON:  04/27/2021 FINDINGS: Left basilar airspace opacity with small left effusion again noted, not significantly changed. No confluent opacity on the right. Heart is borderline in size. Aortic atherosclerosis. No acute bony abnormality. IMPRESSION: Continued left basilar opacity concerning for pneumonia. Small left effusion. No real change. Electronically Signed   By: Rolm Baptise M.D.   On: 04/30/2021 11:20   CT Angio Chest PE W/Cm &/Or Wo Cm  Result Date: 04/30/2021 CLINICAL DATA:  Shortness of breath, COVID-19 positive. PE suspected EXAM: CT ANGIOGRAPHY CHEST WITH CONTRAST TECHNIQUE: Multidetector CT imaging of the chest was performed using the standard protocol during bolus administration of intravenous contrast. Multiplanar CT image reconstructions and MIPs were obtained to evaluate the vascular anatomy. CONTRAST:  68m OMNIPAQUE IOHEXOL 350 MG/ML SOLN COMPARISON:  Same day chest x-ray FINDINGS: Cardiovascular: Satisfactory opacification of the pulmonary arteries to the segmental level. No evidence of pulmonary embolism. Thoracic aorta is nonaneurysmal. Atherosclerotic calcifications of the aorta and coronary arteries. Heart size is borderline enlarged. No pericardial  effusion. Mediastinum/Nodes: No enlarged mediastinal, hilar, or axillary lymph nodes. Thyroid gland, trachea, and esophagus demonstrate no significant findings. Lungs/Pleura: Patchy airspace consolidation within  the basilar segment of the left lower lobe. Subsegmental atelectasis within the dependent right lower lobe. Minimal ground-glass opacity within the bilateral upper lobes. No pleural effusion or pneumothorax. Upper Abdomen: Small hiatal hernia. Musculoskeletal: No chest wall abnormality. No acute or significant osseous findings. Multilevel bridging endplate osteophytes anteriorly within the thoracic spine suggesting diffuse idiopathic skeletal hyperostosis. Review of the MIP images confirms the above findings. IMPRESSION: 1. No evidence of pulmonary embolism. 2. Patchy airspace consolidation within the basilar segment of the left lower lobe, suspicious for pneumonia. 3. Minimal ground-glass opacity within the bilateral upper lobes, which may represent additional sites of atypical/viral infection. 4. Aortic atherosclerosis (ICD10-I70.0). 5. Skeletal findings suggestive of DISH. Electronically Signed   By: Davina Poke D.O.   On: 04/30/2021 15:53    EKG: Independently reviewed.  Sinus, first-degree AV block.  Assessment/Plan Active Problems:   Hypoxia  (please populate well all problems here in Problem List. (For example, if patient is on BP meds at home and you resume or decide to hold them, it is a problem that needs to be her. Same for CAD, COPD, HLD and so on)  Post viral pneumonia, likely bacterial -Failed outpatient treatment.  With worsening breathing symptoms and impending hypoxic failure.  Continue doxycycline, and ceftriaxone. -Sputum culture, breathing treatment.  Uncontrolled hypertension -Question of acute diastolic CHF decompensation given CT chest showed bilateral pleural effusion where right-sided lung appears to be clean and there is no pneumonia. -Overall euvolemic to mild  overload, hold Lasix due to that the patient on hydration for kidney protection after today's IV contrast.  Change his BP medication regimen, discontinue chlorthalidone given this CKD stage IV, start Imdur, plan to use Imdur plus hydralazine regimen.  Use as needed hydralazine for now.  Chest pain and positive troponins -PE ruled out by CT angiogram. -Troponin elevation pattern is flat, likely demand ischemia from impending hypoxic failure.  Discussed with on-call cardiology, ordered echocardiogram.  Repeat more morning.  EKG showed no significant ST changes.  CKD stage IV -Euvolemic to mild overload.  Hold diuresis given worsening of kidney function already. -Discontinue chlorthalidone, discontinue ACEI. -Mild hydration x12 hours then reevaluate kidney function.  Close monitoring kidney function in next 2 days for contrast nephropathy.  [place] -Outpatient oncology follow-up.  COVID infection -Monitor, out of window for anti-viral Rx.  DVT prophylaxis: Heparin subcu Code Status: Full code Family Communication: Daughter at bedside Disposition Plan: Expect 2 to 3 days hospital stay to treat pneumonia and cardiology work-up. Consults called: Cardiology Admission status: Telemetry admission   Lequita Halt MD [facility] Pager [phone]  04/30/2021, 5:51 PM

## 2021-05-01 ENCOUNTER — Inpatient Hospital Stay (HOSPITAL_COMMUNITY): Payer: Medicare HMO

## 2021-05-01 ENCOUNTER — Telehealth: Payer: Medicare HMO | Admitting: Adult Health

## 2021-05-01 ENCOUNTER — Encounter (HOSPITAL_COMMUNITY): Payer: Self-pay | Admitting: Internal Medicine

## 2021-05-01 DIAGNOSIS — I5033 Acute on chronic diastolic (congestive) heart failure: Secondary | ICD-10-CM

## 2021-05-01 DIAGNOSIS — I509 Heart failure, unspecified: Secondary | ICD-10-CM

## 2021-05-01 DIAGNOSIS — R0902 Hypoxemia: Secondary | ICD-10-CM

## 2021-05-01 LAB — CBC
HCT: 36 % — ABNORMAL LOW (ref 39.0–52.0)
Hemoglobin: 11.9 g/dL — ABNORMAL LOW (ref 13.0–17.0)
MCH: 31.2 pg (ref 26.0–34.0)
MCHC: 33.1 g/dL (ref 30.0–36.0)
MCV: 94.2 fL (ref 80.0–100.0)
Platelets: 132 10*3/uL — ABNORMAL LOW (ref 150–400)
RBC: 3.82 MIL/uL — ABNORMAL LOW (ref 4.22–5.81)
RDW: 13.9 % (ref 11.5–15.5)
WBC: 5.1 10*3/uL (ref 4.0–10.5)
nRBC: 0 % (ref 0.0–0.2)

## 2021-05-01 LAB — BASIC METABOLIC PANEL
Anion gap: 10 (ref 5–15)
BUN: 65 mg/dL — ABNORMAL HIGH (ref 8–23)
CO2: 14 mmol/L — ABNORMAL LOW (ref 22–32)
Calcium: 8.3 mg/dL — ABNORMAL LOW (ref 8.9–10.3)
Chloride: 115 mmol/L — ABNORMAL HIGH (ref 98–111)
Creatinine, Ser: 2.45 mg/dL — ABNORMAL HIGH (ref 0.61–1.24)
GFR, Estimated: 25 mL/min — ABNORMAL LOW (ref >60)
Glucose, Bld: 97 mg/dL (ref 70–99)
Potassium: 3.8 mmol/L (ref 3.5–5.1)
Sodium: 139 mmol/L (ref 135–145)

## 2021-05-01 LAB — ECHOCARDIOGRAM COMPLETE
Area-P 1/2: 2.66 cm2
Calc EF: 51.8 %
Height: 69 in
P 1/2 time: 538 msec
S' Lateral: 3.2 cm
Single Plane A2C EF: 51.8 %
Single Plane A4C EF: 55.2 %
Weight: 2832 oz

## 2021-05-01 LAB — PROCALCITONIN: Procalcitonin: 0.43 ng/mL

## 2021-05-01 LAB — TROPONIN I (HIGH SENSITIVITY): Troponin I (High Sensitivity): 182 ng/L (ref <18)

## 2021-05-01 LAB — C-REACTIVE PROTEIN: CRP: 7.7 mg/dL — ABNORMAL HIGH (ref <1.0)

## 2021-05-01 LAB — BRAIN NATRIURETIC PEPTIDE: B Natriuretic Peptide: 59.8 pg/mL (ref 0.0–100.0)

## 2021-05-01 LAB — MAGNESIUM: Magnesium: 1.5 mg/dL — ABNORMAL LOW (ref 1.7–2.4)

## 2021-05-01 LAB — CK: Total CK: 222 U/L (ref 49–397)

## 2021-05-01 LAB — D-DIMER, QUANTITATIVE: D-Dimer, Quant: 1.03 ug/mL-FEU — ABNORMAL HIGH (ref 0.00–0.50)

## 2021-05-01 MED ORDER — PROSIGHT PO TABS
1.0000 | ORAL_TABLET | Freq: Every day | ORAL | Status: DC
  Administered 2021-05-01 – 2021-05-03 (×3): 1 via ORAL
  Filled 2021-05-01 (×3): qty 1

## 2021-05-01 MED ORDER — ASPIRIN 81 MG PO CHEW
81.0000 mg | CHEWABLE_TABLET | Freq: Every day | ORAL | Status: DC
  Administered 2021-05-01 – 2021-05-03 (×3): 81 mg via ORAL
  Filled 2021-05-01 (×3): qty 1

## 2021-05-01 MED ORDER — FUROSEMIDE 10 MG/ML IJ SOLN
40.0000 mg | Freq: Once | INTRAMUSCULAR | Status: AC
  Administered 2021-05-01: 40 mg via INTRAVENOUS
  Filled 2021-05-01: qty 4

## 2021-05-01 MED ORDER — IPRATROPIUM-ALBUTEROL 20-100 MCG/ACT IN AERS
1.0000 | INHALATION_SPRAY | Freq: Four times a day (QID) | RESPIRATORY_TRACT | Status: DC
  Administered 2021-05-01 – 2021-05-03 (×10): 1 via RESPIRATORY_TRACT
  Filled 2021-05-01: qty 4

## 2021-05-01 MED ORDER — MAGNESIUM SULFATE IN D5W 1-5 GM/100ML-% IV SOLN
1.0000 g | Freq: Once | INTRAVENOUS | Status: AC
  Administered 2021-05-01: 1 g via INTRAVENOUS
  Filled 2021-05-01: qty 100

## 2021-05-01 MED ORDER — POTASSIUM CHLORIDE CRYS ER 20 MEQ PO TBCR
20.0000 meq | EXTENDED_RELEASE_TABLET | Freq: Once | ORAL | Status: AC
  Administered 2021-05-01: 20 meq via ORAL
  Filled 2021-05-01: qty 1

## 2021-05-01 MED ORDER — MAGNESIUM SULFATE 2 GM/50ML IV SOLN
2.0000 g | Freq: Once | INTRAVENOUS | Status: AC
  Administered 2021-05-01: 2 g via INTRAVENOUS
  Filled 2021-05-01: qty 50

## 2021-05-01 NOTE — Progress Notes (Signed)
  Echocardiogram 2D Echocardiogram has been performed.  Randa Lynn Ekansh Sherk 05/01/2021, 4:45 PM

## 2021-05-01 NOTE — Progress Notes (Signed)
PROGRESS NOTE                                                                                                                                                                                                             Patient Demographics:    Shane [name], is a 87 y.o. male, DOB - 1932-06-09, MWN:[id number]  Outpatient Primary MD for the patient is Dorothyann Peng, NP    LOS - 1  Admit date - 04/30/2021    Chief Complaint  Patient presents with   Shortness of Breath       Brief Narrative (HPI from H&P)  - Shane [name] is a 87 y.o. male with medical history significant of MGUS, stage IV, HTN, came with worsening of shortness of breath and productive cough with white frothy phlegm, of note he was diagnosed with COVID-19 on July 16 but had no symptoms, he is fully vaccinated and boosted.  In the ER he was diagnosed with pneumonia, hypoxic respiratory failure and admitted to the hospital   Subjective:    Shane [name] today has, No headache, No chest pain, No abdominal pain - No Nausea, No new weakness tingling or numbness, mild SOB.   Assessment  & Plan :      Acute Hypoxic Resp. Failure due to unity acquired pneumonia along with acute on chronic CHF - he has been placed on appropriate antibiotics, on exam he has crackles and gives history of orthopnea as well, will get an echocardiogram and challenge him with Lasix.  Hold further IV fluids.  Monitor with supportive care he may require home oxygen upon discharge.  2.  Incidental COVID-19 infection on July 16.  Fully vaccinated and boosted never had symptoms due to COVID-19, clinically resolved.  3.  Mild elevation of troponin trend is flat and in non-ACS pattern.  No chest pain nonacute EKG.  Get echocardiogram, likely mild bump in troponin due to hypoxia and demand mismatch.  He is on statin, beta-blocker will add aspirin.  4.  Dyslipidemia.  On statin.  5. CKD IV with  AKI.  Baseline creatinine around 2.  Currently evidence of fluid overload, gentle diuresis and monitor.  6. Bilateral Hearing Loss.  Requested daughter to bring hearing aids.  At risk for delirium.  7.  Hypertension.  Placed on beta-blocker continue.  ACE inhibitor on hold  due to AKI.      Condition -  Guarded  Family Communication  :  daughter bedside 05/01/21  Code Status :  Full  Consults  :  None  PUD Prophylaxis :   Procedures  :     TTE      Disposition Plan  :    Status is: Inpatient  Remains inpatient appropriate because:IV treatments appropriate due to intensity of illness or inability to take PO  Dispo: The patient is from: Home              Anticipated d/c is to: Home              Patient currently is not medically stable to d/c.   Difficult to place patient No   DVT Prophylaxis  :    heparin injection 5,000 Units Start: 04/30/21 2200    Lab Results  Component Value Date   PLT 132 (L) 05/01/2021    Diet :  Diet Order             Diet Heart Room service appropriate? Yes; Fluid consistency: Thin; Fluid restriction: 2000 mL Fluid  Diet effective now                    Inpatient Medications  Scheduled Meds:  allopurinol  300 mg Oral Daily   atenolol  25 mg Oral Daily   doxycycline  100 mg Oral BID   finasteride  5 mg Oral Daily   guaiFENesin  1,200 mg Oral BID   heparin  5,000 Units Subcutaneous Q12H   Ipratropium-Albuterol  1 puff Inhalation Q6H   isosorbide mononitrate  15 mg Oral Daily   multivitamin  1 tablet Oral Daily   simvastatin  10 mg Oral QHS   sodium bicarbonate  650 mg Oral BID   Continuous Infusions:  cefTRIAXone (ROCEPHIN)  IV Stopped (04/30/21 1855)   magnesium sulfate bolus IVPB 2 g (05/01/21 1118)   PRN Meds:.acetaminophen, albuterol, benzonatate, hydrALAZINE, [DISCONTINUED] ondansetron **OR** ondansetron (ZOFRAN) IV  Antibiotics  :    Anti-infectives (From admission, onward)    Start     Dose/Rate Route  Frequency Ordered Stop   04/30/21 2200  doxycycline (VIBRA-TABS) tablet 100 mg        100 mg Oral 2 times daily 04/30/21 1743     04/30/21 1800  cefTRIAXone (ROCEPHIN) 1 g in sodium chloride 0.9 % 100 mL IVPB        1 g 200 mL/hr over 30 Minutes Intravenous Every 24 hours 04/30/21 1743          Time Spent in minutes  30   Lala Lund M.D on 05/01/2021 at 11:42 AM  To page go to [email or web address]   [facility] -  Office  [phone]     See all Orders from today for further details    Objective:   Vitals:   05/01/21 0200 05/01/21 0241 05/01/21 0440 05/01/21 0813  BP: 93/62 127/72 (!) 102/58 (!) 113/59  Pulse: 74 70 67 64  Resp: (!) 33 (!) 24 (!) 23 20  Temp: 98 F (36.7 C) 98.2 F (36.8 C) 98.1 F (36.7 C) (!) 97.3 F (36.3 C)  TempSrc: Oral Oral Oral Oral  SpO2: 92% 94% 92% (!) 88%  Weight:      Height:        Wt Readings from Last 3 Encounters:  04/30/21 80.3 kg  04/25/21 88.9 kg  01/09/21 88.9 kg     Intake/Output  Summary (Last 24 hours) at 05/01/2021 1142 Last data filed at 05/01/2021 0300 Gross per 24 hour  Intake 1868.31 ml  Output --  Net 1868.31 ml     Physical Exam  Awake Alert, No new F.N deficits, Normal affect [place].AT,PERRAL Supple Neck,No JVD, No cervical lymphadenopathy appriciated.  Symmetrical Chest wall movement, Good air movement bilaterally, bilat rales RRR,No Gallops,Rubs or new Murmurs, No Parasternal Heave +ve B.Sounds, Abd Soft, No tenderness, No organomegaly appriciated, No rebound - guarding or rigidity. No Cyanosis, Clubbing or edema, No new Rash or bruise      Data Review:    CBC Recent Labs  Lab 04/27/21 1011 04/30/21 1111 05/01/21 0257  WBC 5.8 5.1 5.1  HGB 14.0 14.2 11.9*  HCT 42.4 44.8 36.0*  PLT 131* 144* 132*  MCV 93.2 95.1 94.2  MCH 30.8 30.1 31.2  MCHC 33.0 31.7 33.1  RDW 13.9 13.8 13.9  LYMPHSABS 1.1 0.9  --   MONOABS 0.8 0.8  --   EOSABS 0.0 0.0  --   BASOSABS 0.0 0.0  --     Recent Labs  Lab  04/27/21 1011 04/30/21 1111 05/01/21 0257 05/01/21 0742  NA 139 139 139  --   K 3.6 3.7 3.8  --   CL 111 110 115*  --   CO2 17* 17* 14*  --   GLUCOSE 117* 111* 97  --   BUN 58* 63* 65*  --   CREATININE 2.31* 2.67* 2.45*  --   CALCIUM 9.0 8.9 8.3*  --   AST 31 45*  --   --   ALT 20 21  --   --   ALKPHOS 22* 23*  --   --   BILITOT 0.9 1.2  --   --   ALBUMIN 2.9* 2.6*  --   --   MG  --   --   --  1.5*  CRP  --   --   --  7.7*  DDIMER  --   --   --  1.03*  PROCALCITON  --   --   --  0.43  LATICACIDVEN 1.3  --   --   --   BNP  --   --   --  59.8    ------------------------------------------------------------------------------------------------------------------ No results for input(s): CHOL, HDL, LDLCALC, TRIG, CHOLHDL, LDLDIRECT in the last 72 hours.  No results found for: HGBA1C ------------------------------------------------------------------------------------------------------------------ No results for input(s): TSH, T4TOTAL, T3FREE, THYROIDAB in the last 72 hours.  Invalid input(s): FREET3  Cardiac Enzymes No results for input(s): CKMB, TROPONINI, MYOGLOBIN in the last 168 hours.  Invalid input(s): CK ------------------------------------------------------------------------------------------------------------------    Component Value Date/Time   BNP 59.8 05/01/2021 0742    Micro Results No results found for this or any previous visit (from the past 240 hour(s)).  Radiology Reports DG Chest 2 View  Result Date: 04/30/2021 CLINICAL DATA:  Shortness of breath, COVID EXAM: CHEST - 2 VIEW COMPARISON:  04/27/2021 FINDINGS: Left basilar airspace opacity with small left effusion again noted, not significantly changed. No confluent opacity on the right. Heart is borderline in size. Aortic atherosclerosis. No acute bony abnormality. IMPRESSION: Continued left basilar opacity concerning for pneumonia. Small left effusion. No real change. Electronically Signed   By: Rolm Baptise M.D.   On: 04/30/2021 11:20   DG Chest 2 View  Result Date: 04/27/2021 CLINICAL DATA:  Cough EXAM: CHEST - 2 VIEW COMPARISON:  Radiograph 10/07/2017 FINDINGS: Unchanged, enlarged cardiac silhouette. There are left basilar opacities and  a small left pleural effusion. No visible pneumothorax. No acute osseous abnormality. Thoracic spondylosis. IMPRESSION: Left basilar pneumonia and small left pleural effusion. Electronically Signed   By: Maurine Simmering   On: 04/27/2021 10:52   CT Angio Chest PE W/Cm &/Or Wo Cm  Result Date: 04/30/2021 CLINICAL DATA:  Shortness of breath, COVID-19 positive. PE suspected EXAM: CT ANGIOGRAPHY CHEST WITH CONTRAST TECHNIQUE: Multidetector CT imaging of the chest was performed using the standard protocol during bolus administration of intravenous contrast. Multiplanar CT image reconstructions and MIPs were obtained to evaluate the vascular anatomy. CONTRAST:  45mL OMNIPAQUE IOHEXOL 350 MG/ML SOLN COMPARISON:  Same day chest x-ray FINDINGS: Cardiovascular: Satisfactory opacification of the pulmonary arteries to the segmental level. No evidence of pulmonary embolism. Thoracic aorta is nonaneurysmal. Atherosclerotic calcifications of the aorta and coronary arteries. Heart size is borderline enlarged. No pericardial effusion. Mediastinum/Nodes: No enlarged mediastinal, hilar, or axillary lymph nodes. Thyroid gland, trachea, and esophagus demonstrate no significant findings. Lungs/Pleura: Patchy airspace consolidation within the basilar segment of the left lower lobe. Subsegmental atelectasis within the dependent right lower lobe. Minimal ground-glass opacity within the bilateral upper lobes. No pleural effusion or pneumothorax. Upper Abdomen: Small hiatal hernia. Musculoskeletal: No chest wall abnormality. No acute or significant osseous findings. Multilevel bridging endplate osteophytes anteriorly within the thoracic spine suggesting diffuse idiopathic skeletal hyperostosis. Review  of the MIP images confirms the above findings. IMPRESSION: 1. No evidence of pulmonary embolism. 2. Patchy airspace consolidation within the basilar segment of the left lower lobe, suspicious for pneumonia. 3. Minimal ground-glass opacity within the bilateral upper lobes, which may represent additional sites of atypical/viral infection. 4. Aortic atherosclerosis (ICD10-I70.0). 5. Skeletal findings suggestive of DISH. Electronically Signed   By: Davina Poke D.O.   On: 04/30/2021 15:53   DG Chest Port 1 View  Result Date: 05/01/2021 CLINICAL DATA:  Shortness of breath.  COVID. EXAM: PORTABLE CHEST 1 VIEW COMPARISON:  CT chest 04/30/2021.  Chest x-ray 04/30/2021 FINDINGS: Mediastinum is stable. Cardiomegaly. Progressive bibasilar pulmonary infiltrates/edema. Small bilateral pleural effusions. No pneumothorax. Degenerative change thoracic spine. IMPRESSION: 1.  Cardiomegaly. 2. Progressive bibasilar pulmonary infiltrates/edema. Small bilateral pleural effusions. Electronically Signed   By: Marcello Moores  Register   On: 05/01/2021 08:31

## 2021-05-01 NOTE — Evaluation (Signed)
Physical Therapy Evaluation Patient Details Name: Shane [name] MRN: [id number] DOB: [birth year] Today's Date: 05/01/2021   History of Present Illness  87yo male admitted 04/30/21 with progressive pleuritic chest pain and cough after testing positive for Covid 04/14/21. Found to be hypoxic and with post viral PNA. PMH C-diff, HTN, asthma, gout, macular degeneration, spinal stenosis, back surgery  Clinical Impression   Patient received in bed, very pleasant and cooperative but quite HOH. Daughter present and assisted in communication and mobility. Did need as much as ModA for bed mobility and transfers, however once on his feet able to gait train short distances in room with only min guard. Did need 2LPM to maintain sats at 88% and over with activity (see sat note). Left up in recliner with all needs met, daughter present. Would benefit from SNF but likely to decline- so in this case will need HHPT, 24/7A, and DME as below.     Follow Up Recommendations Home health PT;Supervision/Assistance - 24 hour    Equipment Recommendations  Rolling walker with 5" wheels;3in1 (PT)    Recommendations for Other Services       Precautions / Restrictions Precautions Precautions: Fall;Other (comment) Precaution Comments: watch sats, Covid + Restrictions Weight Bearing Restrictions: No      Mobility  Bed Mobility Overal bed mobility: Needs Assistance Bed Mobility: Supine to Sit     Supine to sit: Mod assist;HOB elevated     General bed mobility comments: ModA to boost trunk to upright midline sitting    Transfers Overall transfer level: Needs assistance Equipment used: Rolling walker (2 wheeled) Transfers: Sit to/from Stand Sit to Stand: Mod assist         General transfer comment: ModA to boost to full upright with RW, max cues for hand placement. Initially unsteady but able to gain balance with extended time/MinA  Ambulation/Gait Ambulation/Gait assistance: Min guard Gait Distance  (Feet): 20 Feet Assistive device: Rolling walker (2 wheeled) Gait Pattern/deviations: Step-through pattern;Decreased step length - right;Decreased step length - left;Trunk flexed Gait velocity: decreased   General Gait Details: slow and mildly unsteady wtih RW, but able to maintain balance with BUE support. Seems easily fatigued.  Stairs            Wheelchair Mobility    Modified Rankin (Stroke Patients Only)       Balance Overall balance assessment: Needs assistance;History of Falls Sitting-balance support: Bilateral upper extremity supported;Feet supported Sitting balance-Leahy Scale: Good Sitting balance - Comments: once sitting at midline   Standing balance support: Bilateral upper extremity supported;During functional activity Standing balance-Leahy Scale: Poor Standing balance comment: reliant on BUE support                             Pertinent Vitals/Pain Pain Assessment: No/denies pain    Home Living Family/patient expects to be discharged to:: (P) Private residence Living Arrangements: (P) Spouse/significant other Available Help at Discharge: (P) Family;Available 24 hours/day;Other (Comment) (wife available 24/5 but is 38 years old/walks with cane; daughter stays there 3 days a week) Type of Home: (P) House Home Access: (P) Stairs to enter Entrance Stairs-Rails: (P) Right;Left Entrance Stairs-Number of Steps: (P) 2 STE Home Layout: (P) Laundry or work area in basement;Multi-level [place]: (P) [place] - 2 wheels;Cane - single point Additional Comments: (P) had a fall last year doing yard work tripping over a piece of wood    Prior Function Level of Independence: (P) Independent  Comments: (P) did have a fall a couple of weeks ago- was sitting weeding, then chair shifted and he fell and had to butt scoot to the house and bang on the gutter until familiy heard and was able to help him up. Daughters have been trying to get them to go  to [place] but he does not want to go     Hand Dominance        Extremity/Trunk Assessment   Upper Extremity Assessment Upper Extremity Assessment: Defer to OT evaluation    Lower Extremity Assessment Lower Extremity Assessment: Generalized weakness    Cervical / Trunk Assessment Cervical / Trunk Assessment: Kyphotic  Communication   Communication: (P) HOH  Cognition Arousal/Alertness: Awake/alert Behavior During Therapy: WFL for tasks assessed/performed Overall Cognitive Status: Within Functional Limits for tasks assessed                                 General Comments: HOH so did not understand all cuse/commands, but followed what he was able to hear well      General Comments General comments (skin integrity, edema, etc.): desat to 84% on room air, required 2LPM to maintain at 88-91% with activity    Exercises     Assessment/Plan    PT Assessment Patient needs continued PT services  PT Problem List Decreased strength;Decreased knowledge of use of DME;Decreased activity tolerance;Decreased safety awareness;Decreased balance;Decreased mobility;Decreased coordination;Cardiopulmonary status limiting activity       PT Treatment Interventions DME instruction;Balance training;Gait training;Stair training;Functional mobility training;Patient/family education;Therapeutic activities;Therapeutic exercise    PT Goals (Current goals can be found in the Care Plan section)  Acute Rehab PT Goals Patient Stated Goal: go home when medically ready PT Goal Formulation: With patient/family Time For Goal Achievement: 05/15/21 Potential to Achieve Goals: Fair    Frequency Min 3X/week   Barriers to discharge        Co-evaluation               AM-PAC PT "6 Clicks" Mobility  Outcome Measure Help needed turning from your back to your side while in a flat bed without using bedrails?: A Little Help needed moving from lying on your back to sitting on the  side of a flat bed without using bedrails?: A Lot Help needed moving to and from a bed to a chair (including a wheelchair)?: A Little Help needed standing up from a chair using your arms (e.g., wheelchair or bedside chair)?: A Lot Help needed to walk in hospital room?: A Little Help needed climbing 3-5 steps with a railing? : A Lot 6 Click Score: 15    End of Session Equipment Utilized During Treatment: Oxygen Activity Tolerance: Patient tolerated treatment well Patient left: in chair;with call bell/phone within reach;with family/visitor present Nurse Communication: Mobility status PT Visit Diagnosis: Unsteadiness on feet (R26.81);Muscle weakness (generalized) (M62.81);Difficulty in walking, not elsewhere classified (R26.2);History of falling (Z91.81)    Time: 4888-9169 PT Time Calculation (min) (ACUTE ONLY): 38 min   Charges:   PT Evaluation $PT Eval Moderate Complexity: 1 Mod PT Treatments $Gait Training: 8-22 mins $Therapeutic Activity: 8-22 mins       Windell Norfolk, DPT, PN1   Supplemental Physical Therapist [place]    Pager [phone] Acute Rehab Office [phone]

## 2021-05-01 NOTE — Progress Notes (Signed)
Please see in addition to PT note:   SATURATION QUALIFICATIONS: (This note is used to comply with regulatory documentation for home oxygen)   Patient Saturations on Room Air at Rest = 90%    Patient Saturations on Room Air while Ambulating = 84%    Patient Saturations on 2 Liters of oxygen while Ambulating = 88-91%    Please briefly explain why patient needs home oxygen: O2 desaturation with activity on room air  Windell Norfolk, DPT, PN1   Supplemental Physical Therapist [place]    Pager [phone] Acute Rehab Office [phone]

## 2021-05-01 NOTE — Progress Notes (Signed)
Patient transferred from ED to 5W16. Airborne/contact isolation precautions continued. Patient is alert and oriented to person, place, time, and situation. Telemetry monitoring enabled, vital signs taken, and IVs assessed for patency. Skin checked with [place] Span. Fall precautions initiated. Patient bed in the locked, lowest position. Non-slip socks in place and bed alarm on. Call bell is within reach. Patient knows to call for assistance prior to getting up and patient demonstrates use. Patient is laying comfortably in bed.

## 2021-05-01 NOTE — Evaluation (Signed)
Occupational Therapy Evaluation Patient Details Name: Shane [name] MRN: [id number] DOB: 1932-01-27 Today's Date: 05/01/2021    History of Present Illness 87yo male admitted 04/30/21 with progressive pleuritic chest pain and cough after testing positive for Covid 04/14/21. Found to be hypoxic and with post viral PNA. PMH C-diff, HTN, asthma, gout, macular degeneration, spinal stenosis, back surgery   Clinical Impression   Patient is currently requiring assistance with ADLs including minimal assist with toileting, with LE dressing, and with lower body bathing, and setup/supervision for UE dressing and standing grooming, all of which is below patient's typical baseline of being Independent.  During this evaluation, patient was limited by decreased activity tolerance and decreased strength particularly to lower body, which has the potential to impact patient's safety and independence during functional mobility, as well as performance for ADLs. [place] "6-clicks" Daily Activity Inpatient Short Form score of 19/24 this session. Patient lives with his spouse, who is able to provide 24/7 supervision but no assistance.  Pt's daughter stays with them 3x/wk.  Patient demonstrates good rehab potential, and should benefit from continued skilled occupational therapy services while in acute care to maximize safety, independence and quality of life at home.  Continued occupational therapy services in the home is recommended.  ?     Follow Up Recommendations  Home health OT    Equipment Recommendations  3 in 1 bedside commode (RW with 5" wheels)    Recommendations for Other Services       Precautions / Restrictions Precautions Precautions: Fall;Other (comment) Precaution Comments: watch sats, Covid + Restrictions Weight Bearing Restrictions: No      Mobility Bed Mobility               General bed mobility comments: Pt up in recliner    Transfers Overall transfer level:  Needs assistance Equipment used: None Transfers: Sit to/from Stand Sit to Stand: Min assist         General transfer comment: Min As to Min guard to stand from recliner to bedside table with basin simulated as sink.  Pt stood for grooming activities with unilateral hand support on table.    Balance Overall balance assessment: Needs assistance;History of Falls   Sitting balance-Leahy Scale: Good     Standing balance support: Bilateral upper extremity supported;During functional activity Standing balance-Leahy Scale: Poor Standing balance comment: Fair static for grooming.  Poor dynamic based on PT eval.                           ADL either performed or assessed with clinical judgement   ADL Overall ADL's : Needs assistance/impaired Eating/Feeding: Independent   Grooming: Standing;Oral care;Wash/dry face;Set up;Supervision/safety   Upper Body Bathing: Set up;Sitting   Lower Body Bathing: Min guard;Sit to/from stand   Upper Body Dressing : Set up;Sitting   Lower Body Dressing: Min guard Lower Body Dressing Details (indicate cue type and reason): Pt struggling with leaning down to tie shoes. Pt shown figure 4 technique for greater ease and pt able to demo back to tie other shoe. Pt reported difficulty at home with dressing feet at EOB. Pt shown how to perforom modified figure 4 bringing LE up onto bed for support. Toilet Transfer: Minimal Print production planner Details (indicate cue type and reason): Pt denied need to void bowels and on foley cath. Pt did perform STS from recliner with Minimal assist to Min guard. Toileting- Clothing Manipulation and Hygiene: Minimal assistance Toileting -  Clothing Manipulation Details (indicate cue type and reason): Denied need to void. Foley. Min As based on general assessment.     Functional mobility during ADLs: Min guard;Minimal assistance       Vision Baseline Vision/History: Wears glasses Wears Glasses: At all  times Patient Visual Report: No change from baseline       Perception     Praxis      Pertinent Vitals/Pain Pain Assessment: No/denies pain     Hand Dominance     Extremity/Trunk Assessment Upper Extremity Assessment Upper Extremity Assessment: Overall WFL for tasks assessed   Lower Extremity Assessment Lower Extremity Assessment: Generalized weakness   Cervical / Trunk Assessment Cervical / Trunk Assessment: Kyphotic   Communication Communication Communication: No difficulties   Cognition Arousal/Alertness: Awake/alert Behavior During Therapy: WFL for tasks assessed/performed Overall Cognitive Status: Within Functional Limits for tasks assessed                                 General Comments: HOH so did not understand all cuse/commands, but followed what he was able to hear well   General Comments  Desat to 87% on 1L during functional standing activity. Recovered to 95% while still standing and maintained once sitting. Pt reported "I feel good".    Exercises     Shoulder Instructions      Home Living Family/patient expects to be discharged to:: Private residence Living Arrangements: Spouse/significant other Available Help at Discharge: Family;Available 24 hours/day (Spouse can supervise but not physicaly assist)   Home Access: Stairs to enter Entrance Stairs-Number of Steps: 2 Entrance Stairs-Rails: Right;Left Home Layout: Multi-level;Laundry or work area in basement;Able to live on main level with bedroom/bathroom               Home Equipment: Environmental consultant - 2 wheels;Cane - single point          Prior Functioning/Environment Level of Independence: Independent        Comments: Pt reports independence with gait and ADLs prior to admission. Pt does endorse one recent fall in garden while sitting on stool for weeding anf stool upended. Pt bottom scooted to gutter and banged "SOS" code on gutter until daughter came outside to assist.         OT Problem List: Cardiopulmonary status limiting activity;Decreased activity tolerance;Impaired balance (sitting and/or standing);Decreased strength;Decreased knowledge of use of DME or AE      OT Treatment/Interventions: Self-care/ADL training;Therapeutic exercise;Therapeutic activities;Energy conservation;Patient/family education;DME and/or AE instruction;Balance training    OT Goals(Current goals can be found in the care plan section) Acute Rehab OT Goals Patient Stated Goal: go home when medically ready OT Goal Formulation: With patient Time For Goal Achievement: 05/15/21 Potential to Achieve Goals: Good ADL Goals Pt Will Perform Lower Body Dressing: with modified independence;sit to/from stand Pt Will Transfer to Toilet: with modified independence;ambulating Pt Will Perform Toileting - Clothing Manipulation and hygiene: with modified independence;sit to/from stand;sitting/lateral leans Additional ADL Goal #1: Pt will engage in 10 min standing functional activities with VSS and without loss of balance using LRAD, in order to demonstrate improved activity tolerance and balance needed to perform ADLs safely at home.  RPE <5/10 Additional ADL Goal #2: Patient will identify at least 3 energy conservation strategies to employ at home in order to maximize function and quality of life and decrease caregiver burden while preventing exacerbation of symptoms and rehospitalization. Additional ADL Goal #3: Patient will identify at  least 3 fall prevention strategies to employ at home in order to maximize function and safety during ADLs and decrease caregiver burden while preventing possible injury and rehospitalization.  OT Frequency: Min 2X/week   Barriers to D/C:    Daughter available 3x/day and otherwise PRN. Spouse cannot assist but can supervise.       Co-evaluation              AM-PAC OT "6 Clicks" Daily Activity     Outcome Measure Help from another person eating meals?: None Help  from another person taking care of personal grooming?: A Little Help from another person toileting, which includes using toliet, bedpan, or urinal?: A Little Help from another person bathing (including washing, rinsing, drying)?: A Little Help from another person to put on and taking off regular upper body clothing?: A Little Help from another person to put on and taking off regular lower body clothing?: A Little 6 Click Score: 19   End of Session Equipment Utilized During Treatment: Oxygen Nurse Communication: Mobility status  Activity Tolerance: Patient tolerated treatment well Patient left: in chair;with call bell/phone within reach;with chair alarm set  OT Visit Diagnosis: History of falling (Z91.81);Muscle weakness (generalized) (M62.81)                Time: 9323-5573 OT Time Calculation (min): 23 min Charges:  OT General Charges $OT Visit: 1 Visit OT Evaluation $OT Eval Low Complexity: 1 Low OT Treatments $Self Care/Home Management : 8-22 mins  Anderson [place], [place] Office: [phone] 05/01/2021  Julien Girt 05/01/2021, 2:34 PM

## 2021-05-01 NOTE — Progress Notes (Signed)
Eval complete, documentation pending. Able to mobilize on a Min-ModA level with RW in room. Spo2 as low as 84% on room air, required 2LPM O2 to sat at 88-91% with activity.  Would benefit from SNF- but likely to refuse. In this case will need HHPT, 24/7A, as well as RW and 3 in 1.   Windell Norfolk, DPT, PN1   Supplemental Physical Therapist [facility]    Pager [phone] Acute Rehab Office [phone]

## 2021-05-01 NOTE — ED Notes (Signed)
Pt declines neb at this time

## 2021-05-01 NOTE — Consult Note (Signed)
Cardiology Consultation:   Patient ID: Shane [name] MRN: [id number]; DOB: [birth year]  Admit date: 04/30/2021 Date of Consult: 05/01/2021  PCP:  Shane Peng, NP   [facility] Providers Cardiologist:  None        Patient Profile:   Shane [name] is a 87 y.o. male without a prior cardiac history who is being seen 05/01/2021 for the evaluation of elevated troponin and respiratory failure at the request of Dr. Candiss [name].  History of Present Illness:   Shane [name]  was admitted on 8/1 with cough and SOB.  He had had incidental asymptomatic Covid noted on July 16th.  He was admitted with hypoxic respiratory failure.  He is being treated for pneumonia.  Also found to have elevated trop as below and increased Creat.  EKG without acute changes.  However, the EKG does have anterolateral Q waves that were not present previously in 2017.  He is thought to have some pulmonary edema and diuretics have been started. We are called to assist with fluid management and given the increased trop.   Of note initially, given the increased creat he was gently hydrated and chlorthalidone and his lisinopril where held.  His BNP on admission was normal.   There was no vascular congestion on CXR but there was a small left pleural effusion on the side of left basilar pneumonia.  However, he developed some crackles.  Echo has been ordered but not done yet.    Patient has no past cardiac history.  Prior to this acute illness he reports that he was able to do activities such as trimming with a weedeater and doing some yard work.  He drives and he does the grocery shopping.  He said he was not having any cardiovascular symptoms.  He had been exposed to somebody with COVID and thought he might have it and in fact tested positive.  He does report that he had some mild shortness of breath.  He had some mild cough really nonproductive.  He had some mild nausea.  However, he has not been describing any chest pressure, neck or arm  discomfort.  He had not been having any palpitations, presyncope or syncope.  He had not been having any weight gain or edema.  He knows of his renal insufficiency and this has been followed.  Past Medical History:  Diagnosis Date   Allergy    Anemia, iron deficiency    C. difficile diarrhea    Cancer (HCC)    forearm-  squamous   Cataract    bil cataracts removed   DDD (degenerative disc disease), cervical    Dysplastic nevus of left upper extremity    Elevated PSA    Essential hypertension    Extrinsic asthma    GERD (gastroesophageal reflux disease)    no recent   Gout    Hiatal hernia    Macular degeneration    Neuromuscular disorder (HCC)    hiatal hernia   Pneumonia    2014ish   Spinal stenosis     Past Surgical History:  Procedure Laterality Date   BACK SURGERY     Bone Spur Right    Shoulder   COLONOSCOPY     COLONOSCOPY W/ POLYPECTOMY     COLONOSCOPY WITH PROPOFOL N/A 11/19/2016   Procedure: COLONOSCOPY WITH PROPOFOL;  Surgeon: Shane Stabler, MD;  Location: [facility];  Service: Gastroenterology;  Laterality: N/A;   FECAL TRANSPLANT N/A 11/19/2016   Procedure: FECAL TRANSPLANT;  Surgeon: Shane Mussel  Evern Bio, MD;  Location: [facility];  Service: Gastroenterology;  Laterality: N/A;   MOUTH SURGERY     2 teeth removed resulting in a dry socket in one of them   POLYPECTOMY     SKIN CANCER EXCISION       Home Medications:  Prior to Admission medications   Medication Sig Start Date End Date Taking? Authorizing Provider  acetaminophen (TYLENOL) 500 MG tablet Take 500-1,000 mg by mouth every 6 (six) hours as needed (for pain).   Yes [provider]  albuterol (VENTOLIN HFA) 108 (90 Base) MCG/ACT inhaler Inhale 2 puffs into the lungs every 6 (six) hours as needed for wheezing or shortness of breath. 04/25/21 04/25/22 Yes [name], Shane Brooking, MD  allopurinol (ZYLOPRIM) 300 MG tablet Take 1 tablet (300 mg total) by mouth daily. 10/11/20  Yes [name], Shane Rumps, NP   atenolol (TENORMIN) 25 MG tablet Take 1 tablet (25 mg total) by mouth daily. 10/11/20  Yes [name], Shane Rumps, NP  benzonatate (TESSALON PERLES) 100 MG capsule Take 1 capsule (100 mg total) by mouth 3 (three) times daily as needed for cough. 04/25/21  Yes [name], Shane Brooking, MD  budesonide (ENTOCORT EC) 3 MG 24 hr capsule Take 3 capsules (9 mg total) by mouth daily. 11/27/20  Yes [name], Shane Corin, MD  chlorthalidone (HYGROTON) 25 MG tablet Take 0.5 tablets (12.5 mg total) by mouth daily. 10/11/20  Yes [name], Shane Rumps, NP  doxycycline (VIBRAMYCIN) 100 MG capsule Take 1 capsule (100 mg total) by mouth 2 (two) times daily for 7 days. 04/27/21 05/04/21 Yes Shane Fuse, MD  finasteride (PROSCAR) 5 MG tablet Take 1 tablet (5 mg total) by mouth daily. 10/11/20  Yes [name], Shane Rumps, NP  lisinopril (ZESTRIL) 5 MG tablet TAKE ONE TABLET BY MOUTH EVERY MORNING and TAKE ONE TABLET BY MOUTH EVERYDAY AT BEDTIME 03/30/21  Yes [name], Shane Rumps, NP  Multiple Vitamins-Minerals (PRESERVISION AREDS 2) CAPS Take 1 capsule by mouth 2 (two) times daily.   Yes [provider]  simvastatin (ZOCOR) 10 MG tablet Take 1 tablet (10 mg total) by mouth at bedtime. 10/10/20  Yes [name], Shane Rumps, NP    Inpatient Medications: Scheduled Meds:  allopurinol  300 mg Oral Daily   aspirin  81 mg Oral Daily   atenolol  25 mg Oral Daily   doxycycline  100 mg Oral BID   finasteride  5 mg Oral Daily   guaiFENesin  1,200 mg Oral BID   heparin  5,000 Units Subcutaneous Q12H   Ipratropium-Albuterol  1 puff Inhalation Q6H   isosorbide mononitrate  15 mg Oral Daily   multivitamin  1 tablet Oral Daily   simvastatin  10 mg Oral QHS   sodium bicarbonate  650 mg Oral BID   Continuous Infusions:  cefTRIAXone (ROCEPHIN)  IV Stopped (04/30/21 1855)   magnesium sulfate bolus IVPB 1 g (05/01/21 1316)   PRN Meds: acetaminophen, albuterol, benzonatate, hydrALAZINE, [DISCONTINUED] ondansetron **OR** ondansetron (ZOFRAN) IV  Allergies:   No Known  Allergies  Social History:   Social History   Socioeconomic History   Marital status: Married    Spouse name: Not on file   Number of children: 3   Years of education: Not on file   Highest education level: Not on file  Occupational History   Occupation: retired  Tobacco Use   Smoking status: Former    Years: 35.00    Types: Cigarettes   Smokeless tobacco: Never   Tobacco comments:    quit  late 1980's  Vaping Use   Vaping Use: Never used  Substance and Sexual Activity   Alcohol use: No   Drug use: No   Sexual activity: Not on file  Other Topics Concern   Not on file  Social History Narrative   Retired from Psychologist, educational    Married for 55 years    Three children all live locally    Likes to play golf and go to [employer].    Social Determinants of Health   Financial Resource Strain: Low Risk    Difficulty of Paying Living Expenses: Not hard at all  Food Insecurity: Not on file  Transportation Needs: No Transportation Needs   Lack of Transportation (Medical): No   Lack of Transportation (Non-Medical): No  Physical Activity: Inactive   Days of Exercise per Week: 0 days   Minutes of Exercise per Session: 0 min  Stress: No Stress Concern Present   Feeling of Stress : Not at all  Social Connections: Moderately Integrated   Frequency of Communication with Friends and Family: Twice a week   Frequency of Social Gatherings with Friends and Family: Once a week   Attends Religious Services: More than 4 times per year   Active Member of [employer] or Organizations: No   Attends Archivist Meetings: Never   Marital Status: Married  Human resources officer Violence: Not At Risk   Fear of Current or Ex-Partner: No   Emotionally Abused: No   Physically Abused: No   Sexually Abused: No    Family History:    Family History  Problem Relation Age of Onset   Multiple myeloma Brother    Prostate cancer Brother    Hypertension Father        ?   Lung cancer Sister    Colon  cancer Neg Hx    Stomach cancer Neg Hx    Esophageal cancer Neg Hx    Rectal cancer Neg Hx      ROS:  Please see the history of present illness.   All other ROS reviewed and negative.     Physical Exam/Data:   Vitals:   05/01/21 0241 05/01/21 0440 05/01/21 0813 05/01/21 1354  BP: 127/72 (!) 102/58 (!) 113/59 118/65  Pulse: 70 67 64 65  Resp: (!) 24 (!) _0 Temp: 98.2 F (36.8 C) 98.1 F (36.7 C) (!) 97.3 F (36.3 C) (!) 97.1 F (36.2 C)  TempSrc: Oral Oral Oral Oral  SpO2: 94% 92% (!) 88% (!) 88%  Weight:      Height:        Intake/Output Summary (Last 24 hours) at 05/01/2021 1413 Last data filed at 05/01/2021 0300 Gross per 24 hour  Intake 1868.31 ml  Output --  Net 1868.31 ml   Last 3 Weights 04/30/2021 04/25/2021 01/09/2021  Weight (lbs) 177 lb 196 lb 196 lb  Weight (kg) 80.287 kg 88.905 kg 88.905 kg     Body mass index is 26.14 kg/m.  General:  Well nourished, well developed, in no acute distress HEENT: normal Lymph: no adenopathy Neck: no JVD Endocrine:  No thryomegaly Vascular: No carotid bruits; FA pulses 2+ bilaterally without bruits  Cardiac:  normal S1, S2; RRR; no murmur  Lungs:    Decreased breath sounds bilaterally, few expiratory wheezes and coarse crackles Abd: soft, nontender, no hepatomegaly  Ext: no edema Musculoskeletal:  No deformities, BUE and BLE strength normal and equal Skin: warm and dry  Neuro:  CNs 2-12 intact, no focal  abnormalities noted Psych:  Normal affect   EKG:  The EKG was personally reviewed and demonstrates:  NSR, rate 107, LAD, LAFB, first degree AV block.  PVCs,  old anteroseptal MI.   Anteroseptal Q waves are new compared to previous.    Telemetry:  Telemetry was personally reviewed and demonstrates: Sinus rhythm  Relevant CV Studies: Echo pending  Laboratory Data:  High Sensitivity Troponin:   Recent Labs  Lab 04/30/21 1111 04/30/21 1216 05/01/21 0533  TROPONINIHS 149* 133* 182*     Chemistry Recent Labs   Lab 04/27/21 1011 04/30/21 1111 05/01/21 0257  NA 139 139 139  K 3.6 3.7 3.8  CL 111 110 115*  CO2 17* 17* 14*  GLUCOSE 117* 111* 97  BUN 58* 63* 65*  CREATININE 2.31* 2.67* 2.45*  CALCIUM 9.0 8.9 8.3*  GFRNONAA 27* 23* 25*  ANIONGAP _0 Recent Labs  Lab 04/27/21 1011 04/30/21 1111  PROT 6.3* 6.3*  ALBUMIN 2.9* 2.6*  AST 31 45*  ALT 20 21  ALKPHOS 22* 23*  BILITOT 0.9 1.2   Hematology Recent Labs  Lab 04/27/21 1011 04/30/21 1111 05/01/21 0257  WBC 5.8 5.1 5.1  RBC 4.55 4.71 3.82*  HGB 14.0 14.2 11.9*  HCT 42.4 44.8 36.0*  MCV 93.2 95.1 94.2  MCH 30.8 30.1 31.2  MCHC 33.0 31.7 33.1  RDW 13.9 13.8 13.9  PLT 131* 144* 132*   BNP Recent Labs  Lab 05/01/21 0742  BNP 59.8    DDimer  Recent Labs  Lab 05/01/21 0742  DDIMER 1.03*     Radiology/Studies:  DG Chest 2 View  Result Date: 04/30/2021 CLINICAL DATA:  Shortness of breath, COVID EXAM: CHEST - 2 VIEW COMPARISON:  04/27/2021 FINDINGS: Left basilar airspace opacity with small left effusion again noted, not significantly changed. No confluent opacity on the right. Heart is borderline in size. Aortic atherosclerosis. No acute bony abnormality. IMPRESSION: Continued left basilar opacity concerning for pneumonia. Small left effusion. No real change. Electronically Signed   By: Rolm Baptise M.D.   On: 04/30/2021 11:20   CT Angio Chest PE W/Cm &/Or Wo Cm  Result Date: 04/30/2021 CLINICAL DATA:  Shortness of breath, COVID-19 positive. PE suspected EXAM: CT ANGIOGRAPHY CHEST WITH CONTRAST TECHNIQUE: Multidetector CT imaging of the chest was performed using the standard protocol during bolus administration of intravenous contrast. Multiplanar CT image reconstructions and MIPs were obtained to evaluate the vascular anatomy. CONTRAST:  90m OMNIPAQUE IOHEXOL 350 MG/ML SOLN COMPARISON:  Same day chest x-ray FINDINGS: Cardiovascular: Satisfactory opacification of the pulmonary arteries to the segmental level. No  evidence of pulmonary embolism. Thoracic aorta is nonaneurysmal. Atherosclerotic calcifications of the aorta and coronary arteries. Heart size is borderline enlarged. No pericardial effusion. Mediastinum/Nodes: No enlarged mediastinal, hilar, or axillary lymph nodes. Thyroid gland, trachea, and esophagus demonstrate no significant findings. Lungs/Pleura: Patchy airspace consolidation within the basilar segment of the left lower lobe. Subsegmental atelectasis within the dependent right lower lobe. Minimal ground-glass opacity within the bilateral upper lobes. No pleural effusion or pneumothorax. Upper Abdomen: Small hiatal hernia. Musculoskeletal: No chest wall abnormality. No acute or significant osseous findings. Multilevel bridging endplate osteophytes anteriorly within the thoracic spine suggesting diffuse idiopathic skeletal hyperostosis. Review of the MIP images confirms the above findings. IMPRESSION: 1. No evidence of pulmonary embolism. 2. Patchy airspace consolidation within the basilar segment of the left lower lobe, suspicious for pneumonia. 3. Minimal ground-glass opacity within the bilateral upper lobes, which may represent additional  sites of atypical/viral infection. 4. Aortic atherosclerosis (ICD10-I70.0). 5. Skeletal findings suggestive of DISH. Electronically Signed   By: Davina Poke D.O.   On: 04/30/2021 15:53   DG Chest Port 1 View  Result Date: 05/01/2021 CLINICAL DATA:  Shortness of breath.  COVID. EXAM: PORTABLE CHEST 1 VIEW COMPARISON:  CT chest 04/30/2021.  Chest x-ray 04/30/2021 FINDINGS: Mediastinum is stable. Cardiomegaly. Progressive bibasilar pulmonary infiltrates/edema. Small bilateral pleural effusions. No pneumothorax. Degenerative change thoracic spine. IMPRESSION: 1.  Cardiomegaly. 2. Progressive bibasilar pulmonary infiltrates/edema. Small bilateral pleural effusions. Electronically Signed   By: Marcello Moores  Register   On: 05/01/2021 08:31     Assessment and Plan:     ELEVATED TROPONIN:  These are not diagnostic but he does have evidence of vascular disease with aortic atherosclerosis on his CT and his EKG suggests a possible old anterolateral MI which would be new compared to an EKG a few years ago.   He does have asymmetric edema on CXR.  I agree with 1 dose of diuretic although we will have to watch his renal function closely.  I will await the results of the echocardiogram.  His history is not consistent with distant or certainly recent myocardial infarction though the EKG is suspicious.  Further evaluation and management will be based on the results of the echo.  RESPIRATORY FAILURE:  He is being treated for pneumonia.  I suspect this is the predominant issue but would as above diurese him gently.  AKI:   He has had CKD with his creat hovering between 1.7 and 2.05 in the last months.  He is followed by nephrology.  HTN:  BP is currently low.  Continue meds as listed.  Risk Assessment/Risk Scores:        New York Heart Association (NYHA) Functional Class NYHA Class II        For questions or updates, please contact [facility] Please consult [email or web address] for contact info under    Signed, Minus Breeding, MD  05/01/2021 2:13 PM

## 2021-05-02 ENCOUNTER — Inpatient Hospital Stay (HOSPITAL_COMMUNITY): Payer: Medicare HMO

## 2021-05-02 ENCOUNTER — Telehealth: Payer: Self-pay | Admitting: Pharmacist

## 2021-05-02 DIAGNOSIS — R778 Other specified abnormalities of plasma proteins: Secondary | ICD-10-CM

## 2021-05-02 LAB — URINALYSIS, ROUTINE W REFLEX MICROSCOPIC
Bilirubin Urine: NEGATIVE
Glucose, UA: NEGATIVE mg/dL
Ketones, ur: NEGATIVE mg/dL
Leukocytes,Ua: NEGATIVE
Nitrite: NEGATIVE
Protein, ur: 100 mg/dL — AB
Specific Gravity, Urine: 1.02 (ref 1.005–1.030)
pH: 5 (ref 5.0–8.0)

## 2021-05-02 LAB — CBC WITH DIFFERENTIAL/PLATELET
Abs Immature Granulocytes: 0.04 10*3/uL (ref 0.00–0.07)
Basophils Absolute: 0 10*3/uL (ref 0.0–0.1)
Basophils Relative: 0 %
Eosinophils Absolute: 0 10*3/uL (ref 0.0–0.5)
Eosinophils Relative: 1 %
HCT: 35.6 % — ABNORMAL LOW (ref 39.0–52.0)
Hemoglobin: 11.7 g/dL — ABNORMAL LOW (ref 13.0–17.0)
Immature Granulocytes: 1 %
Lymphocytes Relative: 19 %
Lymphs Abs: 0.9 10*3/uL (ref 0.7–4.0)
MCH: 30.3 pg (ref 26.0–34.0)
MCHC: 32.9 g/dL (ref 30.0–36.0)
MCV: 92.2 fL (ref 80.0–100.0)
Monocytes Absolute: 0.7 10*3/uL (ref 0.1–1.0)
Monocytes Relative: 15 %
Neutro Abs: 3.1 10*3/uL (ref 1.7–7.7)
Neutrophils Relative %: 64 %
Platelets: 129 10*3/uL — ABNORMAL LOW (ref 150–400)
RBC: 3.86 MIL/uL — ABNORMAL LOW (ref 4.22–5.81)
RDW: 14 % (ref 11.5–15.5)
WBC: 4.9 10*3/uL (ref 4.0–10.5)
nRBC: 0 % (ref 0.0–0.2)

## 2021-05-02 LAB — COMPREHENSIVE METABOLIC PANEL
ALT: 19 U/L (ref 0–44)
AST: 39 U/L (ref 15–41)
Albumin: 2.2 g/dL — ABNORMAL LOW (ref 3.5–5.0)
Alkaline Phosphatase: 20 U/L — ABNORMAL LOW (ref 38–126)
Anion gap: 13 (ref 5–15)
BUN: 73 mg/dL — ABNORMAL HIGH (ref 8–23)
CO2: 16 mmol/L — ABNORMAL LOW (ref 22–32)
Calcium: 8.2 mg/dL — ABNORMAL LOW (ref 8.9–10.3)
Chloride: 109 mmol/L (ref 98–111)
Creatinine, Ser: 2.87 mg/dL — ABNORMAL HIGH (ref 0.61–1.24)
GFR, Estimated: 21 mL/min — ABNORMAL LOW (ref >60)
Glucose, Bld: 106 mg/dL — ABNORMAL HIGH (ref 70–99)
Potassium: 3.8 mmol/L (ref 3.5–5.1)
Sodium: 138 mmol/L (ref 135–145)
Total Bilirubin: 0.7 mg/dL (ref 0.3–1.2)
Total Protein: 5.2 g/dL — ABNORMAL LOW (ref 6.5–8.1)

## 2021-05-02 LAB — URINALYSIS, MICROSCOPIC (REFLEX)
Bacteria, UA: NONE SEEN
RBC / HPF: >50 RBC/hpf (ref 0–5)
Squamous Epithelial / HPF: NONE SEEN (ref 0–5)

## 2021-05-02 LAB — MAGNESIUM: Magnesium: 2.3 mg/dL (ref 1.7–2.4)

## 2021-05-02 LAB — OSMOLALITY, URINE: Osmolality, Ur: 402 mOsm/kg (ref 300–900)

## 2021-05-02 LAB — C-REACTIVE PROTEIN: CRP: 8.8 mg/dL — ABNORMAL HIGH (ref <1.0)

## 2021-05-02 LAB — CREATININE, URINE, RANDOM: Creatinine, Urine: 78.74 mg/dL

## 2021-05-02 LAB — PROCALCITONIN: Procalcitonin: 0.61 ng/mL

## 2021-05-02 LAB — SODIUM, URINE, RANDOM: Sodium, Ur: 79 mmol/L

## 2021-05-02 LAB — BRAIN NATRIURETIC PEPTIDE: B Natriuretic Peptide: 68.8 pg/mL (ref 0.0–100.0)

## 2021-05-02 LAB — OSMOLALITY: Osmolality: 313 mOsm/kg — ABNORMAL HIGH (ref 275–295)

## 2021-05-02 LAB — SARS CORONAVIRUS 2 (TAT 6-24 HRS): SARS Coronavirus 2: POSITIVE — AB

## 2021-05-02 LAB — URIC ACID: Uric Acid, Serum: 8.9 mg/dL — ABNORMAL HIGH (ref 3.7–8.6)

## 2021-05-02 MED ORDER — SODIUM BICARBONATE 650 MG PO TABS
650.0000 mg | ORAL_TABLET | Freq: Three times a day (TID) | ORAL | Status: DC
  Administered 2021-05-02 – 2021-05-03 (×3): 650 mg via ORAL
  Filled 2021-05-02 (×3): qty 1

## 2021-05-02 MED ORDER — ALLOPURINOL 100 MG PO TABS
200.0000 mg | ORAL_TABLET | Freq: Every day | ORAL | Status: DC
  Administered 2021-05-03: 200 mg via ORAL
  Filled 2021-05-02: qty 2

## 2021-05-02 NOTE — Chronic Care Management (AMB) (Signed)
    Chronic Care Management Pharmacy Assistant   Name: Shane [name]  MRN: [id number] DOB: [birth year]  05-02-2021- Patient due to be called to remind of appointment with Jeni Salles Clinical pharmacist on 05-03-2021 via phone call at [place]  Per notes in chart patient is currently hospitalized will make Pharmacist aware and cancel appointment. Will check in with him on next week  [facility] Clinical Pharmacist Assistant [phone]    Medications: Facility-Administered Encounter Medications as of 05/02/2021  Medication   acetaminophen (TYLENOL) tablet 500-1,000 mg   albuterol (PROVENTIL) (2.5 MG/3ML) 0.083% nebulizer solution 2.5 mg   allopurinol (ZYLOPRIM) tablet 300 mg   aspirin chewable tablet 81 mg   atenolol (TENORMIN) tablet 25 mg   benzonatate (TESSALON) capsule 100 mg   cefTRIAXone (ROCEPHIN) 1 g in sodium chloride 0.9 % 100 mL IVPB   doxycycline (VIBRA-TABS) tablet 100 mg   finasteride (PROSCAR) tablet 5 mg   guaiFENesin (MUCINEX) 12 hr tablet 1,200 mg   heparin injection 5,000 Units   hydrALAZINE (APRESOLINE) tablet 25 mg   Ipratropium-Albuterol (COMBIVENT) respimat 1 puff   isosorbide mononitrate (IMDUR) 24 hr tablet 15 mg   multivitamin (PROSIGHT) tablet 1 tablet   ondansetron (ZOFRAN) injection 4 mg   simvastatin (ZOCOR) tablet 10 mg   sodium bicarbonate tablet 650 mg   Outpatient Encounter Medications as of 05/02/2021  Medication Sig Note   acetaminophen (TYLENOL) 500 MG tablet Take 500-1,000 mg by mouth every 6 (six) hours as needed (for pain).    albuterol (VENTOLIN HFA) 108 (90 Base) MCG/ACT inhaler Inhale 2 puffs into the lungs every 6 (six) hours as needed for wheezing or shortness of breath.    allopurinol (ZYLOPRIM) 300 MG tablet Take 1 tablet (300 mg total) by mouth daily.    atenolol (TENORMIN) 25 MG tablet Take 1 tablet (25 mg total) by mouth daily.    benzonatate (TESSALON PERLES) 100 MG capsule Take 1 capsule (100 mg total) by mouth 3 (three) times  daily as needed for cough.    budesonide (ENTOCORT EC) 3 MG 24 hr capsule Take 3 capsules (9 mg total) by mouth daily. 04/30/2021: Pt requests refill    chlorthalidone (HYGROTON) 25 MG tablet Take 0.5 tablets (12.5 mg total) by mouth daily.    doxycycline (VIBRAMYCIN) 100 MG capsule Take 1 capsule (100 mg total) by mouth 2 (two) times daily for 7 days.    finasteride (PROSCAR) 5 MG tablet Take 1 tablet (5 mg total) by mouth daily.    lisinopril (ZESTRIL) 5 MG tablet TAKE ONE TABLET BY MOUTH EVERY MORNING and TAKE ONE TABLET BY MOUTH EVERYDAY AT BEDTIME    Multiple Vitamins-Minerals (PRESERVISION AREDS 2) CAPS Take 1 capsule by mouth 2 (two) times daily.    simvastatin (ZOCOR) 10 MG tablet Take 1 tablet (10 mg total) by mouth at bedtime.

## 2021-05-02 NOTE — TOC Initial Note (Addendum)
Transition of Care [facility]) - Initial/Assessment Note    Patient Details  Name: Shane [name] MRN: [id number] Date of Birth: 1932/01/27  [facility]) CM/SW Contact:    Carles Collet, RN Phone Number: 05/02/2021, 1:37 PM  Clinical Narrative:        Called room, no answer. Called spouse/ daughter on shared number listed, LVM requesting call back. Secure chat sent to nurse to obtain ambulatory oxygen saturations to eval and qualify for home oxygen, as ordered yesterday. CM will continue to follow        Da, [place] [phone]  [phone]  Marthenia Rolling Daughter   [phone]      Spoke w wife. She states that they would be agreeable to [facility] services. Discussed providers and Medicare ratings. Referral made to [facility], accepted. Will need HH orders for PT OT. Patient has DME RW, 3/1, and built in shower seat.  Wife states that daughter will be able to provide his transportation home.   Expected Discharge Plan: [place] Barriers to Discharge: Continued Medical Work up   Patient Goals and CMS Choice        Expected Discharge Plan and Services Expected Discharge Plan: [place]   Discharge Planning Services: CM Consult                                          Prior Living Arrangements/Services                       Activities of Daily Living Home Assistive Devices/Equipment: None ADL Screening (condition at time of admission) Patient's cognitive ability adequate to safely complete daily activities?: No Is the patient deaf or have difficulty hearing?: Yes Does the patient have difficulty seeing, even when wearing glasses/contacts?: Yes Does the patient have difficulty concentrating, remembering, or making decisions?: No Patient able to express need for assistance with ADLs?: Yes Does the patient have difficulty dressing or bathing?: No Independently performs ADLs?: No Communication:  Independent Dressing (OT): Needs assistance Is this a change from baseline?: Pre-admission baseline Grooming: Independent Feeding: Independent Bathing: Needs assistance Is this a change from baseline?: Pre-admission baseline Toileting: Independent In/Out Bed: Needs assistance Is this a change from baseline?: Pre-admission baseline Walks in Home: Independent Does the patient have difficulty walking or climbing stairs?: Yes Weakness of Legs: Both Weakness of Arms/Hands: None  Permission Sought/Granted                  Emotional Assessment              Admission diagnosis:  Hypoxia [R09.02] Elevated troponin I level [R77.8] AKI (acute kidney injury) ([place]) [N17.9] Pneumonia due to COVID-19 virus [U07.1, J12.82] Patient Active Problem List   Diagnosis Date Noted   Hypoxia 04/30/2021   Pneumonia due to COVID-19 virus    Elevated troponin I level    AKI (acute kidney injury) ([place])    MGUS (monoclonal gammopathy of unknown significance) 11/16/2020   Advanced nonexudative age-related macular degeneration of left eye with subfoveal involvement 06/19/2020   Left epiretinal membrane 06/19/2020   Advanced nonexudative age-related macular degeneration of right eye with subfoveal involvement 06/19/2020   Exudative age-related macular degeneration of right eye with inactive choroidal neovascularization ([place]) 06/19/2020   Chronic kidney disease (CKD), stage III (moderate) ([place]) 06/16/2019   Clostridium difficile infection  Hypertriglyceridemia 12/01/2015   EXTRINSIC ASTHMA, UNSPECIFIED 01/11/2009   SPINAL STENOSIS 05/24/2008   PROSTATE SPECIFIC ANTIGEN, ELEVATED 05/24/2008   OBESITY 08/13/2006   ANEMIA-IRON DEFICIENCY 08/13/2006   Essential hypertension 08/13/2006   ALLERGIC RHINITIS 08/13/2006   GERD 08/13/2006   HIATAL HERNIA 08/13/2006   DEGENERATIVE JOINT DISEASE 08/13/2006   PCP:  Dorothyann Peng, NP Pharmacy:   [facility] - [place], [place] - [address] [place] [address] Phone: [phone] Fax: [phone]     Social Determinants of Health (SDOH) Interventions    Readmission Risk Interventions No flowsheet data found.

## 2021-05-02 NOTE — Progress Notes (Signed)
Physical Therapy Treatment Patient Details Name: DARVIS CROFT MRN: [id number] DOB: 05-28-1932 Today's Date: 05/02/2021    History of Present Illness 87yo male admitted 04/30/21 with progressive pleuritic chest pain and cough after testing positive for Covid 04/14/21. Found to be hypoxic and with post viral PNA. PMH C-diff, HTN, asthma, gout, macular degeneration, spinal stenosis, back surgery    PT Comments    Patient received in recliner, pleasant and cooperative with therapy but does seem a bit more confused today- asked some repeated questions, had less safety awareness as compared to eval, and was more impulsive. Also needed increased cues for safety/sequencing. Able to progress gait distance well today but continues to require 2LPM O2 to maintain sats in the 90s with activity. Left up in recliner with family providing direct supervision, all other needs met. Will continue to follow.     Follow Up Recommendations  Home health PT;Supervision/Assistance - 24 hour     Equipment Recommendations  Rolling walker with 5" wheels;3in1 (PT);Other (comment) (shower seat)    Recommendations for Other Services       Precautions / Restrictions Precautions Precautions: Fall;Other (comment) Precaution Comments: watch sats, Covid + Restrictions Weight Bearing Restrictions: No    Mobility  Bed Mobility               General bed mobility comments: Pt up in recliner    Transfers Overall transfer level: Needs assistance Equipment used: Rolling walker (2 wheeled) Transfers: Sit to/from Stand Sit to Stand: Min guard         General transfer comment: min guard to boost all the way up to standing- did need Max cues for hand placement and sequencing  Ambulation/Gait Ambulation/Gait assistance: Min guard Gait Distance (Feet): 80 Feet (9fx2) Assistive device: Rolling walker (2 wheeled) Gait Pattern/deviations: Step-through pattern;Decreased step length - right;Decreased step length -  left;Trunk flexed;Drifts right/left Gait velocity: decreased   General Gait Details: slow and mildly unsteady with RW, but able to tolerate progression of gait distance today. Still needs 2LPM O2 to maintain SpO2 in the 90s with activity. Mod cues for navigation in room   Stairs             Wheelchair Mobility    Modified Rankin (Stroke Patients Only)       Balance Overall balance assessment: Needs assistance;History of Falls Sitting-balance support: Bilateral upper extremity supported;Feet supported Sitting balance-Leahy Scale: Good Sitting balance - Comments: once sitting at midline   Standing balance support: Bilateral upper extremity supported;During functional activity Standing balance-Leahy Scale: Poor Standing balance comment: needs BUE support for dynamic tasks                            Cognition Arousal/Alertness: Awake/alert Behavior During Therapy: WFL for tasks assessed/performed Overall Cognitive Status: Impaired/Different from baseline Area of Impairment: Memory;Safety/judgement;Awareness;Problem solving                     Memory: Decreased short-term memory   Safety/Judgement: Decreased awareness of safety;Decreased awareness of deficits Awareness: Intellectual Problem Solving: Slow processing;Requires verbal cues;Difficulty sequencing General Comments: seemed a bit more confused today- needed repeated simple cues for tasks and seemed a bit more impulsive as well      Exercises      General Comments        Pertinent Vitals/Pain Pain Assessment: No/denies pain    Home Living  Prior Function            PT Goals (current goals can now be found in the care plan section) Acute Rehab PT Goals Patient Stated Goal: go home when medically ready PT Goal Formulation: With patient/family Time For Goal Achievement: 05/15/21 Potential to Achieve Goals: Fair Progress towards PT goals: Progressing  toward goals    Frequency    Min 3X/week      PT Plan Current plan remains appropriate    Co-evaluation              AM-PAC PT "6 Clicks" Mobility   Outcome Measure  Help needed turning from your back to your side while in a flat bed without using bedrails?: A Little Help needed moving from lying on your back to sitting on the side of a flat bed without using bedrails?: A Lot Help needed moving to and from a bed to a chair (including a wheelchair)?: A Little Help needed standing up from a chair using your arms (e.g., wheelchair or bedside chair)?: A Little Help needed to walk in hospital room?: A Little Help needed climbing 3-5 steps with a railing? : A Little 6 Click Score: 17    End of Session Equipment Utilized During Treatment: Oxygen Activity Tolerance: Patient tolerated treatment well Patient left: in chair;with call bell/phone within reach;with family/visitor present Nurse Communication: Mobility status PT Visit Diagnosis: Unsteadiness on feet (R26.81);Muscle weakness (generalized) (M62.81);Difficulty in walking, not elsewhere classified (R26.2);History of falling (Z91.81)     Time: 6184-8592 PT Time Calculation (min) (ACUTE ONLY): 23 min  Charges:  $Gait Training: 8-22 mins $Therapeutic Activity: 8-22 mins                    Windell Norfolk, DPT, PN1   Supplemental Physical Therapist [place]    Pager [phone] Acute Rehab Office [phone]

## 2021-05-02 NOTE — Progress Notes (Addendum)
Progress Note  Patient Name: Shane [name] Date of Encounter: 05/02/2021  [facility] Cardiologist: New to Dr Percival Spanish   Subjective   Patient is sitting in chair, intermittently coughing, denied any chest pain. He states he is feeling overall poor, had some vomiting after taking a few bites of his breakfast. He denied ever having hx of heart problem.   Inpatient Medications    Scheduled Meds:  allopurinol  300 mg Oral Daily   aspirin  81 mg Oral Daily   atenolol  25 mg Oral Daily   doxycycline  100 mg Oral BID   finasteride  5 mg Oral Daily   guaiFENesin  1,200 mg Oral BID   heparin  5,000 Units Subcutaneous Q12H   Ipratropium-Albuterol  1 puff Inhalation Q6H   isosorbide mononitrate  15 mg Oral Daily   multivitamin  1 tablet Oral Daily   simvastatin  10 mg Oral QHS   sodium bicarbonate  650 mg Oral BID   Continuous Infusions:  cefTRIAXone (ROCEPHIN)  IV 1 g (05/01/21 1728)   PRN Meds: acetaminophen, albuterol, benzonatate, hydrALAZINE, [DISCONTINUED] ondansetron **OR** ondansetron (ZOFRAN) IV   Vital Signs    Vitals:   05/01/21 2010 05/02/21 0036 05/02/21 0413 05/02/21 0802  BP: (!) 118/55 105/62 133/61 (!) 147/80  Pulse: 80 65 64 77  Resp: 17 16 18 18   Temp: 98.3 F (36.8 C) 98.2 F (36.8 C) 98.3 F (36.8 C) (!) 97.5 F (36.4 C)  TempSrc: Axillary Axillary Axillary Oral  SpO2: 90% 91% 94% 92%  Weight:      Height:        Intake/Output Summary (Last 24 hours) at 05/02/2021 1009 Last data filed at 05/02/2021 0806 Gross per 24 hour  Intake --  Output 1000 ml  Net -1000 ml   Last 3 Weights 04/30/2021 04/25/2021 01/09/2021  Weight (lbs) 177 lb 196 lb 196 lb  Weight (kg) 80.287 kg 88.905 kg 88.905 kg      Telemetry    Sinus rhythm with ventricular rate of 70-80s, artifacts, first degree AV block noted  - Personally Reviewed  ECG    N/A this AM - Personally Reviewed  Physical Exam   GEN: No acute distress. Frail elderly; HOH  Neck: No JVD Cardiac:  RRR, no murmurs, rubs, or gallops.  Respiratory: Clear to auscultation bilaterally. On 2LNC pox 92%. Speaks full sentence, Coughing noted  GI: Soft, nontender, non-distended  Foley with dark concentrated urine MS: No edema; No deformity. Neuro:  Nonfocal  Psych: Flat effect, depressed   Labs    High Sensitivity Troponin:   Recent Labs  Lab 04/30/21 1111 04/30/21 1216 05/01/21 0533  TROPONINIHS 149* 133* 182*      Chemistry Recent Labs  Lab 04/27/21 1011 04/30/21 1111 05/01/21 0257 05/02/21 0116  NA 139 139 139 138  K 3.6 3.7 3.8 3.8  CL 111 110 115* 109  CO2 17* 17* 14* 16*  GLUCOSE 117* 111* 97 106*  BUN 58* 63* 65* 73*  CREATININE 2.31* 2.67* 2.45* 2.87*  CALCIUM 9.0 8.9 8.3* 8.2*  PROT 6.3* 6.3*  --  5.2*  ALBUMIN 2.9* 2.6*  --  2.2*  AST 31 45*  --  39  ALT 20 21  --  19  ALKPHOS 22* 23*  --  20*  BILITOT 0.9 1.2  --  0.7  GFRNONAA 27* 23* 25* 21*  ANIONGAP 11 12 10 13      Hematology Recent Labs  Lab 04/30/21 1111 05/01/21 0257  05/02/21 0116  WBC 5.1 5.1 4.9  RBC 4.71 3.82* 3.86*  HGB 14.2 11.9* 11.7*  HCT 44.8 36.0* 35.6*  MCV 95.1 94.2 92.2  MCH 30.1 31.2 30.3  MCHC 31.7 33.1 32.9  RDW 13.8 13.9 14.0  PLT 144* 132* 129*    BNP Recent Labs  Lab 05/01/21 0742 05/02/21 0116  BNP 59.8 68.8     DDimer  Recent Labs  Lab 05/01/21 0742  DDIMER 1.03*     Radiology    DG Chest 2 View  Result Date: 04/30/2021 CLINICAL DATA:  Shortness of breath, COVID EXAM: CHEST - 2 VIEW COMPARISON:  04/27/2021 FINDINGS: Left basilar airspace opacity with small left effusion again noted, not significantly changed. No confluent opacity on the right. Heart is borderline in size. Aortic atherosclerosis. No acute bony abnormality. IMPRESSION: Continued left basilar opacity concerning for pneumonia. Small left effusion. No real change. Electronically Signed   By: Rolm Baptise M.D.   On: 04/30/2021 11:20   CT Angio Chest PE W/Cm &/Or Wo Cm  Result Date:  04/30/2021 CLINICAL DATA:  Shortness of breath, COVID-19 positive. PE suspected EXAM: CT ANGIOGRAPHY CHEST WITH CONTRAST TECHNIQUE: Multidetector CT imaging of the chest was performed using the standard protocol during bolus administration of intravenous contrast. Multiplanar CT image reconstructions and MIPs were obtained to evaluate the vascular anatomy. CONTRAST:  12mL OMNIPAQUE IOHEXOL 350 MG/ML SOLN COMPARISON:  Same day chest x-ray FINDINGS: Cardiovascular: Satisfactory opacification of the pulmonary arteries to the segmental level. No evidence of pulmonary embolism. Thoracic aorta is nonaneurysmal. Atherosclerotic calcifications of the aorta and coronary arteries. Heart size is borderline enlarged. No pericardial effusion. Mediastinum/Nodes: No enlarged mediastinal, hilar, or axillary lymph nodes. Thyroid gland, trachea, and esophagus demonstrate no significant findings. Lungs/Pleura: Patchy airspace consolidation within the basilar segment of the left lower lobe. Subsegmental atelectasis within the dependent right lower lobe. Minimal ground-glass opacity within the bilateral upper lobes. No pleural effusion or pneumothorax. Upper Abdomen: Small hiatal hernia. Musculoskeletal: No chest wall abnormality. No acute or significant osseous findings. Multilevel bridging endplate osteophytes anteriorly within the thoracic spine suggesting diffuse idiopathic skeletal hyperostosis. Review of the MIP images confirms the above findings. IMPRESSION: 1. No evidence of pulmonary embolism. 2. Patchy airspace consolidation within the basilar segment of the left lower lobe, suspicious for pneumonia. 3. Minimal ground-glass opacity within the bilateral upper lobes, which may represent additional sites of atypical/viral infection. 4. Aortic atherosclerosis (ICD10-I70.0). 5. Skeletal findings suggestive of DISH. Electronically Signed   By: Davina Poke D.O.   On: 04/30/2021 15:53   DG Chest Port 1 View  Result Date:  05/02/2021 CLINICAL DATA:  Shortness of breath, COVID-19 infection. EXAM: PORTABLE CHEST 1 VIEW COMPARISON:  05/01/2021 and CT chest 04/30/2021. FINDINGS: Trachea is midline. Heart size stable. Thoracic aorta is calcified. Patchy bilateral airspace opacification, worst in the left lower lobe. Overall aeration has improved slightly from yesterday's exam. There may be a small left pleural effusion. Resection of the distal right clavicle. IMPRESSION: 1. Patchy bilateral airspace opacification, worst in the left lower lobe but overall improved from 05/01/2021, supporting improving COVID-19 pneumonia. 2. Small left pleural effusion. Electronically Signed   By: Lorin Picket M.D.   On: 05/02/2021 08:44   DG Chest Port 1 View  Result Date: 05/01/2021 CLINICAL DATA:  Shortness of breath.  COVID. EXAM: PORTABLE CHEST 1 VIEW COMPARISON:  CT chest 04/30/2021.  Chest x-ray 04/30/2021 FINDINGS: Mediastinum is stable. Cardiomegaly. Progressive bibasilar pulmonary infiltrates/edema. Small bilateral pleural effusions. No pneumothorax.  Degenerative change thoracic spine. IMPRESSION: 1.  Cardiomegaly. 2. Progressive bibasilar pulmonary infiltrates/edema. Small bilateral pleural effusions. Electronically Signed   By: Marcello Moores  Register   On: 05/01/2021 08:31   ECHOCARDIOGRAM COMPLETE  Result Date: 05/01/2021    ECHOCARDIOGRAM REPORT   Patient Name:   Shane [name] Date of Exam: 05/01/2021 Medical Rec #:  [id number]      Height:       69.0 in Accession #:    [id number]     Weight:       177.0 lb Date of Birth:  November 13, 1931      BSA:          1.961 m Patient Age:    87 years       BP:           118/65 mmHg Patient Gender: M              HR:           70 bpm. Exam Location:  Inpatient Procedure: 2D Echo, Cardiac Doppler and Color Doppler Indications:    I50.33 Acute on chronic diastolic (congestive) heart failure  History:        Patient has no prior history of Echocardiogram examinations.                 Risk Factors:Hypertension.  Cancer. GERD.  Sonographer:    Jonelle Sidle Dance Referring Phys: [id number] Scott  1. Left ventricular ejection fraction, by estimation, is 55 to 60%. The left ventricle has normal function. The left ventricle has no regional wall motion abnormalities. Left ventricular diastolic parameters are consistent with Grade I diastolic dysfunction (impaired relaxation).  2. Right ventricular systolic function is normal. The right ventricular size is normal. There is mildly elevated pulmonary artery systolic pressure.  3. The mitral valve is normal in structure. No evidence of mitral valve regurgitation. No evidence of mitral stenosis.  4. Tricuspid valve regurgitation is mild to moderate.  5. The aortic valve is normal in structure. Aortic valve regurgitation is mild. No aortic stenosis is present.  6. The inferior vena cava is normal in size with greater than 50% respiratory variability, suggesting right atrial pressure of 3 mmHg. FINDINGS  Left Ventricle: Left ventricular ejection fraction, by estimation, is 55 to 60%. The left ventricle has normal function. The left ventricle has no regional wall motion abnormalities. The left ventricular internal cavity size was normal in size. There is  no left ventricular hypertrophy. Left ventricular diastolic parameters are consistent with Grade I diastolic dysfunction (impaired relaxation). Normal left ventricular filling pressure. Right Ventricle: The right ventricular size is normal. No increase in right ventricular wall thickness. Right ventricular systolic function is normal. There is mildly elevated pulmonary artery systolic pressure. The tricuspid regurgitant velocity is 2.89  m/s, and with an assumed right atrial pressure of 8 mmHg, the estimated right ventricular systolic pressure is 30.8 mmHg. Left Atrium: Left atrial size was normal in size. Right Atrium: Right atrial size was normal in size. Pericardium: There is no evidence of pericardial effusion. Mitral  Valve: The mitral valve is normal in structure. No evidence of mitral valve regurgitation. No evidence of mitral valve stenosis. Tricuspid Valve: The tricuspid valve is normal in structure. Tricuspid valve regurgitation is mild to moderate. No evidence of tricuspid stenosis. Aortic Valve: The aortic valve is normal in structure. Aortic valve regurgitation is mild. Aortic regurgitation PHT measures 538 msec. No aortic stenosis is present. Pulmonic Valve: The pulmonic valve was  normal in structure. Pulmonic valve regurgitation is not visualized. No evidence of pulmonic stenosis. Aorta: The aortic root is normal in size and structure. Venous: The inferior vena cava is normal in size with greater than 50% respiratory variability, suggesting right atrial pressure of 3 mmHg. IAS/Shunts: No atrial level shunt detected by color flow Doppler.  LEFT VENTRICLE PLAX 2D LVIDd:         4.50 cm     Diastology LVIDs:         3.20 cm     LV e' medial:    5.98 cm/s LV PW:         1.30 cm     LV E/e' medial:  11.2 LV IVS:        1.50 cm     LV e' lateral:   9.46 cm/s LVOT diam:     2.40 cm     LV E/e' lateral: 7.1 LV SV:         84 LV SV Index:   43 LVOT Area:     4.52 cm  LV Volumes (MOD) LV vol d, MOD A2C: 59.8 ml LV vol d, MOD A4C: 84.4 ml LV vol s, MOD A2C: 28.8 ml LV vol s, MOD A4C: 37.8 ml LV SV MOD A2C:     31.0 ml LV SV MOD A4C:     84.4 ml LV SV MOD BP:      37.0 ml RIGHT VENTRICLE RV Basal diam:  2.90 cm RV S prime:     11.90 cm/s TAPSE (M-mode): 1.6 cm LEFT ATRIUM             Index       RIGHT ATRIUM           Index LA diam:        3.70 cm 1.89 cm/m  RA Area:     14.80 cm LA Vol (A2C):   48.3 ml 24.62 ml/m RA Volume:   34.10 ml  17.38 ml/m LA Vol (A4C):   27.0 ml 13.77 ml/m LA Biplane Vol: 37.7 ml 19.22 ml/m  AORTIC VALVE LVOT Vmax:   103.00 cm/s LVOT Vmean:  63.950 cm/s LVOT VTI:    0.185 m AI PHT:      538 msec  AORTA Ao Root diam: 3.60 cm Ao Asc diam:  3.50 cm MITRAL VALVE               TRICUSPID VALVE MV Area  (PHT): 2.66 cm    TR Peak grad:   33.4 mmHg MV Decel Time: 285 msec    TR Vmax:        289.00 cm/s MV E velocity: 66.70 cm/s MV A velocity: 79.60 cm/s  SHUNTS MV E/A ratio:  0.84        Systemic VTI:  0.18 m                            Systemic Diam: 2.40 cm Dani Gobble Croitoru MD Electronically signed by Sanda Klein MD Signature Date/Time: 05/01/2021/4:49:09 PM    Final     Cardiac Studies   Echo from 05/01/21:   1. Left ventricular ejection fraction, by estimation, is 55 to 60%. The  left ventricle has normal function. The left ventricle has no regional  wall motion abnormalities. Left ventricular diastolic parameters are  consistent with Grade I diastolic dysfunction (impaired relaxation).   2. Right ventricular systolic function is normal. The right ventricular  size  is normal. There is mildly elevated pulmonary artery systolic  pressure.   3. The mitral valve is normal in structure. No evidence of mitral valve  regurgitation. No evidence of mitral stenosis.   4. Tricuspid valve regurgitation is mild to moderate.   5. The aortic valve is normal in structure. Aortic valve regurgitation is  mild. No aortic stenosis is present.   6. The inferior vena cava is normal in size with greater than 50%  respiratory variability, suggesting right atrial pressure of 3 mmHg.   Patient Profile     87 y.o. male with PMH of MGUS, CKD IV, HTN, GERD, asthma, gout, BPH, HLD, who is currently admitted for hypoxic respiratory failure due to post viral pneumonia, after COVID-19 positive on 04/14/21. Cardiology consulted for chest pain and elevated trop since 05/01/21.   Assessment & Plan    Elevated trop  - no previous cardiac history  - Hs trop 149 >133 >182 - BNP 68.8 - EKG showed no acute ischemic changes,  anterolateral Q waves present new from 2017 - Echo 8/2 showed EF 55-60%, no RWMA, grade I DD, mild to mod TR  - CTA negative for PE on 04/30/21  - likely demand ischemic in the setting of respiratory  failure, pneumonia, AKI on CKD IV - may pursue outpatient ischemic workup when acute illness resolves and chest pain recurs - no evidence of pulmonary edema, no diuretic indicated at this time   HTN - BP fairly controlled, on atenolol, imdur 15mg , ACEI held due to AKI  HLD - on statin  Acute hypoxic respiratory failure: remains on [place] oxygen support  Post viral pneumonia: on antibiotic per IM  Hx of COVID-19 since 04/14/21   AKI on CKD stage IV: consider renal dose all meds and nephrology consult  MGUS - managed per IM   For questions or updates, please contact [facility] Please consult [email or web address] for contact info under        Signed, Margie Billet, NP  05/02/2021, 10:09 AM    History and all data above reviewed.  Patient examined.  I agree with the findings as above.  He thinks that he is breathing OK.  No pain.  His daughter reports some confusion.    The patient exam reveals COR:RRR  ,  Lungs: Few basilar crackles  ,  Abd: Positive bowel sounds, no rebound no guarding, Ext No edema  .  All available labs, radiology testing, previous records reviewed. Agree with documented assessment and plan. Elevated troponin:  Perhaps demand ischemia but I do not think that this is the primary problem.  I am not suggesting in patient cardiovascular testing given the echo results above and lack of symptoms.  We can see him back after discharge and consider a perfusion study but I don't anticipate any further in patient testing.    Shalie Schremp  12:00 PM  05/02/2021

## 2021-05-02 NOTE — Progress Notes (Signed)
PROGRESS NOTE                                                                                                                                                                                                             Patient Demographics:    Jamarl Pew, is a 87 y.o. male, DOB - 1932-02-16, WOE:[id number]  Outpatient Primary MD for the patient is Dorothyann Peng, NP    LOS - 2  Admit date - 04/30/2021    Chief Complaint  Patient presents with   Shortness of Breath       Brief Narrative (HPI from H&P)  - ERYK BEAVERS is a 87 y.o. male with medical history significant of MGUS, stage IV, HTN, came with worsening of shortness of breath and productive cough with white frothy phlegm, of note he was diagnosed with COVID-19 on July 16 but had no symptoms, he is fully vaccinated and boosted.  In the ER he was diagnosed with pneumonia, hypoxic respiratory failure and admitted to the hospital   Subjective:    Patient in bed, appears comfortable, denies any headache, no fever, no chest pain or pressure, improved shortness of breath , no abdominal pain. No new focal weakness.   Assessment  & Plan :    Acute Hypoxic Resp. Failure due to unity acquired pneumonia along with acute on chronic CHF diastolic CHF with EF 55 to 60% - he has been placed on appropriate antibiotics, he has been adequately diuresed with IV Lasix, echo noted, shortness of breath and chest x-ray much improved on 05/02/2021, continue antibiotics and monitor, will try and advance activity and titrate down oxygen over the next few days.    2.  Incidental COVID-19 infection on July 16.  Fully vaccinated and boosted never had symptoms due to COVID-19, clinically resolved.  3.  Mild elevation of troponin trend is flat and in non-ACS pattern.  No chest pain nonacute EKG. Will echocardiogram with preserved EF and no wall motion abnormality, likely mild bump in troponin due to  hypoxia and demand mismatch.  He is on statin, beta-blocker will add aspirin.  Seen by cardiology appreciate input.  4.  Dyslipidemia.  On statin.  5. CKD IV with AKI.  Baseline creatinine around 2.  Hold further diuresis and monitor closely.  6. Bilateral Hearing Loss.  Requested daughter to bring  hearing aids.  At risk for delirium, clearly explained to daughter, minimize narcotics and benzodiazepines use as needed Haldol if need.  7.  Hypertension.  Placed on beta-blocker continue.  ACE inhibitor on hold due to AKI.      Condition -  Guarded  Family Communication  :  daughter bedside 05/01/21, 05/02/2021  Code Status :  Full  Consults  :  None  PUD Prophylaxis :   Procedures  :     TTE -    1. Left ventricular ejection fraction, by estimation, is 55 to 60%. The left ventricle has normal function. The left ventricle has no regional wall motion abnormalities. Left ventricular diastolic parameters are consistent with Grade I diastolic dysfunction (impaired relaxation).   2. Right ventricular systolic function is normal. The right ventricular size is normal. There is mildly elevated pulmonary artery systolic pressure.   3. The mitral valve is normal in structure. No evidence of mitral valve regurgitation. No evidence of mitral stenosis.   4. Tricuspid valve regurgitation is mild to moderate.   5. The aortic valve is normal in structure. Aortic valve regurgitation is mild. No aortic stenosis is present.   6. The inferior vena cava is normal in size with greater than 50% respiratory variability, suggesting right atrial pressure of 3 mmHg.      Disposition Plan  :    Status is: Inpatient  Remains inpatient appropriate because:IV treatments appropriate due to intensity of illness or inability to take PO  Dispo: The patient is from: Home              Anticipated d/c is to: Home              Patient currently is not medically stable to d/c.   Difficult to place patient No   DVT  Prophylaxis  :    heparin injection 5,000 Units Start: 04/30/21 2200    Lab Results  Component Value Date   PLT 129 (L) 05/02/2021    Diet :  Diet Order             Diet Heart Room service appropriate? Yes; Fluid consistency: Thin; Fluid restriction: 2000 mL Fluid  Diet effective now                    Inpatient Medications  Scheduled Meds:  allopurinol  300 mg Oral Daily   aspirin  81 mg Oral Daily   atenolol  25 mg Oral Daily   doxycycline  100 mg Oral BID   finasteride  5 mg Oral Daily   guaiFENesin  1,200 mg Oral BID   heparin  5,000 Units Subcutaneous Q12H   Ipratropium-Albuterol  1 puff Inhalation Q6H   isosorbide mononitrate  15 mg Oral Daily   multivitamin  1 tablet Oral Daily   simvastatin  10 mg Oral QHS   sodium bicarbonate  650 mg Oral BID   Continuous Infusions:  cefTRIAXone (ROCEPHIN)  IV 1 g (05/01/21 1728)   PRN Meds:.acetaminophen, albuterol, benzonatate, hydrALAZINE, [DISCONTINUED] ondansetron **OR** ondansetron (ZOFRAN) IV  Antibiotics  :    Anti-infectives (From admission, onward)    Start     Dose/Rate Route Frequency Ordered Stop   04/30/21 2200  doxycycline (VIBRA-TABS) tablet 100 mg        100 mg Oral 2 times daily 04/30/21 1743     04/30/21 1800  cefTRIAXone (ROCEPHIN) 1 g in sodium chloride 0.9 % 100 mL IVPB  1 g 200 mL/hr over 30 Minutes Intravenous Every 24 hours 04/30/21 1743          Time Spent in minutes  30   Lala Lund M.D on 05/02/2021 at 12:12 PM  To page go to [email or web address]   [facility] -  Office  [phone]     See all Orders from today for further details    Objective:   Vitals:   05/01/21 2010 05/02/21 0036 05/02/21 0413 05/02/21 0802  BP: (!) 118/55 105/62 133/61 (!) 147/80  Pulse: 80 65 64 77  Resp: 17 16 18 18   Temp: 98.3 F (36.8 C) 98.2 F (36.8 C) 98.3 F (36.8 C) (!) 97.5 F (36.4 C)  TempSrc: Axillary Axillary Axillary Oral  SpO2: 90% 91% 94% 92%  Weight:      Height:         Wt Readings from Last 3 Encounters:  04/30/21 80.3 kg  04/25/21 88.9 kg  01/09/21 88.9 kg     Intake/Output Summary (Last 24 hours) at 05/02/2021 1212 Last data filed at 05/02/2021 0806 Gross per 24 hour  Intake --  Output 1000 ml  Net -1000 ml     Physical Exam  Awake Alert, No new F.N deficits, Normal affect [place].AT,PERRAL Supple Neck,No JVD, No cervical lymphadenopathy appriciated.  Symmetrical Chest wall movement, Good air movement bilaterally, CTAB RRR,No Gallops, Rubs or new Murmurs, No Parasternal Heave +ve B.Sounds, Abd Soft, No tenderness, No organomegaly appriciated, No rebound - guarding or rigidity. No Cyanosis, Clubbing or edema, No new Rash or bruise     Data Review:    CBC Recent Labs  Lab 04/27/21 1011 04/30/21 1111 05/01/21 0257 05/02/21 0116  WBC 5.8 5.1 5.1 4.9  HGB 14.0 14.2 11.9* 11.7*  HCT 42.4 44.8 36.0* 35.6*  PLT 131* 144* 132* 129*  MCV 93.2 95.1 94.2 92.2  MCH 30.8 30.1 31.2 30.3  MCHC 33.0 31.7 33.1 32.9  RDW 13.9 13.8 13.9 14.0  LYMPHSABS 1.1 0.9  --  0.9  MONOABS 0.8 0.8  --  0.7  EOSABS 0.0 0.0  --  0.0  BASOSABS 0.0 0.0  --  0.0    Recent Labs  Lab 04/27/21 1011 04/30/21 1111 05/01/21 0257 05/01/21 0742 05/02/21 0116  NA 139 139 139  --  138  K 3.6 3.7 3.8  --  3.8  CL 111 110 115*  --  109  CO2 17* 17* 14*  --  16*  GLUCOSE 117* 111* 97  --  106*  BUN 58* 63* 65*  --  73*  CREATININE 2.31* 2.67* 2.45*  --  2.87*  CALCIUM 9.0 8.9 8.3*  --  8.2*  AST 31 45*  --   --  39  ALT 20 21  --   --  19  ALKPHOS 22* 23*  --   --  20*  BILITOT 0.9 1.2  --   --  0.7  ALBUMIN 2.9* 2.6*  --   --  2.2*  MG  --   --   --  1.5* 2.3  CRP  --   --   --  7.7* 8.8*  DDIMER  --   --   --  1.03*  --   PROCALCITON  --   --   --  0.43 0.61  LATICACIDVEN 1.3  --   --   --   --   BNP  --   --   --  59.8 68.8    ------------------------------------------------------------------------------------------------------------------ No  results for input(s): CHOL, HDL, LDLCALC, TRIG, CHOLHDL, LDLDIRECT in the last 72 hours.  No results found for: HGBA1C ------------------------------------------------------------------------------------------------------------------ No results for input(s): TSH, T4TOTAL, T3FREE, THYROIDAB in the last 72 hours.  Invalid input(s): FREET3  Cardiac Enzymes No results for input(s): CKMB, TROPONINI, MYOGLOBIN in the last 168 hours.  Invalid input(s): CK ------------------------------------------------------------------------------------------------------------------    Component Value Date/Time   BNP 68.8 05/02/2021 0116    Micro Results No results found for this or any previous visit (from the past 240 hour(s)).  Radiology Reports DG Chest 2 View  Result Date: 04/30/2021 CLINICAL DATA:  Shortness of breath, COVID EXAM: CHEST - 2 VIEW COMPARISON:  04/27/2021 FINDINGS: Left basilar airspace opacity with small left effusion again noted, not significantly changed. No confluent opacity on the right. Heart is borderline in size. Aortic atherosclerosis. No acute bony abnormality. IMPRESSION: Continued left basilar opacity concerning for pneumonia. Small left effusion. No real change. Electronically Signed   By: Rolm Baptise M.D.   On: 04/30/2021 11:20   DG Chest 2 View  Result Date: 04/27/2021 CLINICAL DATA:  Cough EXAM: CHEST - 2 VIEW COMPARISON:  Radiograph 10/07/2017 FINDINGS: Unchanged, enlarged cardiac silhouette. There are left basilar opacities and a small left pleural effusion. No visible pneumothorax. No acute osseous abnormality. Thoracic spondylosis. IMPRESSION: Left basilar pneumonia and small left pleural effusion. Electronically Signed   By: Maurine Simmering   On: 04/27/2021 10:52   CT Angio Chest PE W/Cm &/Or Wo Cm  Result Date: 04/30/2021 CLINICAL DATA:  Shortness of breath, COVID-19 positive. PE suspected EXAM: CT ANGIOGRAPHY CHEST WITH CONTRAST TECHNIQUE: Multidetector CT imaging of  the chest was performed using the standard protocol during bolus administration of intravenous contrast. Multiplanar CT image reconstructions and MIPs were obtained to evaluate the vascular anatomy. CONTRAST:  73mL OMNIPAQUE IOHEXOL 350 MG/ML SOLN COMPARISON:  Same day chest x-ray FINDINGS: Cardiovascular: Satisfactory opacification of the pulmonary arteries to the segmental level. No evidence of pulmonary embolism. Thoracic aorta is nonaneurysmal. Atherosclerotic calcifications of the aorta and coronary arteries. Heart size is borderline enlarged. No pericardial effusion. Mediastinum/Nodes: No enlarged mediastinal, hilar, or axillary lymph nodes. Thyroid gland, trachea, and esophagus demonstrate no significant findings. Lungs/Pleura: Patchy airspace consolidation within the basilar segment of the left lower lobe. Subsegmental atelectasis within the dependent right lower lobe. Minimal ground-glass opacity within the bilateral upper lobes. No pleural effusion or pneumothorax. Upper Abdomen: Small hiatal hernia. Musculoskeletal: No chest wall abnormality. No acute or significant osseous findings. Multilevel bridging endplate osteophytes anteriorly within the thoracic spine suggesting diffuse idiopathic skeletal hyperostosis. Review of the MIP images confirms the above findings. IMPRESSION: 1. No evidence of pulmonary embolism. 2. Patchy airspace consolidation within the basilar segment of the left lower lobe, suspicious for pneumonia. 3. Minimal ground-glass opacity within the bilateral upper lobes, which may represent additional sites of atypical/viral infection. 4. Aortic atherosclerosis (ICD10-I70.0). 5. Skeletal findings suggestive of DISH. Electronically Signed   By: Davina Poke D.O.   On: 04/30/2021 15:53   DG Chest Port 1 View  Result Date: 05/02/2021 CLINICAL DATA:  Shortness of breath, COVID-19 infection. EXAM: PORTABLE CHEST 1 VIEW COMPARISON:  05/01/2021 and CT chest 04/30/2021. FINDINGS: Trachea is  midline. Heart size stable. Thoracic aorta is calcified. Patchy bilateral airspace opacification, worst in the left lower lobe. Overall aeration has improved slightly from yesterday's exam. There may be a small left pleural effusion. Resection of the distal right clavicle. IMPRESSION: 1. Patchy bilateral airspace opacification, worst in the left lower lobe  but overall improved from 05/01/2021, supporting improving COVID-19 pneumonia. 2. Small left pleural effusion. Electronically Signed   By: Lorin Picket M.D.   On: 05/02/2021 08:44   DG Chest Port 1 View  Result Date: 05/01/2021 CLINICAL DATA:  Shortness of breath.  COVID. EXAM: PORTABLE CHEST 1 VIEW COMPARISON:  CT chest 04/30/2021.  Chest x-ray 04/30/2021 FINDINGS: Mediastinum is stable. Cardiomegaly. Progressive bibasilar pulmonary infiltrates/edema. Small bilateral pleural effusions. No pneumothorax. Degenerative change thoracic spine. IMPRESSION: 1.  Cardiomegaly. 2. Progressive bibasilar pulmonary infiltrates/edema. Small bilateral pleural effusions. Electronically Signed   By: Marcello Moores  Register   On: 05/01/2021 08:31   ECHOCARDIOGRAM COMPLETE  Result Date: 05/01/2021    ECHOCARDIOGRAM REPORT   Patient Name:   OMARIUS GRANTHAM Date of Exam: 05/01/2021 Medical Rec #:  [id number]      Height:       69.0 in Accession #:    [id number]     Weight:       177.0 lb Date of Birth:  08-25-1932      BSA:          1.961 m Patient Age:    27 years       BP:           118/65 mmHg Patient Gender: M              HR:           70 bpm. Exam Location:  Inpatient Procedure: 2D Echo, Cardiac Doppler and Color Doppler Indications:    I50.33 Acute on chronic diastolic (congestive) heart failure  History:        Patient has no prior history of Echocardiogram examinations.                 Risk Factors:Hypertension. Cancer. GERD.  Sonographer:    Jonelle Sidle Dance Referring Phys: [id number] [place]  1. Left ventricular ejection fraction, by estimation, is 55 to 60%. The  left ventricle has normal function. The left ventricle has no regional wall motion abnormalities. Left ventricular diastolic parameters are consistent with Grade I diastolic dysfunction (impaired relaxation).  2. Right ventricular systolic function is normal. The right ventricular size is normal. There is mildly elevated pulmonary artery systolic pressure.  3. The mitral valve is normal in structure. No evidence of mitral valve regurgitation. No evidence of mitral stenosis.  4. Tricuspid valve regurgitation is mild to moderate.  5. The aortic valve is normal in structure. Aortic valve regurgitation is mild. No aortic stenosis is present.  6. The inferior vena cava is normal in size with greater than 50% respiratory variability, suggesting right atrial pressure of 3 mmHg. FINDINGS  Left Ventricle: Left ventricular ejection fraction, by estimation, is 55 to 60%. The left ventricle has normal function. The left ventricle has no regional wall motion abnormalities. The left ventricular internal cavity size was normal in size. There is  no left ventricular hypertrophy. Left ventricular diastolic parameters are consistent with Grade I diastolic dysfunction (impaired relaxation). Normal left ventricular filling pressure. Right Ventricle: The right ventricular size is normal. No increase in right ventricular wall thickness. Right ventricular systolic function is normal. There is mildly elevated pulmonary artery systolic pressure. The tricuspid regurgitant velocity is 2.89  m/s, and with an assumed right atrial pressure of 8 mmHg, the estimated right ventricular systolic pressure is 19.4 mmHg. Left Atrium: Left atrial size was normal in size. Right Atrium: Right atrial size was normal in size. Pericardium: There is no evidence of  pericardial effusion. Mitral Valve: The mitral valve is normal in structure. No evidence of mitral valve regurgitation. No evidence of mitral valve stenosis. Tricuspid Valve: The tricuspid valve is  normal in structure. Tricuspid valve regurgitation is mild to moderate. No evidence of tricuspid stenosis. Aortic Valve: The aortic valve is normal in structure. Aortic valve regurgitation is mild. Aortic regurgitation PHT measures 538 msec. No aortic stenosis is present. Pulmonic Valve: The pulmonic valve was normal in structure. Pulmonic valve regurgitation is not visualized. No evidence of pulmonic stenosis. Aorta: The aortic root is normal in size and structure. Venous: The inferior vena cava is normal in size with greater than 50% respiratory variability, suggesting right atrial pressure of 3 mmHg. IAS/Shunts: No atrial level shunt detected by color flow Doppler.  LEFT VENTRICLE PLAX 2D LVIDd:         4.50 cm     Diastology LVIDs:         3.20 cm     LV e' medial:    5.98 cm/s LV PW:         1.30 cm     LV E/e' medial:  11.2 LV IVS:        1.50 cm     LV e' lateral:   9.46 cm/s LVOT diam:     2.40 cm     LV E/e' lateral: 7.1 LV SV:         84 LV SV Index:   43 LVOT Area:     4.52 cm  LV Volumes (MOD) LV vol d, MOD A2C: 59.8 ml LV vol d, MOD A4C: 84.4 ml LV vol s, MOD A2C: 28.8 ml LV vol s, MOD A4C: 37.8 ml LV SV MOD A2C:     31.0 ml LV SV MOD A4C:     84.4 ml LV SV MOD BP:      37.0 ml RIGHT VENTRICLE RV Basal diam:  2.90 cm RV S prime:     11.90 cm/s TAPSE (M-mode): 1.6 cm LEFT ATRIUM             Index       RIGHT ATRIUM           Index LA diam:        3.70 cm 1.89 cm/m  RA Area:     14.80 cm LA Vol (A2C):   48.3 ml 24.62 ml/m RA Volume:   34.10 ml  17.38 ml/m LA Vol (A4C):   27.0 ml 13.77 ml/m LA Biplane Vol: 37.7 ml 19.22 ml/m  AORTIC VALVE LVOT Vmax:   103.00 cm/s LVOT Vmean:  63.950 cm/s LVOT VTI:    0.185 m AI PHT:      538 msec  AORTA Ao Root diam: 3.60 cm Ao Asc diam:  3.50 cm MITRAL VALVE               TRICUSPID VALVE MV Area (PHT): 2.66 cm    TR Peak grad:   33.4 mmHg MV Decel Time: 285 msec    TR Vmax:        289.00 cm/s MV E velocity: 66.70 cm/s MV A velocity: 79.60 cm/s  SHUNTS MV E/A  ratio:  0.84        Systemic VTI:  0.18 m                            Systemic Diam: 2.40 cm Dani Gobble Croitoru MD Electronically signed by Sanda Klein MD Signature  Date/Time: 05/01/2021/4:49:09 PM    Final

## 2021-05-02 NOTE — Telephone Encounter (Signed)
FYI

## 2021-05-02 NOTE — Progress Notes (Signed)
SATURATION QUALIFICATIONS: (This note is used to comply with regulatory documentation for home oxygen)  Patient Saturations on Room Air at Rest = 92%  Patient Saturations on Room Air while Ambulating  = 87%  Please briefly explain why patient needs home oxygen:

## 2021-05-03 ENCOUNTER — Telehealth: Payer: Medicare HMO

## 2021-05-03 DIAGNOSIS — U071 COVID-19: Secondary | ICD-10-CM | POA: Diagnosis not present

## 2021-05-03 LAB — COMPREHENSIVE METABOLIC PANEL
ALT: 22 U/L (ref 0–44)
AST: 36 U/L (ref 15–41)
Albumin: 2.1 g/dL — ABNORMAL LOW (ref 3.5–5.0)
Alkaline Phosphatase: 22 U/L — ABNORMAL LOW (ref 38–126)
Anion gap: 11 (ref 5–15)
BUN: 71 mg/dL — ABNORMAL HIGH (ref 8–23)
CO2: 18 mmol/L — ABNORMAL LOW (ref 22–32)
Calcium: 8.5 mg/dL — ABNORMAL LOW (ref 8.9–10.3)
Chloride: 109 mmol/L (ref 98–111)
Creatinine, Ser: 2.72 mg/dL — ABNORMAL HIGH (ref 0.61–1.24)
GFR, Estimated: 22 mL/min — ABNORMAL LOW (ref >60)
Glucose, Bld: 97 mg/dL (ref 70–99)
Potassium: 3.7 mmol/L (ref 3.5–5.1)
Sodium: 138 mmol/L (ref 135–145)
Total Bilirubin: 0.7 mg/dL (ref 0.3–1.2)
Total Protein: 5.2 g/dL — ABNORMAL LOW (ref 6.5–8.1)

## 2021-05-03 LAB — CBC WITH DIFFERENTIAL/PLATELET
Abs Immature Granulocytes: 0.02 10*3/uL (ref 0.00–0.07)
Basophils Absolute: 0 10*3/uL (ref 0.0–0.1)
Basophils Relative: 0 %
Eosinophils Absolute: 0.1 10*3/uL (ref 0.0–0.5)
Eosinophils Relative: 1 %
HCT: 35.6 % — ABNORMAL LOW (ref 39.0–52.0)
Hemoglobin: 11.7 g/dL — ABNORMAL LOW (ref 13.0–17.0)
Immature Granulocytes: 1 %
Lymphocytes Relative: 24 %
Lymphs Abs: 1 10*3/uL (ref 0.7–4.0)
MCH: 30.2 pg (ref 26.0–34.0)
MCHC: 32.9 g/dL (ref 30.0–36.0)
MCV: 92 fL (ref 80.0–100.0)
Monocytes Absolute: 0.6 10*3/uL (ref 0.1–1.0)
Monocytes Relative: 16 %
Neutro Abs: 2.3 10*3/uL (ref 1.7–7.7)
Neutrophils Relative %: 58 %
Platelets: 128 10*3/uL — ABNORMAL LOW (ref 150–400)
RBC: 3.87 MIL/uL — ABNORMAL LOW (ref 4.22–5.81)
RDW: 13.9 % (ref 11.5–15.5)
WBC: 4 10*3/uL (ref 4.0–10.5)
nRBC: 0 % (ref 0.0–0.2)

## 2021-05-03 LAB — PROCALCITONIN: Procalcitonin: 0.67 ng/mL

## 2021-05-03 LAB — C-REACTIVE PROTEIN: CRP: 9.2 mg/dL — ABNORMAL HIGH (ref <1.0)

## 2021-05-03 LAB — BRAIN NATRIURETIC PEPTIDE: B Natriuretic Peptide: 224.1 pg/mL — ABNORMAL HIGH (ref 0.0–100.0)

## 2021-05-03 LAB — MAGNESIUM: Magnesium: 2.1 mg/dL (ref 1.7–2.4)

## 2021-05-03 MED ORDER — AMLODIPINE BESYLATE 5 MG PO TABS
5.0000 mg | ORAL_TABLET | Freq: Every day | ORAL | Status: DC
  Administered 2021-05-03: 5 mg via ORAL
  Filled 2021-05-03: qty 1

## 2021-05-03 MED ORDER — DOXYCYCLINE HYCLATE 100 MG PO CAPS: 100.0000 mg | ORAL_CAPSULE | Freq: Two times a day (BID) | ORAL | 0 refills | Status: AC

## 2021-05-03 MED ORDER — ASPIRIN 81 MG PO CHEW: 81.0000 mg | CHEWABLE_TABLET | Freq: Every day | ORAL | 0 refills | Status: DC

## 2021-05-03 MED ORDER — POTASSIUM CHLORIDE ER 10 MEQ PO TBCR: 10.0000 meq | EXTENDED_RELEASE_TABLET | Freq: Every day | ORAL | 0 refills | Status: DC | PRN

## 2021-05-03 MED ORDER — ISOSORBIDE MONONITRATE ER 30 MG PO TB24: 15.0000 mg | ORAL_TABLET | Freq: Every day | ORAL | 0 refills | Status: DC

## 2021-05-03 MED ORDER — FUROSEMIDE 40 MG PO TABS: 20.0000 mg | ORAL_TABLET | Freq: Every day | ORAL | 0 refills | Status: DC | PRN

## 2021-05-03 MED ORDER — CEPHALEXIN 500 MG PO CAPS: 500.0000 mg | ORAL_CAPSULE | Freq: Three times a day (TID) | ORAL | 0 refills | Status: AC

## 2021-05-03 MED ORDER — AMLODIPINE BESYLATE 5 MG PO TABS: 5.0000 mg | ORAL_TABLET | Freq: Every day | ORAL | 0 refills | Status: DC

## 2021-05-03 NOTE — Discharge Instructions (Signed)
Follow with Primary MD Dorothyann Peng, NP in 7 days   Get CBC, CMP, 2 view Chest X ray -  checked next visit within 1 week by Primary MD    Activity: As tolerated with Full fall precautions use walker/cane & assistance as needed  Disposition Home    Diet: Heart Healthy with 1.5 lit/day fluid restriction  Check your Weight same time everyday, if you gain over 2 pounds, or you develop in leg swelling, experience more shortness of breath or chest pain, call your Primary MD immediately. Follow Cardiac Low Salt Diet and 1.5 lit/day fluid restriction.  Special Instructions: If you have smoked or chewed Tobacco  in the last 2 yrs please stop smoking, stop any regular Alcohol  and or any Recreational drug use.  On your next visit with your primary care physician please Get Medicines reviewed and adjusted.  Please request your Prim.MD to go over all Hospital Tests and Procedure/Radiological results at the follow up, please get all Hospital records sent to your Prim MD by signing hospital release before you go home.  If you experience worsening of your admission symptoms, develop shortness of breath, life threatening emergency, suicidal or homicidal thoughts you must seek medical attention immediately by calling 911 or calling your MD immediately  if symptoms less severe.  You Must read complete instructions/literature along with all the possible adverse reactions/side effects for all the Medicines you take and that have been prescribed to you. Take any new Medicines after you have completely understood and accpet all the possible adverse reactions/side effects.

## 2021-05-03 NOTE — Progress Notes (Signed)
SATURATION QUALIFICATIONS: (This note is used to comply with regulatory documentation for home oxygen)  Patient Saturations on Room Air at Rest = 97%  Patient Saturations on Room Air while Ambulating = 89%  Patient Saturations on 2 Liters of oxygen while Ambulating = 95%  Please briefly explain why patient needs home oxygen: Pt desat to 89% with some shortness of breath while ambulating on room air.

## 2021-05-03 NOTE — Progress Notes (Signed)
Pt ambulated in room x 3 mins & O2 sat went down to 85% on RA, noted to have deep respirations. O2 sats 95% while sitting after ambulation.

## 2021-05-03 NOTE — Care Management (Signed)
Left Levada Dy with [facility] a message, regarding discharge today.   Secure chatted MD and Nurse regarding home oxygen.   Yesterday ambulation oxygen saturation note is incomplete, will need ambulation saturation on oxygen documented , also liter flow of oxygen while ambulating.

## 2021-05-03 NOTE — Discharge Summary (Addendum)
Shane [name] JSE:[id number] DOB: 05/09/1932 DOA: 04/30/2021  PCP: Dorothyann Peng, NP  Admit date: 04/30/2021  Discharge date: 05/03/2021  Admitted From: Home  Disposition:  Home   Recommendations for Outpatient Follow-up:   Follow up with PCP in 1-2 weeks  PCP Please obtain BMP/CBC, 2 view CXR in 1week,  (see Discharge instructions)   PCP Please follow up on the following pending results: Follow with PCP for recheck of CBC, CMP, magnesium level and a two-view chest x-ray in 7 to 10 days.  Also follow-up with your cardiologist within a week.   Home Health: PT, RN Equipment/Devices: as below  Consultations: Cards Discharge Condition: Stable    CODE STATUS: Full   Diet Recommendation: Heart Healthy with 1.5 L/day total fluid restriction.   Chief Complaint  Patient presents with   Shortness of Breath     Brief history of present illness from the day of admission and additional interim summary    Shane [name] is a 87 y.o. male with medical history significant of MGUS, stage IV, HTN, came with worsening of shortness of breath and productive cough with white frothy phlegm, of note he was diagnosed with COVID-19 on July 16 but had no symptoms, he is fully vaccinated and boosted.  In the ER he was diagnosed with pneumonia, hypoxic respiratory failure and admitted to the hospital                                                                 Hospital Course     Acute Hypoxic Resp. Failure due to unity acquired pneumonia along with acute on chronic CHF diastolic CHF with EF 55 to 60% - he has been placed on appropriate antibiotics, he has been adequately diuresed with IV Lasix, echo noted, shortness of breath and chest x-ray much improved and at rest he is on room air, upon ambulation he does qualify for 2 L nasal  cannula oxygen which she will get, will be discharged home on 3 more days of oral antibiotics, currently no fluid overload but request PCP to monitor his fluid status closely and dose diuretics if needed, he will benefit from outpatient cardiology follow-up in 7 to 10 days.  As needed Lasix and potassium given as well.   2.  Incidental COVID-19 infection on July 16.  Fully vaccinated and boosted never had symptoms due to COVID-19, clinically resolved.   3.  Mild elevation of troponin trend is flat and in non-ACS pattern.  No chest pain nonacute EKG. Will echocardiogram with preserved EF and no wall motion abnormality, likely mild bump in troponin due to hypoxia and demand mismatch.  He is on statin, aspirin, beta-blocker continue for secondary prevention.  Seen by cardiology appreciate input.  We will request PCP to arrange for one-time outpatient cardiology follow-up  with [facility] cardiology and 7 to 10 days post discharge.   4.  Dyslipidemia.  On statin.   5. CKD IV with AKI.  Baseline creatinine around 2, GI has improved, holding further diuretics and ACE inhibitor.  PCP to repeat CMP in 7 to 10 days.  Trend has improved good urine output.  No fluid overload.   6. Bilateral Hearing Loss.  Continue supportive care  7.  Hypertension.  Placed on beta-blocker and low-dose Norvasc continue.  ACE inhibitor on hold due to AKI.   Discharge diagnosis     Active Problems:   Hypoxia    Discharge instructions    Discharge Instructions     Discharge instructions   Complete by: As directed    Follow with Primary MD Dorothyann Peng, NP in 7 days   Get CBC, CMP, 2 view Chest X ray -  checked next visit within 1 week by Primary MD    Activity: As tolerated with Full fall precautions use walker/cane & assistance as needed  Disposition Home    Diet: Heart Healthy with 1.5 lit/day fluid restriction  Check your Weight same time everyday, if you gain over 2 pounds, or you develop in leg swelling,  experience more shortness of breath or chest pain, call your Primary MD immediately. Follow Cardiac Low Salt Diet and 1.5 lit/day fluid restriction.  Special Instructions: If you have smoked or chewed Tobacco  in the last 2 yrs please stop smoking, stop any regular Alcohol  and or any Recreational drug use.  On your next visit with your primary care physician please Get Medicines reviewed and adjusted.  Please request your Prim.MD to go over all Hospital Tests and Procedure/Radiological results at the follow up, please get all Hospital records sent to your Prim MD by signing hospital release before you go home.  If you experience worsening of your admission symptoms, develop shortness of breath, life threatening emergency, suicidal or homicidal thoughts you must seek medical attention immediately by calling 911 or calling your MD immediately  if symptoms less severe.  You Must read complete instructions/literature along with all the possible adverse reactions/side effects for all the Medicines you take and that have been prescribed to you. Take any new Medicines after you have completely understood and accpet all the possible adverse reactions/side effects.   Increase activity slowly   Complete by: As directed        Discharge Medications   Allergies as of 05/03/2021   No Known Allergies      Medication List     STOP taking these medications    lisinopril 5 MG tablet Commonly known as: ZESTRIL       TAKE these medications    acetaminophen 500 MG tablet Commonly known as: TYLENOL Take 500-1,000 mg by mouth every 6 (six) hours as needed (for pain).   albuterol 108 (90 Base) MCG/ACT inhaler Commonly known as: VENTOLIN HFA Inhale 2 puffs into the lungs every 6 (six) hours as needed for wheezing or shortness of breath.   allopurinol 300 MG tablet Commonly known as: ZYLOPRIM Take 1 tablet (300 mg total) by mouth daily.   amLODipine 5 MG tablet Commonly known as:  NORVASC Take 1 tablet (5 mg total) by mouth daily.   aspirin 81 MG chewable tablet Chew 1 tablet (81 mg total) by mouth daily. Start taking on: May 04, 2021   atenolol 25 MG tablet Commonly known as: TENORMIN Take 1 tablet (25 mg total) by mouth daily.  benzonatate 100 MG capsule Commonly known as: Tessalon Perles Take 1 capsule (100 mg total) by mouth 3 (three) times daily as needed for cough.   budesonide 3 MG 24 hr capsule Commonly known as: ENTOCORT EC Take 3 capsules (9 mg total) by mouth daily.   cephALEXin 500 MG capsule Commonly known as: KEFLEX Take 1 capsule (500 mg total) by mouth 3 (three) times daily for 3 days.   chlorthalidone 25 MG tablet Commonly known as: HYGROTON Take 0.5 tablets (12.5 mg total) by mouth daily.   doxycycline 100 MG capsule Commonly known as: VIBRAMYCIN Take 1 capsule (100 mg total) by mouth 2 (two) times daily for 3 days.   finasteride 5 MG tablet Commonly known as: Proscar Take 1 tablet (5 mg total) by mouth daily.   furosemide 40 MG tablet Commonly known as: Lasix Take 0.5 tablets (20 mg total) by mouth daily as needed for fluid or edema (from baseline26from baseline).   isosorbide mononitrate 30 MG 24 hr tablet Commonly known as: IMDUR Take 0.5 tablets (15 mg total) by mouth daily. Start taking on: May 04, 2021   potassium chloride 10 MEQ tablet Commonly known as: KLOR-CON Take 1 tablet (10 mEq total) by mouth daily as needed (Take only if you are taking Lasix that day).   PreserVision AREDS 2 Caps Take 1 capsule by mouth 2 (two) times daily.   simvastatin 10 MG tablet Commonly known as: ZOCOR Take 1 tablet (10 mg total) by mouth at bedtime.               Durable Medical Equipment  (From admission, onward)           Start     Ordered   05/03/21 1052  For home use only DME oxygen  Once       Question Answer Comment  Length of Need 6 Months   Mode or (Route) Nasal cannula   Liters per Minute 2    Frequency Continuous (stationary and portable oxygen unit needed)   Oxygen conserving device Yes   Oxygen delivery system Gas      05/03/21 1052             Follow-up Information     [place] [place] Follow up.   Specialty: Home Health Services Why: for home health services. they wioll call you in the next 1-2 days to set up a time to come out to your house to see you Contact information: [place] [address] [phone]         Dorothyann Peng, NP. Schedule an appointment as soon as possible for a visit in 1 week(s).   Specialty: Family Medicine Contact information: [place] [address] [phone]                 Major procedures and Radiology Reports - PLEASE review detailed and final reports thoroughly  -       DG Chest 2 View  Result Date: 04/30/2021 CLINICAL DATA:  Shortness of breath, COVID EXAM: CHEST - 2 VIEW COMPARISON:  04/27/2021 FINDINGS: Left basilar airspace opacity with small left effusion again noted, not significantly changed. No confluent opacity on the right. Heart is borderline in size. Aortic atherosclerosis. No acute bony abnormality. IMPRESSION: Continued left basilar opacity concerning for pneumonia. Small left effusion. No real change. Electronically Signed   By: Rolm Baptise M.D.   On: 04/30/2021 11:20   DG Chest 2 View  Result Date: 04/27/2021 CLINICAL DATA:  Cough EXAM: CHEST - 2 VIEW COMPARISON:  Radiograph 10/07/2017 FINDINGS: Unchanged, enlarged cardiac silhouette. There are left basilar opacities and a small left pleural effusion. No visible pneumothorax. No acute osseous abnormality. Thoracic spondylosis. IMPRESSION: Left basilar pneumonia and small left pleural effusion. Electronically Signed   By: Maurine Simmering   On: 04/27/2021 10:52   CT Angio Chest PE W/Cm &/Or Wo Cm  Result Date: 04/30/2021 CLINICAL DATA:  Shortness of breath, COVID-19 positive. PE suspected  EXAM: CT ANGIOGRAPHY CHEST WITH CONTRAST TECHNIQUE: Multidetector CT imaging of the chest was performed using the standard protocol during bolus administration of intravenous contrast. Multiplanar CT image reconstructions and MIPs were obtained to evaluate the vascular anatomy. CONTRAST:  26mL OMNIPAQUE IOHEXOL 350 MG/ML SOLN COMPARISON:  Same day chest x-ray FINDINGS: Cardiovascular: Satisfactory opacification of the pulmonary arteries to the segmental level. No evidence of pulmonary embolism. Thoracic aorta is nonaneurysmal. Atherosclerotic calcifications of the aorta and coronary arteries. Heart size is borderline enlarged. No pericardial effusion. Mediastinum/Nodes: No enlarged mediastinal, hilar, or axillary lymph nodes. Thyroid gland, trachea, and esophagus demonstrate no significant findings. Lungs/Pleura: Patchy airspace consolidation within the basilar segment of the left lower lobe. Subsegmental atelectasis within the dependent right lower lobe. Minimal ground-glass opacity within the bilateral upper lobes. No pleural effusion or pneumothorax. Upper Abdomen: Small hiatal hernia. Musculoskeletal: No chest wall abnormality. No acute or significant osseous findings. Multilevel bridging endplate osteophytes anteriorly within the thoracic spine suggesting diffuse idiopathic skeletal hyperostosis. Review of the MIP images confirms the above findings. IMPRESSION: 1. No evidence of pulmonary embolism. 2. Patchy airspace consolidation within the basilar segment of the left lower lobe, suspicious for pneumonia. 3. Minimal ground-glass opacity within the bilateral upper lobes, which may represent additional sites of atypical/viral infection. 4. Aortic atherosclerosis (ICD10-I70.0). 5. Skeletal findings suggestive of DISH. Electronically Signed   By: Davina Poke D.O.   On: 04/30/2021 15:53   DG Chest Port 1 View  Result Date: 05/02/2021 CLINICAL DATA:  Shortness of breath, COVID-19 infection. EXAM: PORTABLE  CHEST 1 VIEW COMPARISON:  05/01/2021 and CT chest 04/30/2021. FINDINGS: Trachea is midline. Heart size stable. Thoracic aorta is calcified. Patchy bilateral airspace opacification, worst in the left lower lobe. Overall aeration has improved slightly from yesterday's exam. There may be a small left pleural effusion. Resection of the distal right clavicle. IMPRESSION: 1. Patchy bilateral airspace opacification, worst in the left lower lobe but overall improved from 05/01/2021, supporting improving COVID-19 pneumonia. 2. Small left pleural effusion. Electronically Signed   By: Lorin Picket M.D.   On: 05/02/2021 08:44   DG Chest Port 1 View  Result Date: 05/01/2021 CLINICAL DATA:  Shortness of breath.  COVID. EXAM: PORTABLE CHEST 1 VIEW COMPARISON:  CT chest 04/30/2021.  Chest x-ray 04/30/2021 FINDINGS: Mediastinum is stable. Cardiomegaly. Progressive bibasilar pulmonary infiltrates/edema. Small bilateral pleural effusions. No pneumothorax. Degenerative change thoracic spine. IMPRESSION: 1.  Cardiomegaly. 2. Progressive bibasilar pulmonary infiltrates/edema. Small bilateral pleural effusions. Electronically Signed   By: Marcello Moores  Register   On: 05/01/2021 08:31   ECHOCARDIOGRAM COMPLETE  Result Date: 05/01/2021    ECHOCARDIOGRAM REPORT   Patient Name:   Shane [name] Date of Exam: 05/01/2021 Medical Rec #:  [id number]      Height:       69.0 in Accession #:    [id number]     Weight:       177.0 lb Date of Birth:  [birth year]      BSA:  1.961 m Patient Age:    23 years       BP:           118/65 mmHg Patient Gender: M              HR:           70 bpm. Exam Location:  Inpatient Procedure: 2D Echo, Cardiac Doppler and Color Doppler Indications:    I50.33 Acute on chronic diastolic (congestive) heart failure  History:        Patient has no prior history of Echocardiogram examinations.                 Risk Factors:Hypertension. Cancer. GERD.  Sonographer:    Jonelle Sidle Dance Referring Phys: [id number] [place]  1. Left ventricular ejection fraction, by estimation, is 55 to 60%. The left ventricle has normal function. The left ventricle has no regional wall motion abnormalities. Left ventricular diastolic parameters are consistent with Grade I diastolic dysfunction (impaired relaxation).  2. Right ventricular systolic function is normal. The right ventricular size is normal. There is mildly elevated pulmonary artery systolic pressure.  3. The mitral valve is normal in structure. No evidence of mitral valve regurgitation. No evidence of mitral stenosis.  4. Tricuspid valve regurgitation is mild to moderate.  5. The aortic valve is normal in structure. Aortic valve regurgitation is mild. No aortic stenosis is present.  6. The inferior vena cava is normal in size with greater than 50% respiratory variability, suggesting right atrial pressure of 3 mmHg. FINDINGS  Left Ventricle: Left ventricular ejection fraction, by estimation, is 55 to 60%. The left ventricle has normal function. The left ventricle has no regional wall motion abnormalities. The left ventricular internal cavity size was normal in size. There is  no left ventricular hypertrophy. Left ventricular diastolic parameters are consistent with Grade I diastolic dysfunction (impaired relaxation). Normal left ventricular filling pressure. Right Ventricle: The right ventricular size is normal. No increase in right ventricular wall thickness. Right ventricular systolic function is normal. There is mildly elevated pulmonary artery systolic pressure. The tricuspid regurgitant velocity is 2.89  m/s, and with an assumed right atrial pressure of 8 mmHg, the estimated right ventricular systolic pressure is 12.4 mmHg. Left Atrium: Left atrial size was normal in size. Right Atrium: Right atrial size was normal in size. Pericardium: There is no evidence of pericardial effusion. Mitral Valve: The mitral valve is normal in structure. No evidence of mitral valve  regurgitation. No evidence of mitral valve stenosis. Tricuspid Valve: The tricuspid valve is normal in structure. Tricuspid valve regurgitation is mild to moderate. No evidence of tricuspid stenosis. Aortic Valve: The aortic valve is normal in structure. Aortic valve regurgitation is mild. Aortic regurgitation PHT measures 538 msec. No aortic stenosis is present. Pulmonic Valve: The pulmonic valve was normal in structure. Pulmonic valve regurgitation is not visualized. No evidence of pulmonic stenosis. Aorta: The aortic root is normal in size and structure. Venous: The inferior vena cava is normal in size with greater than 50% respiratory variability, suggesting right atrial pressure of 3 mmHg. IAS/Shunts: No atrial level shunt detected by color flow Doppler.  LEFT VENTRICLE PLAX 2D LVIDd:         4.50 cm     Diastology LVIDs:         3.20 cm     LV e' medial:    5.98 cm/s LV PW:         1.30 cm  LV E/e' medial:  11.2 LV IVS:        1.50 cm     LV e' lateral:   9.46 cm/s LVOT diam:     2.40 cm     LV E/e' lateral: 7.1 LV SV:         84 LV SV Index:   43 LVOT Area:     4.52 cm  LV Volumes (MOD) LV vol d, MOD A2C: 59.8 ml LV vol d, MOD A4C: 84.4 ml LV vol s, MOD A2C: 28.8 ml LV vol s, MOD A4C: 37.8 ml LV SV MOD A2C:     31.0 ml LV SV MOD A4C:     84.4 ml LV SV MOD BP:      37.0 ml RIGHT VENTRICLE RV Basal diam:  2.90 cm RV S prime:     11.90 cm/s TAPSE (M-mode): 1.6 cm LEFT ATRIUM             Index       RIGHT ATRIUM           Index LA diam:        3.70 cm 1.89 cm/m  RA Area:     14.80 cm LA Vol (A2C):   48.3 ml 24.62 ml/m RA Volume:   34.10 ml  17.38 ml/m LA Vol (A4C):   27.0 ml 13.77 ml/m LA Biplane Vol: 37.7 ml 19.22 ml/m  AORTIC VALVE LVOT Vmax:   103.00 cm/s LVOT Vmean:  63.950 cm/s LVOT VTI:    0.185 m AI PHT:      538 msec  AORTA Ao Root diam: 3.60 cm Ao Asc diam:  3.50 cm MITRAL VALVE               TRICUSPID VALVE MV Area (PHT): 2.66 cm    TR Peak grad:   33.4 mmHg MV Decel Time: 285 msec    TR  Vmax:        289.00 cm/s MV E velocity: 66.70 cm/s MV A velocity: 79.60 cm/s  SHUNTS MV E/A ratio:  0.84        Systemic VTI:  0.18 m                            Systemic Diam: 2.40 cm Dani Gobble Croitoru MD Electronically signed by Sanda Klein MD Signature Date/Time: 05/01/2021/4:49:09 PM    Final     Micro Results    Recent Results (from the past 240 hour(s))  SARS CORONAVIRUS 2 (TAT 6-24 HRS) Nasopharyngeal Nasopharyngeal Swab     Status: Abnormal   Collection Time: 05/02/21  8:50 AM   Specimen: Nasopharyngeal Swab  Result Value Ref Range Status   SARS Coronavirus 2 POSITIVE (A) NEGATIVE Final    Comment: (NOTE) SARS-CoV-2 target nucleic acids are DETECTED.  The SARS-CoV-2 RNA is generally detectable in upper and lower respiratory specimens during the acute phase of infection. Positive results are indicative of the presence of SARS-CoV-2 RNA. Clinical correlation with patient history and other diagnostic information is  necessary to determine patient infection status. Positive results do not rule out bacterial infection or co-infection with other viruses.  The expected result is Negative.  Fact Sheet for Patients: SugarRoll.be  Fact Sheet for Healthcare Providers: https://www.woods-mathews.com/  This test is not yet approved or cleared by the [place] FDA and  has been authorized for detection and/or diagnosis of SARS-CoV-2 by FDA under an Emergency Use Authorization (EUA). This EUA will  remain  in effect (meaning this test can be used) for the duration of the COVID-19 declaration under Section 564(b)(1) of the Act, 21 U. S.C. section 360bbb-3(b)(1), unless the authorization is terminated or revoked sooner.   Performed at [facility], [place] [address] [place], [place] [address]     Today   Subjective    [place] today has no headache,no chest abdominal pain,no new weakness tingling or numbness, feels much better wants  to go home today.     Objective   Blood pressure (!) 164/85, pulse 65, temperature 97.8 F (36.6 C), temperature source Oral, resp. rate (!) 23, height 5\' 9"  (1.753 m), weight 80.3 kg, SpO2 93 %.   Intake/Output Summary (Last 24 hours) at 05/03/2021 1149 Last data filed at 05/03/2021 0600 Gross per 24 hour  Intake 1190 ml  Output 450 ml  Net 740 ml    Exam  Awake Alert, No new F.N deficits, Normal affect [place].AT,PERRAL Supple Neck,No JVD, No cervical lymphadenopathy appriciated.  Symmetrical Chest wall movement, Good air movement bilaterally, CTAB RRR,No Gallops,Rubs or new Murmurs, No Parasternal Heave +ve B.Sounds, Abd Soft, Non tender, No organomegaly appriciated, No rebound -guarding or rigidity. No Cyanosis, Clubbing or edema, No new Rash or bruise   Data Review   CBC w Diff:  Lab Results  Component Value Date   WBC 4.0 05/03/2021   HGB 11.7 (L) 05/03/2021   HGB 13.2 11/16/2020   HCT 35.6 (L) 05/03/2021   PLT 128 (L) 05/03/2021   PLT 169 11/16/2020   LYMPHOPCT 24 05/03/2021   MONOPCT 16 05/03/2021   EOSPCT 1 05/03/2021   BASOPCT 0 05/03/2021    CMP:  Lab Results  Component Value Date   NA 138 05/03/2021   NA 140 10/27/2020   K 3.7 05/03/2021   CL 109 05/03/2021   CO2 18 (L) 05/03/2021   BUN 71 (H) 05/03/2021   CREATININE 2.72 (H) 05/03/2021   CREATININE 2.04 (H) 11/16/2020   GLU 100 10/27/2020   PROT 5.2 (L) 05/03/2021   ALBUMIN 2.1 (L) 05/03/2021   BILITOT 0.7 05/03/2021   BILITOT 0.8 11/16/2020   ALKPHOS 22 (L) 05/03/2021   AST 36 05/03/2021   AST 20 11/16/2020   ALT 22 05/03/2021   ALT 11 11/16/2020  .   Total Time in preparing paper work, data evaluation and todays exam - 12 minutes  Lala Lund M.D on 05/03/2021 at 11:49 AM  [facility]

## 2021-05-03 NOTE — Progress Notes (Addendum)
Progress Note  Patient Name: Shane [name] Date of Encounter: 05/03/2021  Primary Cardiologist:   None   Subjective   He is anxious to go home.  Desaturates on RA so home with O2.  Complaining of right shoulder pain.   Inpatient Medications    Scheduled Meds:  allopurinol  200 mg Oral Daily   aspirin  81 mg Oral Daily   atenolol  25 mg Oral Daily   doxycycline  100 mg Oral BID   finasteride  5 mg Oral Daily   guaiFENesin  1,200 mg Oral BID   heparin  5,000 Units Subcutaneous Q12H   Ipratropium-Albuterol  1 puff Inhalation Q6H   isosorbide mononitrate  15 mg Oral Daily   multivitamin  1 tablet Oral Daily   simvastatin  10 mg Oral QHS   sodium bicarbonate  650 mg Oral TID   Continuous Infusions:  cefTRIAXone (ROCEPHIN)  IV 1 g (05/02/21 1845)   PRN Meds: acetaminophen, albuterol, benzonatate, hydrALAZINE, [DISCONTINUED] ondansetron **OR** ondansetron (ZOFRAN) IV   Vital Signs    Vitals:   05/02/21 1939 05/03/21 0000 05/03/21 0312 05/03/21 0746  BP: (!) 152/83 (!) 119/98 125/66 (!) 164/85  Pulse: 75 62 66 65  Resp: 18 18 20  (!) 23  Temp: 98.1 F (36.7 C) (!) 97.4 F (36.3 C) 98.1 F (36.7 C) 97.8 F (36.6 C)  TempSrc: Oral Oral Oral Oral  SpO2: 95% 93% 93% 93%  Weight:      Height:        Intake/Output Summary (Last 24 hours) at 05/03/2021 1054 Last data filed at 05/03/2021 0600 Gross per 24 hour  Intake 1190 ml  Output 450 ml  Net 740 ml   Filed Weights   04/30/21 1009  Weight: 80.3 kg    Telemetry    NA - Personally Reviewed  ECG    NA - Personally Reviewed  Physical Exam   GEN: No acute distress.   Neck: No  JVD Cardiac: RRR, no murmurs, rubs, or gallops.  Respiratory:      Decreased breath sounds.  No basilar crackles.  GI: Soft, nontender, non-distended  MS: No  edema; No deformity. Neuro:  Nonfocal  Psych: Normal affect   Labs    Chemistry Recent Labs  Lab 04/30/21 1111 05/01/21 0257 05/02/21 0116 05/03/21 0230  NA 139 139  138 138  K 3.7 3.8 3.8 3.7  CL 110 115* 109 109  CO2 17* 14* 16* 18*  GLUCOSE 111* 97 106* 97  BUN 63* 65* 73* 71*  CREATININE 2.67* 2.45* 2.87* 2.72*  CALCIUM 8.9 8.3* 8.2* 8.5*  PROT 6.3*  --  5.2* 5.2*  ALBUMIN 2.6*  --  2.2* 2.1*  AST 45*  --  39 36  ALT 21  --  19 22  ALKPHOS 23*  --  20* 22*  BILITOT 1.2  --  0.7 0.7  GFRNONAA 23* 25* 21* 22*  ANIONGAP 12 10 13 11      Hematology Recent Labs  Lab 05/01/21 0257 05/02/21 0116 05/03/21 0230  WBC 5.1 4.9 4.0  RBC 3.82* 3.86* 3.87*  HGB 11.9* 11.7* 11.7*  HCT 36.0* 35.6* 35.6*  MCV 94.2 92.2 92.0  MCH 31.2 30.3 30.2  MCHC 33.1 32.9 32.9  RDW 13.9 14.0 13.9  PLT 132* 129* 128*    Cardiac EnzymesNo results for input(s): TROPONINI in the last 168 hours. No results for input(s): TROPIPOC in the last 168 hours.   BNP Recent Labs  Lab 05/01/21  4818 05/02/21 0116 05/03/21 0230  BNP 59.8 68.8 224.1*     DDimer  Recent Labs  Lab 05/01/21 0742  DDIMER 1.03*     Radiology    DG Chest Port 1 View  Result Date: 05/02/2021 CLINICAL DATA:  Shortness of breath, COVID-19 infection. EXAM: PORTABLE CHEST 1 VIEW COMPARISON:  05/01/2021 and CT chest 04/30/2021. FINDINGS: Trachea is midline. Heart size stable. Thoracic aorta is calcified. Patchy bilateral airspace opacification, worst in the left lower lobe. Overall aeration has improved slightly from yesterday's exam. There may be a small left pleural effusion. Resection of the distal right clavicle. IMPRESSION: 1. Patchy bilateral airspace opacification, worst in the left lower lobe but overall improved from 05/01/2021, supporting improving COVID-19 pneumonia. 2. Small left pleural effusion. Electronically Signed   By: Lorin Picket M.D.   On: 05/02/2021 08:44   ECHOCARDIOGRAM COMPLETE  Result Date: 05/01/2021    ECHOCARDIOGRAM REPORT   Patient Name:   Shane [name] Date of Exam: 05/01/2021 Medical Rec #:  [id number]      Height:       69.0 in Accession #:    [id number]      Weight:       177.0 lb Date of Birth:  Nov 07, 1931      BSA:          1.961 m Patient Age:    87 years       BP:           118/65 mmHg Patient Gender: M              HR:           70 bpm. Exam Location:  Inpatient Procedure: 2D Echo, Cardiac Doppler and Color Doppler Indications:    I50.33 Acute on chronic diastolic (congestive) heart failure  History:        Patient has no prior history of Echocardiogram examinations.                 Risk Factors:Hypertension. Cancer. GERD.  Sonographer:    Jonelle Sidle Dance Referring Phys: [id number] [place]  1. Left ventricular ejection fraction, by estimation, is 55 to 60%. The left ventricle has normal function. The left ventricle has no regional wall motion abnormalities. Left ventricular diastolic parameters are consistent with Grade I diastolic dysfunction (impaired relaxation).  2. Right ventricular systolic function is normal. The right ventricular size is normal. There is mildly elevated pulmonary artery systolic pressure.  3. The mitral valve is normal in structure. No evidence of mitral valve regurgitation. No evidence of mitral stenosis.  4. Tricuspid valve regurgitation is mild to moderate.  5. The aortic valve is normal in structure. Aortic valve regurgitation is mild. No aortic stenosis is present.  6. The inferior vena cava is normal in size with greater than 50% respiratory variability, suggesting right atrial pressure of 3 mmHg. FINDINGS  Left Ventricle: Left ventricular ejection fraction, by estimation, is 55 to 60%. The left ventricle has normal function. The left ventricle has no regional wall motion abnormalities. The left ventricular internal cavity size was normal in size. There is  no left ventricular hypertrophy. Left ventricular diastolic parameters are consistent with Grade I diastolic dysfunction (impaired relaxation). Normal left ventricular filling pressure. Right Ventricle: The right ventricular size is normal. No increase in right  ventricular wall thickness. Right ventricular systolic function is normal. There is mildly elevated pulmonary artery systolic pressure. The tricuspid regurgitant velocity is 2.89  m/s, and with an  assumed right atrial pressure of 8 mmHg, the estimated right ventricular systolic pressure is 16.1 mmHg. Left Atrium: Left atrial size was normal in size. Right Atrium: Right atrial size was normal in size. Pericardium: There is no evidence of pericardial effusion. Mitral Valve: The mitral valve is normal in structure. No evidence of mitral valve regurgitation. No evidence of mitral valve stenosis. Tricuspid Valve: The tricuspid valve is normal in structure. Tricuspid valve regurgitation is mild to moderate. No evidence of tricuspid stenosis. Aortic Valve: The aortic valve is normal in structure. Aortic valve regurgitation is mild. Aortic regurgitation PHT measures 538 msec. No aortic stenosis is present. Pulmonic Valve: The pulmonic valve was normal in structure. Pulmonic valve regurgitation is not visualized. No evidence of pulmonic stenosis. Aorta: The aortic root is normal in size and structure. Venous: The inferior vena cava is normal in size with greater than 50% respiratory variability, suggesting right atrial pressure of 3 mmHg. IAS/Shunts: No atrial level shunt detected by color flow Doppler.  LEFT VENTRICLE PLAX 2D LVIDd:         4.50 cm     Diastology LVIDs:         3.20 cm     LV e' medial:    5.98 cm/s LV PW:         1.30 cm     LV E/e' medial:  11.2 LV IVS:        1.50 cm     LV e' lateral:   9.46 cm/s LVOT diam:     2.40 cm     LV E/e' lateral: 7.1 LV SV:         84 LV SV Index:   43 LVOT Area:     4.52 cm  LV Volumes (MOD) LV vol d, MOD A2C: 59.8 ml LV vol d, MOD A4C: 84.4 ml LV vol s, MOD A2C: 28.8 ml LV vol s, MOD A4C: 37.8 ml LV SV MOD A2C:     31.0 ml LV SV MOD A4C:     84.4 ml LV SV MOD BP:      37.0 ml RIGHT VENTRICLE RV Basal diam:  2.90 cm RV S prime:     11.90 cm/s TAPSE (M-mode): 1.6 cm LEFT  ATRIUM             Index       RIGHT ATRIUM           Index LA diam:        3.70 cm 1.89 cm/m  RA Area:     14.80 cm LA Vol (A2C):   48.3 ml 24.62 ml/m RA Volume:   34.10 ml  17.38 ml/m LA Vol (A4C):   27.0 ml 13.77 ml/m LA Biplane Vol: 37.7 ml 19.22 ml/m  AORTIC VALVE LVOT Vmax:   103.00 cm/s LVOT Vmean:  63.950 cm/s LVOT VTI:    0.185 m AI PHT:      538 msec  AORTA Ao Root diam: 3.60 cm Ao Asc diam:  3.50 cm MITRAL VALVE               TRICUSPID VALVE MV Area (PHT): 2.66 cm    TR Peak grad:   33.4 mmHg MV Decel Time: 285 msec    TR Vmax:        289.00 cm/s MV E velocity: 66.70 cm/s MV A velocity: 79.60 cm/s  SHUNTS MV E/A ratio:  0.84        Systemic VTI:  0.18 m  Systemic Diam: 2.40 cm Sanda Klein MD Electronically signed by Sanda Klein MD Signature Date/Time: 05/01/2021/4:49:09 PM    Final     Cardiac Studies   ECHO:   1. Left ventricular ejection fraction, by estimation, is 55 to 60%. The  left ventricle has normal function. The left ventricle has no regional  wall motion abnormalities. Left ventricular diastolic parameters are  consistent with Grade I diastolic  dysfunction (impaired relaxation).   2. Right ventricular systolic function is normal. The right ventricular  size is normal. There is mildly elevated pulmonary artery systolic  pressure.   3. The mitral valve is normal in structure. No evidence of mitral valve  regurgitation. No evidence of mitral stenosis.   4. Tricuspid valve regurgitation is mild to moderate.   5. The aortic valve is normal in structure. Aortic valve regurgitation is  mild. No aortic stenosis is present.   6. The inferior vena cava is normal in size with greater than 50%  respiratory variability, suggesting right atrial pressure of 3 mmHg.   Patient Profile     87 y.o. male with medical history significant of MGUS, stage IV, HTN, came with worsening of shortness of breath and productive cough with white frothy phlegm, of  note he was diagnosed with COVID-19 on July 16 but had no symptoms, he is fully vaccinated and boosted.  In the ER he was diagnosed with pneumonia, hypoxic respiratory failure and admitted to the hospital  Assessment & Plan    Acute hypoxic respiratory failure:   Slow improvement.  He does have some low O2s with ambulation on RA so will have PRN O2 likely prescribed at discharge.  I do note the increased BNP and perhaps mild vascular congestion on CXR.  I would suggest PRN home Lasix low dose and I talked to his daughter about when she might use this.   Elevated troponin:  I see again in one month and consider further evaluation  HTN:  BP is going up.  I would like to avoid ACE/ARB .  Started Norvasc  CKD IV:  Creat is stable.  The BNP is slightly elevated.    For questions or updates, please contact [place] Please consult [email or web address] for contact info under Cardiology/STEMI.   Signed, Minus Breeding, MD  05/03/2021, 10:54 AM

## 2021-05-04 DIAGNOSIS — I130 Hypertensive heart and chronic kidney disease with heart failure and stage 1 through stage 4 chronic kidney disease, or unspecified chronic kidney disease: Secondary | ICD-10-CM | POA: Diagnosis not present

## 2021-05-04 DIAGNOSIS — E669 Obesity, unspecified: Secondary | ICD-10-CM | POA: Diagnosis not present

## 2021-05-04 DIAGNOSIS — Z6826 Body mass index (BMI) 26.0-26.9, adult: Secondary | ICD-10-CM | POA: Diagnosis not present

## 2021-05-04 DIAGNOSIS — U071 COVID-19: Secondary | ICD-10-CM | POA: Diagnosis not present

## 2021-05-04 DIAGNOSIS — J1282 Pneumonia due to coronavirus disease 2019: Secondary | ICD-10-CM | POA: Diagnosis not present

## 2021-05-04 DIAGNOSIS — J45909 Unspecified asthma, uncomplicated: Secondary | ICD-10-CM | POA: Diagnosis not present

## 2021-05-04 DIAGNOSIS — Z9181 History of falling: Secondary | ICD-10-CM | POA: Diagnosis not present

## 2021-05-04 DIAGNOSIS — I5033 Acute on chronic diastolic (congestive) heart failure: Secondary | ICD-10-CM | POA: Diagnosis not present

## 2021-05-04 DIAGNOSIS — J9601 Acute respiratory failure with hypoxia: Secondary | ICD-10-CM | POA: Diagnosis not present

## 2021-05-04 DIAGNOSIS — N184 Chronic kidney disease, stage 4 (severe): Secondary | ICD-10-CM | POA: Diagnosis not present

## 2021-05-07 ENCOUNTER — Telehealth: Payer: Self-pay | Admitting: Pharmacist

## 2021-05-07 ENCOUNTER — Telehealth: Payer: Self-pay | Admitting: Adult Health

## 2021-05-07 DIAGNOSIS — J9601 Acute respiratory failure with hypoxia: Secondary | ICD-10-CM | POA: Diagnosis not present

## 2021-05-07 DIAGNOSIS — N184 Chronic kidney disease, stage 4 (severe): Secondary | ICD-10-CM | POA: Diagnosis not present

## 2021-05-07 DIAGNOSIS — I130 Hypertensive heart and chronic kidney disease with heart failure and stage 1 through stage 4 chronic kidney disease, or unspecified chronic kidney disease: Secondary | ICD-10-CM | POA: Diagnosis not present

## 2021-05-07 DIAGNOSIS — U071 COVID-19: Secondary | ICD-10-CM | POA: Diagnosis not present

## 2021-05-07 DIAGNOSIS — J45909 Unspecified asthma, uncomplicated: Secondary | ICD-10-CM | POA: Diagnosis not present

## 2021-05-07 DIAGNOSIS — Z6826 Body mass index (BMI) 26.0-26.9, adult: Secondary | ICD-10-CM | POA: Diagnosis not present

## 2021-05-07 DIAGNOSIS — E669 Obesity, unspecified: Secondary | ICD-10-CM | POA: Diagnosis not present

## 2021-05-07 DIAGNOSIS — I5033 Acute on chronic diastolic (congestive) heart failure: Secondary | ICD-10-CM | POA: Diagnosis not present

## 2021-05-07 DIAGNOSIS — J1282 Pneumonia due to coronavirus disease 2019: Secondary | ICD-10-CM | POA: Diagnosis not present

## 2021-05-07 DIAGNOSIS — Z9181 History of falling: Secondary | ICD-10-CM | POA: Diagnosis not present

## 2021-05-07 NOTE — Progress Notes (Addendum)
Chronic Care Management Pharmacy Assistant   Name: Shane [name]  MRN: [id number] DOB: 11-Jul-1932  Reason for Encounter: Medication Review Medication Coordination Call    Recent office visits:  04-25-2021 Burnis Medin, MD - Patient presented for respiratory tract infection due to [place]. Prescribed Albuterol PRN and Benzonatate 100 mg PRN   Recent consult visits:  None  Hospital visits:  Medication Reconciliation was completed by comparing discharge summary, patient's EMR and Pharmacy list, and upon discussion with patient.  Admitted to [facility] 04-30-2021 due to Pneumonia /COVID. Discharge date was 05-04-2021.   New?Medications Started at [facility] Discharge:?? -started  Asprin 81mg  IMDUR 15mg     Medication Changes at Hospital Discharge: -Changed None  Medications Discontinued at Hospital Discharge: -Stopped Lisinopril  Medications that remain the same after Hospital Discharge:??  -All other medications will remain the same.    Medication Reconciliation was completed by comparing discharge summary, patient's EMR and Pharmacy list, and upon discussion with patient.   Presented  to [facility]  ED 04-27-2021 due to Pneumonia /COVID. Patient was there for 4 hours.  New?Medications Started at [facility] Discharge:?? -started Doxycycline 100 mg twice daily for 7 days  Medication Changes at Hospital Discharge: -Changed None  Medications Discontinued at Hospital Discharge: -Stopped None  Medications that remain the same after Hospital Discharge:??  -All other medications will remain the same.   Medications: Outpatient Encounter Medications as of 05/07/2021  Medication Sig Note   acetaminophen (TYLENOL) 500 MG tablet Take 500-1,000 mg by mouth every 6 (six) hours as needed (for pain).    albuterol (VENTOLIN HFA) 108 (90 Base) MCG/ACT inhaler Inhale 2 puffs into the lungs every 6 (six) hours as needed for wheezing or shortness of breath.     allopurinol (ZYLOPRIM) 300 MG tablet Take 1 tablet (300 mg total) by mouth daily.    amLODipine (NORVASC) 5 MG tablet Take 1 tablet (5 mg total) by mouth daily.    aspirin 81 MG chewable tablet Chew 1 tablet (81 mg total) by mouth daily.    atenolol (TENORMIN) 25 MG tablet Take 1 tablet (25 mg total) by mouth daily.    benzonatate (TESSALON PERLES) 100 MG capsule Take 1 capsule (100 mg total) by mouth 3 (three) times daily as needed for cough.    budesonide (ENTOCORT EC) 3 MG 24 hr capsule Take 3 capsules (9 mg total) by mouth daily. 04/30/2021: Pt requests refill    chlorthalidone (HYGROTON) 25 MG tablet Take 0.5 tablets (12.5 mg total) by mouth daily.    finasteride (PROSCAR) 5 MG tablet Take 1 tablet (5 mg total) by mouth daily.    furosemide (LASIX) 40 MG tablet Take 0.5 tablets (20 mg total) by mouth daily as needed for fluid or edema (from baseline67from baseline).    isosorbide mononitrate (IMDUR) 30 MG 24 hr tablet Take 0.5 tablets (15 mg total) by mouth daily.    Multiple Vitamins-Minerals (PRESERVISION AREDS 2) CAPS Take 1 capsule by mouth 2 (two) times daily.    potassium chloride (KLOR-CON) 10 MEQ tablet Take 1 tablet (10 mEq total) by mouth daily as needed (Take only if you are taking Lasix that day).    simvastatin (ZOCOR) 10 MG tablet Take 1 tablet (10 mg total) by mouth at bedtime.    No facility-administered encounter medications on file as of 05/07/2021.  Reviewed chart for medication changes ahead of medication coordination call.  No OVs, Consults, or hospital visits since last care coordination  call/Pharmacist visit.   No medication changes indicated   BP Readings from Last 3 Encounters:  05/03/21 113/75  04/27/21 (!) 125/110  01/09/21 (!) 142/90    No results found for: HGBA1C   Patient obtains medications through Adherence Packaging  30 Days   Last adherence delivery included:  Atenolol 25mg  - take one tablet with breakfast daily. Simvastatin 10mg  - take one tablet  by mouth at bedtime. Allopurinol 300mg  - take one tablet by mouth with breakfast daily. Chlorthalidone 25mg  - take 0.5 tablet with breakfast daily. Finasteride 5mg  - take one tablet at bedtime daily. Lisinopril 5mg  - take one tablet with breakfast and one tablet at bedtime daily  Patient declined last month  Budesonide 3mg  ER - take 3 capsules with breakfast daily  Patient is due for next adherence delivery on: 05-09-2021. Called patient and reviewed medications and coordinated delivery. Spoke with daughter Claiborne Billings  This delivery to include: Atenolol 25mg  - take one tablet with breakfast daily. Simvastatin 10mg  - take one tablet by mouth at bedtime. Allopurinol 300mg  - take one tablet by mouth with breakfast daily. Chlorthalidone 25mg  - take 0.5 tablet with breakfast daily. Finasteride 5mg  - take one tablet at bedtime daily. Amlodipine 5mg  - take one tablet with breakfast daily Isosorbide monitrate 3mg  - take half tablet daily at breakfast  Patient declined the following medications med's due to on hand supply  Albuterol HFA Lisinopril 5mg  - take one tablet with breakfast and one tablet at bedtime daily. (changed to Amlodipine 5 mg daily at breakfast  but pt has 30 pills on hand)  Patient needs refills for  Isosorbide monitrate 3mg  - take half tablet daily at breakfast has 15 pills on hand  Confirmed delivery date of 05-09-2021, advised patient that pharmacy will contact them the morning of delivery.    Star Rating Drugs: Simvastatin 10mg  - last filled on 04-04-2021 30 DS at [facility]  05-08-21 Offered CCM F/U appointment for 9/8 @ 3pm Dgt Claiborne Billings in agreement  [place] Pharmacist Assistant [phone]

## 2021-05-07 NOTE — Telephone Encounter (Signed)
Shane [name] from [facility] call and stated she need a plan of care approve  for phyical therapy for one week -1 and two weeks 3 and 1 week -5 .Shane [name] 's # is [phone].

## 2021-05-07 NOTE — Telephone Encounter (Signed)
Kiki nurse from  [facility] call and stated she need a verbal order to complete a covid test on patient # [phone].

## 2021-05-07 NOTE — Telephone Encounter (Signed)
Merrie Roof from [facility] called to say that patient was in the hospital last week and was given orders for home physical therapy. Merrie Roof is needing verbal orders from [facility] for home physical therapy for  1 week 1, 2 week 3, 1 week 5.   Medicare is wanting verbal orders within 24 hours but told Merrie Roof that Tommi Rumps is out of the office today.  Please advise.

## 2021-05-08 NOTE — Telephone Encounter (Signed)
[place] returned call to check to status for verbal order and I gave verbal orders to [place].

## 2021-05-08 NOTE — Telephone Encounter (Signed)
Okay for verbal orders? Please advise 

## 2021-05-08 NOTE — Telephone Encounter (Signed)
Verbal orders given to [place] 

## 2021-05-08 NOTE — Telephone Encounter (Signed)
Verbal orders given to [place].

## 2021-05-08 NOTE — Telephone Encounter (Signed)
[place] called to receive verbal orders

## 2021-05-08 NOTE — Telephone Encounter (Signed)
Verbal orders given to [facility]. [facility] advised that pt tested positive again this week. Pt has an upcoming appt. For possibly UTI. Pt daughter who has not have had any contact with pt will pick up urine sample from pt and drop it off before pt appt.

## 2021-05-09 DIAGNOSIS — U071 COVID-19: Secondary | ICD-10-CM | POA: Diagnosis not present

## 2021-05-09 DIAGNOSIS — J1282 Pneumonia due to coronavirus disease 2019: Secondary | ICD-10-CM | POA: Diagnosis not present

## 2021-05-09 DIAGNOSIS — Z6826 Body mass index (BMI) 26.0-26.9, adult: Secondary | ICD-10-CM | POA: Diagnosis not present

## 2021-05-09 DIAGNOSIS — E669 Obesity, unspecified: Secondary | ICD-10-CM | POA: Diagnosis not present

## 2021-05-09 DIAGNOSIS — Z9181 History of falling: Secondary | ICD-10-CM | POA: Diagnosis not present

## 2021-05-09 DIAGNOSIS — I130 Hypertensive heart and chronic kidney disease with heart failure and stage 1 through stage 4 chronic kidney disease, or unspecified chronic kidney disease: Secondary | ICD-10-CM | POA: Diagnosis not present

## 2021-05-09 DIAGNOSIS — J9601 Acute respiratory failure with hypoxia: Secondary | ICD-10-CM | POA: Diagnosis not present

## 2021-05-09 DIAGNOSIS — I5033 Acute on chronic diastolic (congestive) heart failure: Secondary | ICD-10-CM | POA: Diagnosis not present

## 2021-05-09 DIAGNOSIS — J45909 Unspecified asthma, uncomplicated: Secondary | ICD-10-CM | POA: Diagnosis not present

## 2021-05-09 DIAGNOSIS — N184 Chronic kidney disease, stage 4 (severe): Secondary | ICD-10-CM | POA: Diagnosis not present

## 2021-05-10 ENCOUNTER — Other Ambulatory Visit: Payer: Self-pay

## 2021-05-10 ENCOUNTER — Encounter: Payer: Self-pay | Admitting: Adult Health

## 2021-05-10 ENCOUNTER — Ambulatory Visit (INDEPENDENT_AMBULATORY_CARE_PROVIDER_SITE_OTHER)
Admission: RE | Admit: 2021-05-10 | Discharge: 2021-05-10 | Disposition: A | Discharge status: Home / Self Care | Payer: Medicare HMO | Source: Ambulatory Visit | Attending: Adult Health | Admitting: Adult Health

## 2021-05-10 ENCOUNTER — Ambulatory Visit (INDEPENDENT_AMBULATORY_CARE_PROVIDER_SITE_OTHER): Payer: Medicare HMO | Admitting: Adult Health

## 2021-05-10 VITALS — BP 100/64 | HR 102 | Temp 96.9°F | Ht 69.0 in

## 2021-05-10 DIAGNOSIS — U071 COVID-19: Secondary | ICD-10-CM

## 2021-05-10 DIAGNOSIS — R410 Disorientation, unspecified: Secondary | ICD-10-CM | POA: Diagnosis not present

## 2021-05-10 DIAGNOSIS — R0902 Hypoxemia: Secondary | ICD-10-CM

## 2021-05-10 DIAGNOSIS — R630 Anorexia: Secondary | ICD-10-CM

## 2021-05-10 DIAGNOSIS — I10 Essential (primary) hypertension: Secondary | ICD-10-CM | POA: Diagnosis not present

## 2021-05-10 DIAGNOSIS — G479 Sleep disorder, unspecified: Secondary | ICD-10-CM | POA: Diagnosis not present

## 2021-05-10 DIAGNOSIS — J1282 Pneumonia due to coronavirus disease 2019: Secondary | ICD-10-CM

## 2021-05-10 DIAGNOSIS — R059 Cough, unspecified: Secondary | ICD-10-CM | POA: Diagnosis not present

## 2021-05-10 DIAGNOSIS — R0602 Shortness of breath: Secondary | ICD-10-CM | POA: Diagnosis not present

## 2021-05-10 DIAGNOSIS — U099 Post covid-19 condition, unspecified: Secondary | ICD-10-CM | POA: Diagnosis not present

## 2021-05-10 DIAGNOSIS — N289 Disorder of kidney and ureter, unspecified: Secondary | ICD-10-CM | POA: Diagnosis not present

## 2021-05-10 DIAGNOSIS — J189 Pneumonia, unspecified organism: Secondary | ICD-10-CM | POA: Diagnosis not present

## 2021-05-10 DIAGNOSIS — Y95 Nosocomial condition: Secondary | ICD-10-CM | POA: Diagnosis not present

## 2021-05-10 MED ORDER — MIRTAZAPINE 30 MG PO TABS: 30.0000 mg | ORAL_TABLET | Freq: Every day | ORAL | 0 refills | Status: DC

## 2021-05-10 MED ORDER — MIRTAZAPINE 15 MG PO TABS: 15.0000 mg | ORAL_TABLET | Freq: Every day | ORAL | 0 refills | Status: DC

## 2021-05-10 NOTE — Progress Notes (Signed)
Subjective:    Patient ID: Shane [name], male    DOB: [birth year], 87 y.o.   MRN: [id number]  HPI 87 year old male who  has a past medical history of Anemia, iron deficiency, C. difficile diarrhea, Cancer ([facility]), Cataract, DDD (degenerative disc disease), cervical, Dysplastic nevus of left upper extremity, Elevated PSA, Essential hypertension, Extrinsic asthma, GERD (gastroesophageal reflux disease), Gout, Hiatal hernia, Macular degeneration, and Spinal stenosis.  He presents to the office today for TCM visit. He is with his daughter today who helps supplement history   Admit Date 04/30/2021 Discharge Date 05/03/2021  He presented to the emergency room with worsening shortness of breath and productive cough with white frothy phlegm.  He was diagnosed with COVID-19 on July 16 but had no symptoms, he was fully vaccinated and boosted.  In the ER he was diagnosed with pneumonia, hypoxic respiratory failure, and admitted to the hospital for further management  Hospital Course   1) acute hypoxic respiratory failure -due to community-acquired pneumonia along with acute on chronic CHF diastolic CHF with a EF of 55 to 60% he was placed on antibiotics and diuresed with IV Lasix.  Upon discharge shortness of breath and checks x-ray were improved.  At rest he was on room air, upon ambulation he was qualified for 2 L nasal cannula oxygen which he was discharged with.  He was kept on 3 days of oral antibiotics, Keflex and doxycycline.  He was also given Lasix 20 mg as needed and advised to follow-up with cardiology in 7 to 10 days following discharge  2) mild  elevation of troponin-trended flat and in non-ACS pattern.  He had no chest pain or abnormal EKG.  His echocardiogram with preserved EF and no wall motion abnormality, likely mild bump in troponin due to hypoxia and demand.  3) CKD IV with AKA -Baseline creatinine around 2, AKI improved.  ACE inhibitor was discontinued and he was placed on low-dose  Norvasc. Lab Results  Component Value Date   CREATININE 2.72 (H) 05/03/2021   BUN 71 (H) 05/03/2021   NA 138 05/03/2021   K 3.7 05/03/2021   CL 109 05/03/2021   CO2 18 (L) 05/03/2021    Today's daughter reports that he does not seem to be improving since being discharged.  He continues to have a productive cough with white frothy sputum despite finishing his antibiotics.  He is generally weak, not sleeping, not eating or drinking and there seems to be intermittent episodes of confusion.  He has had to transition from a cane to a walker due to weakness.  Patient reports that he has no appetite and does not feel like eating or drinking anything.  His daughter has tried using 10 mg of melatonin to help him sleep but he continues to be up throughout the night.  He has not been taking his Lasix nor his potassium due to decreased p.o. intake and no signs of volume overload.  Wt Readings from Last 3 Encounters:  04/30/21 177 lb (80.3 kg)  04/25/21 196 lb (88.9 kg)  01/09/21 196 lb (88.9 kg)      Review of Systems  Constitutional:  Positive for appetite change and fatigue.  Respiratory:  Positive for cough. Negative for chest tightness, shortness of breath and wheezing.   Cardiovascular: Negative.   Gastrointestinal: Negative.   Musculoskeletal:  Positive for gait problem.  Neurological:  Positive for weakness.  Psychiatric/Behavioral:  Positive for confusion and sleep disturbance.   All other systems reviewed  and are negative.   Past Medical History:  Diagnosis Date   Anemia, iron deficiency    C. difficile diarrhea    Cancer ([facility])    forearm-  squamous   Cataract    bil cataracts removed   DDD (degenerative disc disease), cervical    Dysplastic nevus of left upper extremity    Elevated PSA    Essential hypertension    Extrinsic asthma    GERD (gastroesophageal reflux disease)    no recent   Gout    Hiatal hernia    Macular degeneration    Spinal stenosis      Social History   Socioeconomic History   Marital status: Married    Spouse name: Not on file   Number of children: 3   Years of education: Not on file   Highest education level: Not on file  Occupational History   Occupation: retired  Tobacco Use   Smoking status: Former    Years: 35.00    Types: Cigarettes   Smokeless tobacco: Never   Tobacco comments:    quit late 1980's  Vaping Use   Vaping Use: Never used  Substance and Sexual Activity   Alcohol use: No   Drug use: No   Sexual activity: Not on file  Other Topics Concern   Not on file  Social History Narrative   Retired from [employer], educational    Married for 55 years    Three children all live locally    Likes to play golf and go to [employer].    Social Determinants of Health   Financial Resource Strain: Low Risk    Difficulty of Paying Living Expenses: Not hard at all  Food Insecurity: Not on file  Transportation Needs: No Transportation Needs   Lack of Transportation (Medical): No   Lack of Transportation (Non-Medical): No  Physical Activity: Inactive   Days of Exercise per Week: 0 days   Minutes of Exercise per Session: 0 min  Stress: No Stress Concern Present   Feeling of Stress : Not at all  Social Connections: Moderately Integrated   Frequency of Communication with Friends and Family: Twice a week   Frequency of Social Gatherings with Friends and Family: Once a week   Attends Religious Services: More than 4 times per year   Active Member of [employer] or Organizations: No   Attends Archivist Meetings: Never   Marital Status: Married  Human resources officer Violence: Not At Risk   Fear of Current or Ex-Partner: No   Emotionally Abused: No   Physically Abused: No   Sexually Abused: No    Past Surgical History:  Procedure Laterality Date   BACK SURGERY     Bone Spur Right    Shoulder   COLONOSCOPY     COLONOSCOPY W/ POLYPECTOMY     COLONOSCOPY WITH PROPOFOL N/A 11/19/2016   Procedure:  COLONOSCOPY WITH PROPOFOL;  Surgeon: Doran Stabler, MD;  Location: [facility];  Service: Gastroenterology;  Laterality: N/A;   FECAL TRANSPLANT N/A 11/19/2016   Procedure: FECAL TRANSPLANT;  Surgeon: Doran Stabler, MD;  Location: [facility];  Service: Gastroenterology;  Laterality: N/A;   MOUTH SURGERY     2 teeth removed resulting in a dry socket in one of them   POLYPECTOMY     SKIN CANCER EXCISION      Family History  Problem Relation Age of Onset   Multiple myeloma Brother    Prostate cancer Brother  Hypertension Father        ?   Lung cancer Sister    Colon cancer Neg Hx    Stomach cancer Neg Hx    Esophageal cancer Neg Hx    Rectal cancer Neg Hx     No Known Allergies  Current Outpatient Medications on File Prior to Visit  Medication Sig Dispense Refill   acetaminophen (TYLENOL) 500 MG tablet Take 500-1,000 mg by mouth every 6 (six) hours as needed (for pain).     albuterol (VENTOLIN HFA) 108 (90 Base) MCG/ACT inhaler Inhale 2 puffs into the lungs every 6 (six) hours as needed for wheezing or shortness of breath. 1 each 1   allopurinol (ZYLOPRIM) 300 MG tablet Take 1 tablet (300 mg total) by mouth daily. 90 tablet 3   amLODipine (NORVASC) 5 MG tablet Take 1 tablet (5 mg total) by mouth daily. 30 tablet 0   aspirin 81 MG chewable tablet Chew 1 tablet (81 mg total) by mouth daily. 30 tablet 0   atenolol (TENORMIN) 25 MG tablet Take 1 tablet (25 mg total) by mouth daily. 90 tablet 3   benzonatate (TESSALON PERLES) 100 MG capsule Take 1 capsule (100 mg total) by mouth 3 (three) times daily as needed for cough. 20 capsule 0   budesonide (ENTOCORT EC) 3 MG 24 hr capsule Take 3 capsules (9 mg total) by mouth daily. 270 capsule 1   chlorthalidone (HYGROTON) 25 MG tablet Take 0.5 tablets (12.5 mg total) by mouth daily. 45 tablet 3   finasteride (PROSCAR) 5 MG tablet Take 1 tablet (5 mg total) by mouth daily. 90 tablet 3   furosemide (LASIX) 40 MG tablet Take 0.5 tablets  (20 mg total) by mouth daily as needed for fluid or edema (from baseline60fom baseline). 30 tablet 0   isosorbide mononitrate (IMDUR) 30 MG 24 hr tablet Take 0.5 tablets (15 mg total) by mouth daily. 30 tablet 0   Multiple Vitamins-Minerals (PRESERVISION AREDS 2) CAPS Take 1 capsule by mouth 2 (two) times daily.     potassium chloride (KLOR-CON) 10 MEQ tablet Take 1 tablet (10 mEq total) by mouth daily as needed (Take only if you are taking Lasix that day). 30 tablet 0   simvastatin (ZOCOR) 10 MG tablet Take 1 tablet (10 mg total) by mouth at bedtime. 90 tablet 3   No current facility-administered medications on file prior to visit.    BP 100/64   Pulse (!) 102   Temp (!) 96.9 F (36.1 C) (Oral)   Ht '5\' 9"'  (1.753 m)   SpO2 93%   BMI 26.14 kg/m       Objective:   Physical Exam Vitals and nursing note reviewed.  HENT:     Mouth/Throat:     Mouth: Mucous membranes are dry.     Pharynx: Oropharynx is clear. Uvula midline.  Cardiovascular:     Rate and Rhythm: Normal rate and regular rhythm.     Pulses: Normal pulses.     Heart sounds: Normal heart sounds.  Pulmonary:     Effort: Pulmonary effort is normal.     Breath sounds: Normal breath sounds.  Musculoskeletal:        General: Normal range of motion.     Right lower leg: No edema.     Left lower leg: No edema.  Skin:    General: Skin is warm and dry.  Neurological:     Mental Status: He is alert and oriented to person, place, and time.  Psychiatric:        Attention and Perception: Attention and perception normal.        Mood and Affect: Affect is flat.        Behavior: Behavior is slowed. Behavior is cooperative.        Thought Content: Thought content normal.        Cognition and Memory: Cognition normal.        Judgment: Judgment normal.      Assessment & Plan:  1. Pneumonia due to COVID-19 virus -Reviewed hospital discharge instructions, admission notes, labs, and imaging.  All questions answered to the best of  my ability.  We will repeat chest x-ray today CBC, and CMP.  Patient does appear to be in a weaker state and what we are accustomed to seeing him.  Encouraged hydration and p.o. intake.  If this does not improve will likely need to go back to the emergency room.  - Consider Levaquin  - DG Chest 2 View; Future - CBC with Differential/Platelet; Future - Comprehensive metabolic panel; Future - CBC with Differential/Platelet - Comprehensive metabolic panel  2. Essential hypertension -Hypotensive today.  Likely due to volume depletion.  Encourage fluids  3. Function kidney decreased  - POC Urinalysis Dipstick-urine negative for infection.  Positive protein likely due to dehydration  4. Hypoxia  - DG Chest 2 View; Future - CBC with Differential/Platelet; Future - Comprehensive metabolic panel; Future - CBC with Differential/Platelet - Comprehensive metabolic panel  5. Sleep disturbance -Family is staying with him.  We will place him on Remeron to help with appetite and sleep disturbance.  Side effects reviewed. - mirtazapine (REMERON) 30 MG tablet; Take 1 tablet (30 mg total) by mouth at bedtime.  Dispense: 30 tablet; Refill: 0  6. Decreased appetite  - mirtazapine (REMERON) 30 MG tablet; Take 1 tablet (30 mg total) by mouth at bedtime.  Dispense: 30 tablet; Refill: 0  7. Confusion  - POC Urinalysis Dipstick  Dorothyann Peng, NP

## 2021-05-11 ENCOUNTER — Inpatient Hospital Stay (HOSPITAL_COMMUNITY)
Admission: EM | Admit: 2021-05-11 | Discharge: 2021-05-15 | DRG: 682 | Disposition: A | Discharge status: Hospice | Payer: Medicare HMO | Attending: Internal Medicine | Admitting: Internal Medicine

## 2021-05-11 ENCOUNTER — Other Ambulatory Visit: Payer: Self-pay

## 2021-05-11 ENCOUNTER — Telehealth: Payer: Self-pay | Admitting: Adult Health

## 2021-05-11 ENCOUNTER — Emergency Department (HOSPITAL_COMMUNITY): Payer: Medicare HMO

## 2021-05-11 ENCOUNTER — Encounter (HOSPITAL_COMMUNITY): Payer: Self-pay | Admitting: Emergency Medicine

## 2021-05-11 DIAGNOSIS — J189 Pneumonia, unspecified organism: Secondary | ICD-10-CM

## 2021-05-11 DIAGNOSIS — Z9181 History of falling: Secondary | ICD-10-CM | POA: Diagnosis not present

## 2021-05-11 DIAGNOSIS — E781 Pure hyperglyceridemia: Secondary | ICD-10-CM | POA: Diagnosis present

## 2021-05-11 DIAGNOSIS — I5033 Acute on chronic diastolic (congestive) heart failure: Secondary | ICD-10-CM | POA: Diagnosis not present

## 2021-05-11 DIAGNOSIS — Z8249 Family history of ischemic heart disease and other diseases of the circulatory system: Secondary | ICD-10-CM

## 2021-05-11 DIAGNOSIS — N184 Chronic kidney disease, stage 4 (severe): Secondary | ICD-10-CM | POA: Diagnosis not present

## 2021-05-11 DIAGNOSIS — K219 Gastro-esophageal reflux disease without esophagitis: Secondary | ICD-10-CM | POA: Diagnosis present

## 2021-05-11 DIAGNOSIS — N1832 Chronic kidney disease, stage 3b: Secondary | ICD-10-CM

## 2021-05-11 DIAGNOSIS — H3530 Unspecified macular degeneration: Secondary | ICD-10-CM | POA: Diagnosis present

## 2021-05-11 DIAGNOSIS — Z79899 Other long term (current) drug therapy: Secondary | ICD-10-CM

## 2021-05-11 DIAGNOSIS — Z7982 Long term (current) use of aspirin: Secondary | ICD-10-CM

## 2021-05-11 DIAGNOSIS — E860 Dehydration: Secondary | ICD-10-CM

## 2021-05-11 DIAGNOSIS — I5032 Chronic diastolic (congestive) heart failure: Secondary | ICD-10-CM | POA: Diagnosis not present

## 2021-05-11 DIAGNOSIS — U071 COVID-19: Secondary | ICD-10-CM | POA: Diagnosis not present

## 2021-05-11 DIAGNOSIS — M109 Gout, unspecified: Secondary | ICD-10-CM | POA: Diagnosis present

## 2021-05-11 DIAGNOSIS — G9341 Metabolic encephalopathy: Secondary | ICD-10-CM | POA: Diagnosis present

## 2021-05-11 DIAGNOSIS — I10 Essential (primary) hypertension: Secondary | ICD-10-CM | POA: Diagnosis not present

## 2021-05-11 DIAGNOSIS — J1282 Pneumonia due to coronavirus disease 2019: Secondary | ICD-10-CM | POA: Diagnosis not present

## 2021-05-11 DIAGNOSIS — Y95 Nosocomial condition: Secondary | ICD-10-CM | POA: Diagnosis present

## 2021-05-11 DIAGNOSIS — Z8616 Personal history of COVID-19: Secondary | ICD-10-CM

## 2021-05-11 DIAGNOSIS — R5381 Other malaise: Secondary | ICD-10-CM | POA: Diagnosis not present

## 2021-05-11 DIAGNOSIS — D509 Iron deficiency anemia, unspecified: Secondary | ICD-10-CM | POA: Diagnosis present

## 2021-05-11 DIAGNOSIS — J45909 Unspecified asthma, uncomplicated: Secondary | ICD-10-CM | POA: Diagnosis not present

## 2021-05-11 DIAGNOSIS — N400 Enlarged prostate without lower urinary tract symptoms: Secondary | ICD-10-CM

## 2021-05-11 DIAGNOSIS — E785 Hyperlipidemia, unspecified: Secondary | ICD-10-CM | POA: Diagnosis present

## 2021-05-11 DIAGNOSIS — N179 Acute kidney failure, unspecified: Principal | ICD-10-CM

## 2021-05-11 DIAGNOSIS — Z515 Encounter for palliative care: Secondary | ICD-10-CM

## 2021-05-11 DIAGNOSIS — J9601 Acute respiratory failure with hypoxia: Secondary | ICD-10-CM | POA: Diagnosis not present

## 2021-05-11 DIAGNOSIS — J90 Pleural effusion, not elsewhere classified: Secondary | ICD-10-CM | POA: Diagnosis not present

## 2021-05-11 DIAGNOSIS — R0602 Shortness of breath: Secondary | ICD-10-CM | POA: Diagnosis not present

## 2021-05-11 DIAGNOSIS — I130 Hypertensive heart and chronic kidney disease with heart failure and stage 1 through stage 4 chronic kidney disease, or unspecified chronic kidney disease: Secondary | ICD-10-CM | POA: Diagnosis present

## 2021-05-11 DIAGNOSIS — Z87891 Personal history of nicotine dependence: Secondary | ICD-10-CM

## 2021-05-11 DIAGNOSIS — K449 Diaphragmatic hernia without obstruction or gangrene: Secondary | ICD-10-CM | POA: Diagnosis present

## 2021-05-11 DIAGNOSIS — F419 Anxiety disorder, unspecified: Secondary | ICD-10-CM | POA: Diagnosis present

## 2021-05-11 DIAGNOSIS — Z85828 Personal history of other malignant neoplasm of skin: Secondary | ICD-10-CM

## 2021-05-11 DIAGNOSIS — Z6826 Body mass index (BMI) 26.0-26.9, adult: Secondary | ICD-10-CM | POA: Diagnosis not present

## 2021-05-11 DIAGNOSIS — Z66 Do not resuscitate: Secondary | ICD-10-CM | POA: Diagnosis not present

## 2021-05-11 DIAGNOSIS — R112 Nausea with vomiting, unspecified: Secondary | ICD-10-CM | POA: Diagnosis not present

## 2021-05-11 DIAGNOSIS — R627 Adult failure to thrive: Secondary | ICD-10-CM | POA: Diagnosis present

## 2021-05-11 DIAGNOSIS — E669 Obesity, unspecified: Secondary | ICD-10-CM | POA: Diagnosis not present

## 2021-05-11 DIAGNOSIS — E872 Acidosis: Secondary | ICD-10-CM | POA: Diagnosis present

## 2021-05-11 DIAGNOSIS — D472 Monoclonal gammopathy: Secondary | ICD-10-CM | POA: Diagnosis present

## 2021-05-11 LAB — CBC WITH DIFFERENTIAL/PLATELET
Abs Immature Granulocytes: 0.31 10*3/uL — ABNORMAL HIGH (ref 0.00–0.07)
Basophils Absolute: 0.1 10*3/uL (ref 0.0–0.1)
Basophils Absolute: 0.1 10*3/uL (ref 0.0–0.1)
Basophils Relative: 0.6 % (ref 0.0–3.0)
Basophils Relative: 1 %
Eosinophils Absolute: 0.2 10*3/uL (ref 0.0–0.7)
Eosinophils Absolute: 0.3 10*3/uL (ref 0.0–0.5)
Eosinophils Relative: 2.4 % (ref 0.0–5.0)
Eosinophils Relative: 2 %
HCT: 43.4 % (ref 39.0–52.0)
HCT: 44.3 % (ref 39.0–52.0)
Hemoglobin: 14.1 g/dL (ref 13.0–17.0)
Hemoglobin: 14.3 g/dL (ref 13.0–17.0)
Immature Granulocytes: 2 %
Lymphocytes Relative: 14.4 % (ref 12.0–46.0)
Lymphocytes Relative: 18 %
Lymphs Abs: 1.4 10*3/uL (ref 0.7–4.0)
Lymphs Abs: 2.3 10*3/uL (ref 0.7–4.0)
MCH: 30.1 pg (ref 26.0–34.0)
MCHC: 32.6 g/dL (ref 30.0–36.0)
MCHC: 32.3 g/dL (ref 30.0–36.0)
MCV: 92.6 fl (ref 78.0–100.0)
MCV: 93.3 fL (ref 80.0–100.0)
Monocytes Absolute: 1.7 10*3/uL — ABNORMAL HIGH (ref 0.1–1.0)
Monocytes Absolute: 1.9 10*3/uL — ABNORMAL HIGH (ref 0.1–1.0)
Monocytes Relative: 16.8 % — ABNORMAL HIGH (ref 3.0–12.0)
Monocytes Relative: 15 %
Neutro Abs: 6.5 10*3/uL (ref 1.4–7.7)
Neutro Abs: 7.9 10*3/uL — ABNORMAL HIGH (ref 1.7–7.7)
Neutrophils Relative %: 65.8 % (ref 43.0–77.0)
Neutrophils Relative %: 62 %
Platelets: 336 10*3/uL (ref 150.0–400.0)
Platelets: 369 10*3/uL (ref 150–400)
RBC: 4.68 Mil/uL (ref 4.22–5.81)
RBC: 4.75 MIL/uL (ref 4.22–5.81)
RDW: 15.2 % (ref 11.5–15.5)
RDW: 14.4 % (ref 11.5–15.5)
WBC: 9.9 10*3/uL (ref 4.0–10.5)
WBC: 12.8 10*3/uL — ABNORMAL HIGH (ref 4.0–10.5)
nRBC: 0 % (ref 0.0–0.2)

## 2021-05-11 LAB — COMPREHENSIVE METABOLIC PANEL
ALT: 18 U/L (ref 0–53)
ALT: 22 U/L (ref 0–44)
AST: 25 U/L (ref 0–37)
AST: 29 U/L (ref 15–41)
Albumin: 3.2 g/dL — ABNORMAL LOW (ref 3.5–5.2)
Albumin: 2.7 g/dL — ABNORMAL LOW (ref 3.5–5.0)
Alkaline Phosphatase: 33 U/L — ABNORMAL LOW (ref 39–117)
Alkaline Phosphatase: 31 U/L — ABNORMAL LOW (ref 38–126)
Anion gap: 14 (ref 5–15)
BUN: 98 mg/dL (ref 6–23)
BUN: 106 mg/dL — ABNORMAL HIGH (ref 8–23)
CO2: 18 mEq/L — ABNORMAL LOW (ref 19–32)
CO2: 16 mmol/L — ABNORMAL LOW (ref 22–32)
Calcium: 9.6 mg/dL (ref 8.4–10.5)
Calcium: 9.9 mg/dL (ref 8.9–10.3)
Chloride: 113 mEq/L — ABNORMAL HIGH (ref 96–112)
Chloride: 114 mmol/L — ABNORMAL HIGH (ref 98–111)
Creatinine, Ser: 3 mg/dL — ABNORMAL HIGH (ref 0.40–1.50)
Creatinine, Ser: 3.68 mg/dL — ABNORMAL HIGH (ref 0.61–1.24)
GFR, Estimated: 15 mL/min — ABNORMAL LOW (ref >60)
GFR: 18.19 mL/min — ABNORMAL LOW (ref >60.00)
Glucose, Bld: 96 mg/dL (ref 70–99)
Glucose, Bld: 113 mg/dL — ABNORMAL HIGH (ref 70–99)
Potassium: 4.7 mEq/L (ref 3.5–5.1)
Potassium: 4.4 mmol/L (ref 3.5–5.1)
Sodium: 147 mEq/L — ABNORMAL HIGH (ref 135–145)
Sodium: 144 mmol/L (ref 135–145)
Total Bilirubin: 0.7 mg/dL (ref 0.2–1.2)
Total Bilirubin: 0.6 mg/dL (ref 0.3–1.2)
Total Protein: 6.2 g/dL (ref 6.0–8.3)
Total Protein: 6.6 g/dL (ref 6.5–8.1)

## 2021-05-11 LAB — POCT URINALYSIS DIPSTICK
Bilirubin, UA: NEGATIVE
Blood, UA: NEGATIVE
Glucose, UA: NEGATIVE
Ketones, UA: NEGATIVE
Leukocytes, UA: NEGATIVE
Nitrite, UA: NEGATIVE
Protein, UA: POSITIVE — AB
Spec Grav, UA: 1.025 (ref 1.010–1.025)
Urobilinogen, UA: 0.2 E.U./dL
pH, UA: 5.5 (ref 5.0–8.0)

## 2021-05-11 MED ORDER — ACETAMINOPHEN 650 MG RE SUPP: 650.0000 mg | Freq: Four times a day (QID) | RECTAL | Status: DC | PRN

## 2021-05-11 MED ORDER — FINASTERIDE 5 MG PO TABS
5.0000 mg | ORAL_TABLET | Freq: Every day | ORAL | Status: DC
  Administered 2021-05-12 – 2021-05-14 (×3): 5 mg via ORAL
  Filled 2021-05-11 (×3): qty 1

## 2021-05-11 MED ORDER — SODIUM CHLORIDE 0.9% FLUSH
3.0000 mL | Freq: Two times a day (BID) | INTRAVENOUS | Status: DC
  Administered 2021-05-12 – 2021-05-15 (×6): 3 mL via INTRAVENOUS

## 2021-05-11 MED ORDER — PROSIGHT PO TABS
1.0000 | ORAL_TABLET | Freq: Every day | ORAL | Status: DC
  Administered 2021-05-13: 1 via ORAL
  Filled 2021-05-11 (×4): qty 1

## 2021-05-11 MED ORDER — PRESERVISION AREDS 2 PO CAPS: 1.0000 | ORAL_CAPSULE | Freq: Two times a day (BID) | ORAL | Status: DC

## 2021-05-11 MED ORDER — ENOXAPARIN SODIUM 30 MG/0.3ML IJ SOSY
30.0000 mg | PREFILLED_SYRINGE | INTRAMUSCULAR | Status: DC
  Administered 2021-05-11 – 2021-05-12 (×2): 30 mg via SUBCUTANEOUS
  Filled 2021-05-11 (×3): qty 0.3

## 2021-05-11 MED ORDER — SODIUM CHLORIDE 0.9 % IV SOLN
2.0000 g | INTRAVENOUS | Status: DC
  Administered 2021-05-12 – 2021-05-13 (×2): 2 g via INTRAVENOUS
  Filled 2021-05-11: qty 2

## 2021-05-11 MED ORDER — AMLODIPINE BESYLATE 5 MG PO TABS
5.0000 mg | ORAL_TABLET | Freq: Every day | ORAL | Status: DC
  Administered 2021-05-12: 5 mg via ORAL
  Filled 2021-05-11: qty 1

## 2021-05-11 MED ORDER — ALBUTEROL SULFATE HFA 108 (90 BASE) MCG/ACT IN AERS
2.0000 | INHALATION_SPRAY | Freq: Four times a day (QID) | RESPIRATORY_TRACT | Status: DC | PRN
  Filled 2021-05-11: qty 6.7

## 2021-05-11 MED ORDER — SODIUM CHLORIDE 0.9 % IV SOLN: INTRAVENOUS | Status: DC

## 2021-05-11 MED ORDER — ACETAMINOPHEN 325 MG PO TABS: 650.0000 mg | ORAL_TABLET | Freq: Four times a day (QID) | ORAL | Status: DC | PRN

## 2021-05-11 MED ORDER — MIRTAZAPINE 30 MG PO TABS
30.0000 mg | ORAL_TABLET | Freq: Every day | ORAL | Status: DC
  Administered 2021-05-11: 30 mg via ORAL
  Filled 2021-05-11: qty 1

## 2021-05-11 MED ORDER — SODIUM CHLORIDE 0.9 % IV BOLUS
1000.0000 mL | Freq: Once | INTRAVENOUS | Status: AC
  Administered 2021-05-11: 1000 mL via INTRAVENOUS

## 2021-05-11 MED ORDER — SODIUM CHLORIDE 0.9 % IV SOLN
2.0000 g | Freq: Once | INTRAVENOUS | Status: AC
  Administered 2021-05-11: 2 g via INTRAVENOUS
  Filled 2021-05-11: qty 2

## 2021-05-11 MED ORDER — ASPIRIN 81 MG PO CHEW
81.0000 mg | CHEWABLE_TABLET | Freq: Every day | ORAL | Status: DC
  Administered 2021-05-12 – 2021-05-13 (×2): 81 mg via ORAL
  Filled 2021-05-11 (×3): qty 1

## 2021-05-11 MED ORDER — ATENOLOL 25 MG PO TABS
25.0000 mg | ORAL_TABLET | Freq: Every day | ORAL | Status: DC
  Administered 2021-05-12: 25 mg via ORAL
  Filled 2021-05-11: qty 1

## 2021-05-11 MED ORDER — SIMVASTATIN 20 MG PO TABS
10.0000 mg | ORAL_TABLET | Freq: Every day | ORAL | Status: DC
  Administered 2021-05-12 – 2021-05-13 (×2): 10 mg via ORAL
  Filled 2021-05-11 (×2): qty 1

## 2021-05-11 MED ORDER — ISOSORBIDE MONONITRATE ER 30 MG PO TB24
15.0000 mg | ORAL_TABLET | Freq: Every day | ORAL | Status: DC
  Administered 2021-05-12: 15 mg via ORAL
  Filled 2021-05-11: qty 1

## 2021-05-11 MED ORDER — POLYETHYLENE GLYCOL 3350 17 G PO PACK: 17.0000 g | PACK | Freq: Every day | ORAL | Status: DC | PRN

## 2021-05-11 MED ORDER — OCUVITE-LUTEIN PO CAPS
1.0000 | ORAL_CAPSULE | Freq: Every day | ORAL | Status: DC
  Filled 2021-05-11: qty 1

## 2021-05-11 NOTE — ED Provider Notes (Signed)
[facility] EMERGENCY DEPARTMENT Provider Note   CSN: [id number] Arrival date & time: 05/11/21  1349     History CC:  Cough, weakness   Shane [name] is a 87 y.o. male presenting to the hospital with concern for failure to thrive and worsening cough at home.  His daughter provides supplemental history.  She reports the patient was just discharged from the hospital on 05/03/21.  Per medical record review, at that point he had been treated for acute hypoxic respiratory failure which was felt to be related to pneumonia as well as acute on chronic congestive diastolic heart failure.  He was placed on antibiotics and diuresed with IV Lasix.  His daughter reports since returning home, they are having difficulty getting to eat or drink anything.  The patient himself says he will not drink anything because "it just taste bad".  The daughter denies that he has dementia and says normally is very lucid.  She reports he has been increasingly weakening, losing weight, and has not been able to walk.  She also reports that he has had subjective chills the past couple days, shaking of the arms, and worsening cough.  Medical record review was baseline creatinine is around 2.0.  His outpatient labs have shown worsening creatinine.  He was diagnosed with Covid 19 in April 14 2021.  His last echocardiogram was 10 days ago on August 2, showing an EF of 55 to 60% with grade 1 diastolic dysfunction.  HPI     Past Medical History:  Diagnosis Date   Anemia, iron deficiency    C. difficile diarrhea    Cancer (HCC)    forearm-  squamous   Cataract    bil cataracts removed   DDD (degenerative disc disease), cervical    Dysplastic nevus of left upper extremity    Elevated PSA    Essential hypertension    Extrinsic asthma    GERD (gastroesophageal reflux disease)    no recent   Gout    Hiatal hernia    Macular degeneration    Spinal stenosis     Patient Active Problem List   Diagnosis  Date Noted   Acute renal failure superimposed on stage 3b chronic kidney disease ([place]) 05/11/2021   BPH (benign prostatic hyperplasia) 05/11/2021   HCAP (healthcare-associated pneumonia) 05/11/2021   Physical deconditioning 05/11/2021   Chronic diastolic CHF (congestive heart failure) ([place]) 05/11/2021   Hypoxia 04/30/2021   Pneumonia due to COVID-19 virus    Elevated troponin I level    AKI (acute kidney injury) ([place])    MGUS (monoclonal gammopathy of unknown significance) 11/16/2020   Advanced nonexudative age-related macular degeneration of left eye with subfoveal involvement 06/19/2020   Left epiretinal membrane 06/19/2020   Advanced nonexudative age-related macular degeneration of right eye with subfoveal involvement 06/19/2020   Exudative age-related macular degeneration of right eye with inactive choroidal neovascularization ([place]) 06/19/2020   Chronic kidney disease (CKD), stage III (moderate) ([place]) 06/16/2019   Clostridium difficile infection    Hypertriglyceridemia 12/01/2015   EXTRINSIC ASTHMA, UNSPECIFIED 01/11/2009   SPINAL STENOSIS 05/24/2008   PROSTATE SPECIFIC ANTIGEN, ELEVATED 05/24/2008   OBESITY 08/13/2006   ANEMIA-IRON DEFICIENCY 08/13/2006   Essential hypertension 08/13/2006   ALLERGIC RHINITIS 08/13/2006   GERD 08/13/2006   HIATAL HERNIA 08/13/2006   DEGENERATIVE JOINT DISEASE 08/13/2006    Past Surgical History:  Procedure Laterality Date   BACK SURGERY     Bone Spur Right    Shoulder   COLONOSCOPY  COLONOSCOPY W/ POLYPECTOMY     COLONOSCOPY WITH PROPOFOL N/A 11/19/2016   Procedure: COLONOSCOPY WITH PROPOFOL;  Surgeon: Doran Stabler, MD;  Location: [place];  Service: Gastroenterology;  Laterality: N/A;   FECAL TRANSPLANT N/A 11/19/2016   Procedure: FECAL TRANSPLANT;  Surgeon: Doran Stabler, MD;  Location: [place];  Service: Gastroenterology;  Laterality: N/A;   MOUTH SURGERY     2 teeth removed resulting in a dry socket in one of them    POLYPECTOMY     SKIN CANCER EXCISION         Family History  Problem Relation Age of Onset   Multiple myeloma Brother    Prostate cancer Brother    Hypertension Father        ?   Lung cancer Sister    Colon cancer Neg Hx    Stomach cancer Neg Hx    Esophageal cancer Neg Hx    Rectal cancer Neg Hx     Social History   Tobacco Use   Smoking status: Former    Years: 35.00    Types: Cigarettes   Smokeless tobacco: Never   Tobacco comments:    quit late 1980's  Vaping Use   Vaping Use: Never used  Substance Use Topics   Alcohol use: No   Drug use: No    Home Medications Prior to Admission medications   Medication Sig Start Date End Date Taking? Authorizing Provider  acetaminophen (TYLENOL) 500 MG tablet Take 500-1,000 mg by mouth every 6 (six) hours as needed (for pain).   Yes [provider]  albuterol (VENTOLIN HFA) 108 (90 Base) MCG/ACT inhaler Inhale 2 puffs into the lungs every 6 (six) hours as needed for wheezing or shortness of breath. 04/25/21 04/25/22 Yes Panosh, Standley Brooking, MD  allopurinol (ZYLOPRIM) 300 MG tablet Take 1 tablet (300 mg total) by mouth daily. 10/11/20  Yes Nafziger, Tommi Rumps, NP  amLODipine (NORVASC) 5 MG tablet Take 1 tablet (5 mg total) by mouth daily. 05/03/21 05/03/22 Yes Thurnell Lose, MD  aspirin 81 MG chewable tablet Chew 1 tablet (81 mg total) by mouth daily. 05/04/21  Yes Thurnell Lose, MD  atenolol (TENORMIN) 25 MG tablet Take 1 tablet (25 mg total) by mouth daily. 10/11/20  Yes Nafziger, Tommi Rumps, NP  chlorthalidone (HYGROTON) 25 MG tablet Take 0.5 tablets (12.5 mg total) by mouth daily. 10/11/20  Yes Nafziger, Tommi Rumps, NP  finasteride (PROSCAR) 5 MG tablet Take 1 tablet (5 mg total) by mouth daily. 10/11/20  Yes Nafziger, Tommi Rumps, NP  furosemide (LASIX) 40 MG tablet Take 0.5 tablets (20 mg total) by mouth daily as needed for fluid or edema (from baseline50fom baseline). 05/03/21  Yes SThurnell Lose MD  isosorbide mononitrate (IMDUR) 30 MG 24 hr  tablet Take 0.5 tablets (15 mg total) by mouth daily. 05/04/21  Yes SThurnell Lose MD  mirtazapine (REMERON) 30 MG tablet Take 1 tablet (30 mg total) by mouth at bedtime. 05/10/21  Yes Nafziger, CTommi Rumps NP  Multiple Vitamins-Minerals (PRESERVISION AREDS 2) CAPS Take 1 capsule by mouth 2 (two) times daily.   Yes [provider]  potassium chloride (KLOR-CON) 10 MEQ tablet Take 1 tablet (10 mEq total) by mouth daily as needed (Take only if you are taking Lasix that day). 05/03/21  Yes SThurnell Lose MD  simvastatin (ZOCOR) 10 MG tablet Take 1 tablet (10 mg total) by mouth at bedtime. 10/10/20  Yes Nafziger, CTommi Rumps NP  benzonatate (TESSALON PERLES) 100 MG  capsule Take 1 capsule (100 mg total) by mouth 3 (three) times daily as needed for cough. Patient not taking: Reported on 05/11/2021 04/25/21   Panosh, Standley Brooking, MD  budesonide (ENTOCORT EC) 3 MG 24 hr capsule Take 3 capsules (9 mg total) by mouth daily. 11/27/20   Doran Stabler, MD    Allergies    Patient has no known allergies.  Review of Systems   Review of Systems  Constitutional:  Positive for appetite change, chills, fatigue and unexpected weight change. Negative for fever.  HENT:  Negative for ear pain and sore throat.   Eyes:  Negative for pain and visual disturbance.  Respiratory:  Positive for cough and shortness of breath.   Cardiovascular:  Negative for chest pain and palpitations.  Gastrointestinal:  Positive for nausea. Negative for abdominal pain and vomiting.  Genitourinary:  Negative for dysuria and hematuria.  Musculoskeletal:  Negative for arthralgias and back pain.  Skin:  Negative for color change and rash.  Neurological:  Negative for syncope and numbness.  All other systems reviewed and are negative.  Physical Exam Updated Vital Signs BP (!) 142/76   Pulse 62   Temp 98 F (36.7 C) (Oral)   Resp 16   SpO2 96%   Physical Exam Constitutional:      General: He is not in acute distress.    Comments:  Thin, appears tired  HENT:     Head: Normocephalic and atraumatic.  Eyes:     Conjunctiva/sclera: Conjunctivae normal.     Pupils: Pupils are equal, round, and reactive to light.  Cardiovascular:     Rate and Rhythm: Normal rate and regular rhythm.  Pulmonary:     Effort: Pulmonary effort is normal. No respiratory distress.     Comments: Rhonchi bilaterally Abdominal:     General: There is no distension.     Tenderness: There is no abdominal tenderness.  Skin:    General: Skin is warm and dry.  Neurological:     General: No focal deficit present.     Mental Status: He is alert. Mental status is at baseline.  Psychiatric:        Mood and Affect: Mood normal.        Behavior: Behavior normal.    ED Results / Procedures / Treatments   Labs (all labs ordered are listed, but only abnormal results are displayed) Labs Reviewed  CBC WITH DIFFERENTIAL/PLATELET - Abnormal; Notable for the following components:      Result Value   WBC 12.8 (*)    Neutro Abs 7.9 (*)    Monocytes Absolute 1.9 (*)    Abs Immature Granulocytes 0.31 (*)    All other components within normal limits  COMPREHENSIVE METABOLIC PANEL - Abnormal; Notable for the following components:   Chloride 114 (*)    CO2 16 (*)    Glucose, Bld 113 (*)    BUN 106 (*)    Creatinine, Ser 3.68 (*)    Albumin 2.7 (*)    Alkaline Phosphatase 31 (*)    GFR, Estimated 15 (*)    All other components within normal limits  URINALYSIS, ROUTINE W REFLEX MICROSCOPIC - Abnormal; Notable for the following components:   APPearance HAZY (*)    Protein, ur 30 (*)    All other components within normal limits  COMPREHENSIVE METABOLIC PANEL - Abnormal; Notable for the following components:   Sodium 146 (*)    Chloride 120 (*)    CO2 12 (*)  BUN 100 (*)    Creatinine, Ser 3.57 (*)    Total Protein 5.9 (*)    Albumin 2.4 (*)    Alkaline Phosphatase 26 (*)    GFR, Estimated 16 (*)    All other components within normal limits  CBC   MAGNESIUM  PROCALCITONIN    EKG None  Radiology DG Chest 2 View  Result Date: 05/11/2021 CLINICAL DATA:  Follow-up pneumonia. Cough and congestion. Shortness of breath. EXAM: CHEST - 2 VIEW COMPARISON:  Most recent radiograph 05/02/2021.  CT 04/30/2021 FINDINGS: Slight improvement in patchy left greater than right basilar opacities, mild to moderate residual. There is a new streaky opacity in the right suprahilar lung. Suspect trace left pleural effusion. Stable heart size and mediastinal contours with aortic atherosclerosis. No pulmonary edema or pneumothorax. No acute osseous abnormalities are seen. IMPRESSION: 1. New streaky right suprahilar opacity, atelectasis versus pneumonia in the setting of COVID. 2. Slight improvement in left greater than right basilar opacities, mild to moderate residual. Suspect trace left pleural effusion. Electronically Signed   By: Keith Rake M.D.   On: 05/11/2021 15:15   DG Chest 2 View  Result Date: 05/11/2021 CLINICAL DATA:  Shortness of breath.  COVID. EXAM: CHEST - 2 VIEW COMPARISON:  Radiograph yesterday. Additional priors reviewed. Chest CT 04/30/2021 FINDINGS: Persistent patchy opacity at the left greater than right lung base. Minimal vague streaky opacity in the right suprahilar lung. Stable heart size and mediastinal contours. Aortic atherosclerosis. There is a small left pleural effusion. No pulmonary edema. No pneumothorax. Thoracic spondylosis. Widening of the right acromioclavicular joint may be postsurgical. IMPRESSION: Unchanged patchy opacity at the left greater than right lung base. Minimal vague streaky opacity in the right suprahilar lung. Small left pleural effusion. Electronically Signed   By: Keith Rake M.D.   On: 05/11/2021 15:14    Procedures Procedures   Medications Ordered in ED Medications  aspirin chewable tablet 81 mg (has no administration in time range)  amLODipine (NORVASC) tablet 5 mg (has no administration in time  range)  atenolol (TENORMIN) tablet 25 mg (has no administration in time range)  isosorbide mononitrate (IMDUR) 24 hr tablet 15 mg (has no administration in time range)  simvastatin (ZOCOR) tablet 10 mg (has no administration in time range)  mirtazapine (REMERON) tablet 30 mg (30 mg Oral Given 05/11/21 2102)  finasteride (PROSCAR) tablet 5 mg (has no administration in time range)  albuterol (VENTOLIN HFA) 108 (90 Base) MCG/ACT inhaler 2 puff (has no administration in time range)  enoxaparin (LOVENOX) injection 30 mg (30 mg Subcutaneous Given 05/11/21 1922)  sodium chloride flush (NS) 0.9 % injection 3 mL (3 mLs Intravenous Not Given 05/11/21 2100)  0.9 %  sodium chloride infusion ( Intravenous Stopped 05/12/21 0656)  acetaminophen (TYLENOL) tablet 650 mg (has no administration in time range)    Or  acetaminophen (TYLENOL) suppository 650 mg (has no administration in time range)  polyethylene glycol (MIRALAX / GLYCOLAX) packet 17 g (has no administration in time range)  multivitamin (PROSIGHT) tablet 1 tablet (1 tablet Oral Not Given 05/11/21 1913)  ceFEPIme (MAXIPIME) 2 g in sodium chloride 0.9 % 100 mL IVPB (has no administration in time range)  sodium chloride 0.9 % bolus 1,000 mL (0 mLs Intravenous Stopped 05/11/21 1924)  ceFEPIme (MAXIPIME) 2 g in sodium chloride 0.9 % 100 mL IVPB (0 g Intravenous Stopped 05/11/21 1825)  haloperidol (HALDOL) tablet 2 mg ( Oral See Alternative 05/12/21 0223)    Or  haloperidol  lactate (HALDOL) injection 2 mg (2 mg Intramuscular Given 05/12/21 9718)    ED Course  I have reviewed the triage vital signs and the nursing notes.  Pertinent labs & imaging results that were available during my care of the patient were reviewed by me and considered in my medical decision making (see chart for details).  Patient presenting with suspected failure to thrive in the setting of Covid-19 illness last month.  He is not drinking water at home and has no appetite, despite his  family's close and constant encouragement.  Cr trending up per chart review, and BUN > 100, concerning for worsening kidney injury.  Alb low at 2.7.  He will need IV fluids for likely prerenal kidney injury.  WBC elevated - he is not on steroids.  DG chest suggestive of possible ongoing PNA.  He continues coughing at home - possible aspiration (?) vs untreated PNA?  IV antibiotics ordered.  Supplemental hx provided by daughter at bedside Prior medical records reviewed ECG here unchanged from prior tracing, lower suspicion for ACS  Discussed admission with patient and his daughter for IV fluids and antibiotics.  They are in agreement.  Clinical Course as of 05/12/21 0950  Fri May 11, 2021  1731 BUN(!): 106 [MT]  1731 Creatinine(!): 3.68 [MT]  1731 WBC(!): 12.8 [MT]  1823 Admitted to hospitalist. [MT]    Clinical Course User Index [MT] Wyvonnia Dusky, MD    Final Clinical Impression(s) / ED Diagnoses Final diagnoses:  Dehydration  Healthcare-associated pneumonia    Rx / DC Orders ED Discharge Orders     None        Wyvonnia Dusky, MD 05/12/21 [id number]

## 2021-05-11 NOTE — H&P (Signed)
History and Physical   JEANNE TERRANCE ZHG:[id number] DOB: 07/08/1932 DOA: 05/11/2021  PCP: Dorothyann Peng, NP   Patient coming from: Home  Chief Complaint: Weakness, fever, cough, nausea, vomiting  HPI: Shane [name] is a 87 y.o. male with medical history significant of macular degeneration, iron deficiency anemia, CKD 3B, hypertension, C. difficile, asthma, GERD, hyperlipidemia, MGUS, spinal stenosis, squamous cell skin cancer, gout, diastolic heart failure, BPH who presents with a constellation of symptoms including nausea, vomiting, cough, chills with decreased p.o. intake and weakness. History obtained with assistance of family and chart review.  Patient recently admitted in August for respiratory failure due to a combination of community-acquired pneumonia and diastolic CHF.  He was diuresed and treated with antibiotics and discharged on 2 L of home oxygen and p.o. antibiotics to finish course.  He was noted to be incidentally COVID-positive during that admission and this was likely residual COVID positive from when he had tested positive on July 16. Since returning home family reports he has not been doing very well he has had significant weakness and decreased p.o. intake and he states this is because food and drink tasted bad and just not having any appetite.  He also has been reporting cough and chills.  And some nausea and vomiting.  He went to see PCP yesterday and there was some concern for his respiratory status and his overall decline and evaluation was started for possible recurrent pneumonia.  No leukocytosis at that visit but did have new a streaky opacity that was not seen during previous admission at the right suprahilar lung field. He continue to do poorly and was sent to the ED for further evaluation. He denies chest pain, shortness of breath, abdominal pain, constipation.  ED Course: Vital signs in the ED were stable with temperature of 97.4.  Lab work-up showed CMP with chloride  of 114, bicarb 16 which is down from previous in the 18-20 range, BUN elevated to 106 and creatinine of 3.68 up from baseline of 2-2.5.  Albumin noted to be 2.7.  CBC with leukocytosis to 12.8 which is new from yesterday when it was checked at his office visit and was normal.  Urinalysis at the office visit showed protein only and urinalysis here is pending.  Chest x-ray here showed stable patchy opacities left greater than right at the base similar to previous admission but there is a new streaky opacity at the right suprahilar lung region.  Also a stable left small pleural effusion.  Patient received 1 L of IV fluids in the ED and was started on cefepime.  Review of Systems: As per HPI otherwise all other systems reviewed and are negative.  Past Medical History:  Diagnosis Date   Anemia, iron deficiency    C. difficile diarrhea    Cancer (HCC)    forearm-  squamous   Cataract    bil cataracts removed   DDD (degenerative disc disease), cervical    Dysplastic nevus of left upper extremity    Elevated PSA    Essential hypertension    Extrinsic asthma    GERD (gastroesophageal reflux disease)    no recent   Gout    Hiatal hernia    Macular degeneration    Spinal stenosis     Past Surgical History:  Procedure Laterality Date   BACK SURGERY     Bone Spur Right    Shoulder   COLONOSCOPY     COLONOSCOPY W/ POLYPECTOMY     COLONOSCOPY WITH  PROPOFOL N/A 11/19/2016   Procedure: COLONOSCOPY WITH PROPOFOL;  Surgeon: Doran Stabler, MD;  Location: [facility];  Service: Gastroenterology;  Laterality: N/A;   FECAL TRANSPLANT N/A 11/19/2016   Procedure: FECAL TRANSPLANT;  Surgeon: Doran Stabler, MD;  Location: [facility];  Service: Gastroenterology;  Laterality: N/A;   MOUTH SURGERY     2 teeth removed resulting in a dry socket in one of them   POLYPECTOMY     SKIN CANCER EXCISION      Social History  reports that he has quit smoking. His smoking use included cigarettes. He has  never used smokeless tobacco. He reports that he does not drink alcohol and does not use drugs.  No Known Allergies  Family History  Problem Relation Age of Onset   Multiple myeloma Brother    Prostate cancer Brother    Hypertension Father        ?   Lung cancer Sister    Colon cancer Neg Hx    Stomach cancer Neg Hx    Esophageal cancer Neg Hx    Rectal cancer Neg Hx   Reviewed on admission  Prior to Admission medications   Medication Sig Start Date End Date Taking? Authorizing Provider  acetaminophen (TYLENOL) 500 MG tablet Take 500-1,000 mg by mouth every 6 (six) hours as needed (for pain).   Yes [provider]  albuterol (VENTOLIN HFA) 108 (90 Base) MCG/ACT inhaler Inhale 2 puffs into the lungs every 6 (six) hours as needed for wheezing or shortness of breath. 04/25/21 04/25/22 Yes Panosh, Standley Brooking, MD  allopurinol (ZYLOPRIM) 300 MG tablet Take 1 tablet (300 mg total) by mouth daily. 10/11/20  Yes Nafziger, Tommi Rumps, NP  amLODipine (NORVASC) 5 MG tablet Take 1 tablet (5 mg total) by mouth daily. 05/03/21 05/03/22 Yes Thurnell Lose, MD  aspirin 81 MG chewable tablet Chew 1 tablet (81 mg total) by mouth daily. 05/04/21  Yes Thurnell Lose, MD  atenolol (TENORMIN) 25 MG tablet Take 1 tablet (25 mg total) by mouth daily. 10/11/20  Yes Nafziger, Tommi Rumps, NP  chlorthalidone (HYGROTON) 25 MG tablet Take 0.5 tablets (12.5 mg total) by mouth daily. 10/11/20  Yes Nafziger, Tommi Rumps, NP  finasteride (PROSCAR) 5 MG tablet Take 1 tablet (5 mg total) by mouth daily. 10/11/20  Yes Nafziger, Tommi Rumps, NP  furosemide (LASIX) 40 MG tablet Take 0.5 tablets (20 mg total) by mouth daily as needed for fluid or edema (from baseline75fom baseline). 05/03/21  Yes SThurnell Lose MD  isosorbide mononitrate (IMDUR) 30 MG 24 hr tablet Take 0.5 tablets (15 mg total) by mouth daily. 05/04/21  Yes SThurnell Lose MD  mirtazapine (REMERON) 30 MG tablet Take 1 tablet (30 mg total) by mouth at bedtime. 05/10/21  Yes Nafziger,  CTommi Rumps NP  Multiple Vitamins-Minerals (PRESERVISION AREDS 2) CAPS Take 1 capsule by mouth 2 (two) times daily.   Yes [provider]  potassium chloride (KLOR-CON) 10 MEQ tablet Take 1 tablet (10 mEq total) by mouth daily as needed (Take only if you are taking Lasix that day). 05/03/21  Yes SThurnell Lose MD  simvastatin (ZOCOR) 10 MG tablet Take 1 tablet (10 mg total) by mouth at bedtime. 10/10/20  Yes Nafziger, CTommi Rumps NP  benzonatate (TESSALON PERLES) 100 MG capsule Take 1 capsule (100 mg total) by mouth 3 (three) times daily as needed for cough. Patient not taking: Reported on 05/11/2021 04/25/21   Panosh, WStandley Brooking MD  budesonide (ENTOCORT EC) 3 MG  24 hr capsule Take 3 capsules (9 mg total) by mouth daily. 11/27/20   Doran Stabler, MD    Physical Exam: Vitals:   05/11/21 1654 05/11/21 1700 05/11/21 1815 05/11/21 1845  BP:  105/63 132/75 125/70  Pulse:  86 89 89  Resp:  18 (!) 22 20  Temp: (!) 97.4 F (36.3 C)     TempSrc: Axillary     SpO2:  92% 94% 94%   Physical Exam Constitutional:      General: He is not in acute distress.    Appearance: He is ill-appearing.     Comments: Shivering elderly male  HENT:     Head: Normocephalic and atraumatic.     Mouth/Throat:     Mouth: Mucous membranes are moist.     Pharynx: Oropharynx is clear.  Eyes:     Extraocular Movements: Extraocular movements intact.     Pupils: Pupils are equal, round, and reactive to light.  Cardiovascular:     Rate and Rhythm: Normal rate and regular rhythm.     Pulses: Normal pulses.     Heart sounds: Normal heart sounds.  Pulmonary:     Effort: Pulmonary effort is normal. No respiratory distress.     Breath sounds: Rhonchi present.  Abdominal:     General: Bowel sounds are normal. There is no distension.     Palpations: Abdomen is soft.     Tenderness: There is no abdominal tenderness.  Musculoskeletal:        General: No swelling or deformity.  Skin:    General: Skin is warm and dry.   Neurological:     General: No focal deficit present.     Mental Status: Mental status is at baseline.    Labs on Admission: I have personally reviewed following labs and imaging studies  CBC: Recent Labs  Lab 05/10/21 1628 05/11/21 1415  WBC 9.9 12.8*  NEUTROABS 6.5 7.9*  HGB 14.1 14.3  HCT 43.4 44.3  MCV 92.6 93.3  PLT 336.0 017    Basic Metabolic Panel: Recent Labs  Lab 05/10/21 1628 05/11/21 1415  NA 147* 144  K 4.7 4.4  CL 113* 114*  CO2 18* 16*  GLUCOSE 96 113*  BUN 98* 106*  CREATININE 3.00* 3.68*  CALCIUM 9.6 9.9    GFR: Estimated Creatinine Clearance: 14.4 mL/min (A) (by C-G formula based on SCr of 3.68 mg/dL (H)).  Liver Function Tests: Recent Labs  Lab 05/10/21 1628 05/11/21 1415  AST 25 29  ALT 18 22  ALKPHOS 33* 31*  BILITOT 0.7 0.6  PROT 6.2 6.6  ALBUMIN 3.2* 2.7*    Urine analysis:    Component Value Date/Time   COLORURINE AMBER (A) 05/02/2021 0938   APPEARANCEUR CLOUDY (A) 05/02/2021 0938   LABSPEC 1.020 05/02/2021 0938   PHURINE 5.0 05/02/2021 0938   GLUCOSEU NEGATIVE 05/02/2021 0938   HGBUR LARGE (A) 05/02/2021 0938   HGBUR negative 08/17/2010 0909   BILIRUBINUR neg 05/11/2021 1008   KETONESUR NEGATIVE 05/02/2021 0938   PROTEINUR Positive (A) 05/11/2021 1008   PROTEINUR 100 (A) 05/02/2021 0938   UROBILINOGEN 0.2 05/11/2021 1008   UROBILINOGEN 0.2 08/17/2010 0909   NITRITE neg 05/11/2021 1008   NITRITE NEGATIVE 05/02/2021 0938   LEUKOCYTESUR Negative 05/11/2021 1008   LEUKOCYTESUR NEGATIVE 05/02/2021 0938    Radiological Exams on Admission: DG Chest 2 View  Result Date: 05/11/2021 CLINICAL DATA:  Follow-up pneumonia. Cough and congestion. Shortness of breath. EXAM: CHEST - 2 VIEW COMPARISON:  Most recent radiograph 05/02/2021.  CT 04/30/2021 FINDINGS: Slight improvement in patchy left greater than right basilar opacities, mild to moderate residual. There is a new streaky opacity in the right suprahilar lung. Suspect trace  left pleural effusion. Stable heart size and mediastinal contours with aortic atherosclerosis. No pulmonary edema or pneumothorax. No acute osseous abnormalities are seen. IMPRESSION: 1. New streaky right suprahilar opacity, atelectasis versus pneumonia in the setting of COVID. 2. Slight improvement in left greater than right basilar opacities, mild to moderate residual. Suspect trace left pleural effusion. Electronically Signed   By: Keith Rake M.D.   On: 05/11/2021 15:15   DG Chest 2 View  Result Date: 05/11/2021 CLINICAL DATA:  Shortness of breath.  COVID. EXAM: CHEST - 2 VIEW COMPARISON:  Radiograph yesterday. Additional priors reviewed. Chest CT 04/30/2021 FINDINGS: Persistent patchy opacity at the left greater than right lung base. Minimal vague streaky opacity in the right suprahilar lung. Stable heart size and mediastinal contours. Aortic atherosclerosis. There is a small left pleural effusion. No pulmonary edema. No pneumothorax. Thoracic spondylosis. Widening of the right acromioclavicular joint may be postsurgical. IMPRESSION: Unchanged patchy opacity at the left greater than right lung base. Minimal vague streaky opacity in the right suprahilar lung. Small left pleural effusion. Electronically Signed   By: Keith Rake M.D.   On: 05/11/2021 15:14    EKG: Not yet performed  Assessment/Plan Principal Problem:   Acute renal failure superimposed on stage 3b chronic kidney disease ([place]) Active Problems:   Essential hypertension   Hypertriglyceridemia   BPH (benign prostatic hyperplasia)   HCAP (healthcare-associated pneumonia)   Physical deconditioning   Chronic diastolic CHF (congestive heart failure) (HCC)  AKI on CKD 3B > Presenting with some general decline after recent admission and decreased p.o. intake.  Found to have creatinine of 3.68 up from baseline of around 2-2.5. > Received 1 L of IV fluids in the ED.  > Electrolytes are stable BUN is elevated at 106. - Continue  with IV fluids - Check magnesium - Avoid nephrotoxic agents - Trend renal function and electrolytes  HCAP? > Concern for recurrent pneumonia given new leukocytosis and prior negative urine studies with new streaky opacity on chest x-ray. > Started on cefepime in the ED for HCAP coverage considering recently completed course of antibiotics while hospitalized. - Continue cefepime for now - Trend fever curve and white count - Follow-up urinalysis - ProCalcitonin  Deconditioning Weakness > Patient reportedly significantly weaker and deconditioned in setting of recent hospitalization and decreased p.o. intake at home. > Reports he has decreased p.o. intake due to food and drink tasting bad > No thrush noted on oral exam  - PT and OT eval and treat - Dietitian consult  COVID-19 positive > Originally tested positive for COVID-19 on July 16.  He was found to have incidental positive likely residual from this prior infection on 8/3.  Given he is in the 21 to 90-day window he does not need to be retested.  Iron deficiency anemia -Hemoglobin stable, continue to trend  BPH - Continue home finasteride  Hypertension > BP stable in the ED - Continue home atenolol, amlodipine, Imdur - Hold home chlorthalidone and Lasix in the setting of AKI as above  Diastolic heart failure > Evaluated for this with his recent respiratory failure admission.  EF at that time 55-60% with grade 1 diastolic dysfunction. - Holding home Lasix as above. - Continue home Imdur  Hyperlipidemia - Continue home statin  Asthma - Continue  home albuterol  Gout - Hold home allopurinol in setting of AKI  DVT prophylaxis: Lovenox  Code Status:   Full  Family Communication:  Daughter updated at bedside Disposition Plan:   Patient is from:  Home  Anticipated DC to:  Pending clinical course  Anticipated DC date:  1 to 4 days  Anticipated DC barriers: None  Consults called:  None  Admission status:  Observation,  telemetry  Severity of Illness: The appropriate patient status for this patient is OBSERVATION. Observation status is judged to be reasonable and necessary in order to provide the required intensity of service to ensure the patient's safety. The patient's presenting symptoms, physical exam findings, and initial radiographic and laboratory data in the context of their medical condition is felt to place them at decreased risk for further clinical deterioration. Furthermore, it is anticipated that the patient will be medically stable for discharge from the hospital within 2 midnights of admission. The following factors support the patient status of observation.   " The patient's presenting symptoms include constellation of symptoms including nausea, vomiting, cough, fever, chills, weakness, decreased p.o. intake. " The physical exam findings include minimal rhonchi, shivering, weak/ill appearing. " The initial radiographic and laboratory data are Lab work-up showed CMP with chloride of 114, bicarb 16 which is down from previous in the 18-20 range, BUN elevated to 106 and creatinine of 3.68 up from baseline of 2-2.5.  Albumin noted to be 2.7.  CBC with leukocytosis to 12.8 which is new from yesterday when it was checked at his office visit and was normal.  Urinalysis at the office visit showed protein only and urinalysis here is pending.  Chest x-ray here showed stable patchy opacities left greater than right at the base similar to previous admission but there is a new streaky opacity at the right suprahilar lung region.  Also a stable left small pleural effusion.    Marcelyn Bruins MD [facility]  How to contact the [facility] Attending or Consulting provider [place] or covering provider during after hours [place], for this patient?   Check the care team in [facility] and look for a) attending/consulting [facility] provider listed and b) the [facility] team listed Log into [email or web address] and use [place]'s universal  password to access. If you do not have the password, please contact the hospital operator. Locate the [facility] provider you are looking for under [facility] and page to a number that you can be directly reached. If you still have difficulty reaching the provider, please page the [facility] (Director on Call) for the Hospitalists listed on amion for assistance.  05/11/2021, 7:02 PM

## 2021-05-11 NOTE — Progress Notes (Signed)
Pharmacy Antibiotic Note  Shane [name] is a 87 y.o. male admitted on 05/11/2021 with pneumonia.  Pharmacy has been consulted for cefepime dosing.  14 yom with a history of macular degeneration, iron deficiency anemia, CKD 3B, hypertension, C. difficile, asthma, GERD, hyperlipidemia, MGUS, spinal stenosis, squamous cell skin cancer, gout, diastolic heart failure, BPH. Patient cc: of weakness, fever, cough, nausea, and vomiting.  WBC 12.8; LA 1.3. Procal pending.  COVID (+) on 05/02/21.  Plan: Cefepime 2g q24h Monitor renal function F/u cultures De-escalate antitbiotics as appropriate     Temp (24hrs), Avg:97.5 F (36.4 C), Min:97.4 F (36.3 C), Max:97.5 F (36.4 C)  Recent Labs  Lab 05/10/21 1628 05/11/21 1415  WBC 9.9 12.8*  CREATININE 3.00* 3.68*    Estimated Creatinine Clearance: 14.4 mL/min (A) (by C-G formula based on SCr of 3.68 mg/dL (H)).    No Known Allergies  Antimicrobials this admission: Cefepime 8/12 >>  Microbiology results: Pending  Thank you for allowing pharmacy to be a part of this patient's care.  Lorelei Pont, PharmD, BCPS 05/11/2021 7:51 PM ED Clinical Pharmacist -  [phone]

## 2021-05-11 NOTE — ED Triage Notes (Signed)
Pt here from home with c/o n/v/vd , covid positive on the 3rd ,

## 2021-05-11 NOTE — ED Provider Notes (Signed)
Emergency Medicine Provider Triage Evaluation Note  Shane [name] , a 87 y.o. male  was evaluated in triage.  Pt complains of sob and dehydration. Was recently dx with covid, had abnormal renal function on labs at pcp office  Review of Systems  Positive: Sob, dehydration Negative: Chest pain  Physical Exam  BP 94/64   Pulse 98   Temp (!) 97.5 F (36.4 C)   Resp 18   SpO2 96%  Gen:   Awake, no distress   Resp:  Normal effort  MSK:   Moves extremities without difficulty   Medical Decision Making  Medically screening exam initiated at 2:07 PM.  Appropriate orders placed.  Shane [name] was informed that the remainder of the evaluation will be completed by another provider, this initial triage assessment does not replace that evaluation, and the importance of remaining in the ED until their evaluation is complete.     Bishop Dublin 05/11/21 1415    Pattricia Boss, MD 05/15/21 [phone]

## 2021-05-11 NOTE — Telephone Encounter (Signed)
Poke to patient's daughter, and informed her of the labs, his kidney function has decreased further.  At the time of this conversation I did not have his x-ray results back.  I did recommend he go to the emergency room for IV hydration and further treatment.  He reports that the Remeron did not help him sleep last night.  He is not eating or drinking.

## 2021-05-11 NOTE — ED Notes (Signed)
Pt wanted off the telemonitor, EDP okay with pt off of monitor. Pt agreed to pulse ox and BP cuff. Male purewick placed as well

## 2021-05-12 ENCOUNTER — Encounter (HOSPITAL_COMMUNITY): Payer: Self-pay | Admitting: Internal Medicine

## 2021-05-12 ENCOUNTER — Inpatient Hospital Stay (HOSPITAL_COMMUNITY): Payer: Medicare HMO

## 2021-05-12 DIAGNOSIS — D509 Iron deficiency anemia, unspecified: Secondary | ICD-10-CM | POA: Diagnosis present

## 2021-05-12 DIAGNOSIS — E785 Hyperlipidemia, unspecified: Secondary | ICD-10-CM | POA: Diagnosis present

## 2021-05-12 DIAGNOSIS — Z8249 Family history of ischemic heart disease and other diseases of the circulatory system: Secondary | ICD-10-CM | POA: Diagnosis not present

## 2021-05-12 DIAGNOSIS — G9341 Metabolic encephalopathy: Secondary | ICD-10-CM | POA: Diagnosis not present

## 2021-05-12 DIAGNOSIS — N184 Chronic kidney disease, stage 4 (severe): Secondary | ICD-10-CM | POA: Diagnosis present

## 2021-05-12 DIAGNOSIS — J45909 Unspecified asthma, uncomplicated: Secondary | ICD-10-CM | POA: Diagnosis present

## 2021-05-12 DIAGNOSIS — Z85828 Personal history of other malignant neoplasm of skin: Secondary | ICD-10-CM | POA: Diagnosis not present

## 2021-05-12 DIAGNOSIS — R627 Adult failure to thrive: Secondary | ICD-10-CM | POA: Diagnosis present

## 2021-05-12 DIAGNOSIS — I10 Essential (primary) hypertension: Secondary | ICD-10-CM | POA: Diagnosis not present

## 2021-05-12 DIAGNOSIS — E869 Volume depletion, unspecified: Secondary | ICD-10-CM | POA: Diagnosis not present

## 2021-05-12 DIAGNOSIS — R5381 Other malaise: Secondary | ICD-10-CM | POA: Diagnosis not present

## 2021-05-12 DIAGNOSIS — Y95 Nosocomial condition: Secondary | ICD-10-CM | POA: Diagnosis not present

## 2021-05-12 DIAGNOSIS — E781 Pure hyperglyceridemia: Secondary | ICD-10-CM | POA: Diagnosis present

## 2021-05-12 DIAGNOSIS — Z8616 Personal history of COVID-19: Secondary | ICD-10-CM | POA: Diagnosis not present

## 2021-05-12 DIAGNOSIS — Z7189 Other specified counseling: Secondary | ICD-10-CM | POA: Diagnosis not present

## 2021-05-12 DIAGNOSIS — N179 Acute kidney failure, unspecified: Secondary | ICD-10-CM | POA: Diagnosis not present

## 2021-05-12 DIAGNOSIS — N1832 Chronic kidney disease, stage 3b: Secondary | ICD-10-CM | POA: Diagnosis not present

## 2021-05-12 DIAGNOSIS — R531 Weakness: Secondary | ICD-10-CM | POA: Diagnosis not present

## 2021-05-12 DIAGNOSIS — K219 Gastro-esophageal reflux disease without esophagitis: Secondary | ICD-10-CM | POA: Diagnosis present

## 2021-05-12 DIAGNOSIS — I130 Hypertensive heart and chronic kidney disease with heart failure and stage 1 through stage 4 chronic kidney disease, or unspecified chronic kidney disease: Secondary | ICD-10-CM | POA: Diagnosis present

## 2021-05-12 DIAGNOSIS — E860 Dehydration: Secondary | ICD-10-CM | POA: Diagnosis present

## 2021-05-12 DIAGNOSIS — I959 Hypotension, unspecified: Secondary | ICD-10-CM | POA: Diagnosis not present

## 2021-05-12 DIAGNOSIS — J189 Pneumonia, unspecified organism: Secondary | ICD-10-CM | POA: Diagnosis not present

## 2021-05-12 DIAGNOSIS — I5032 Chronic diastolic (congestive) heart failure: Secondary | ICD-10-CM | POA: Diagnosis not present

## 2021-05-12 DIAGNOSIS — M109 Gout, unspecified: Secondary | ICD-10-CM | POA: Diagnosis present

## 2021-05-12 DIAGNOSIS — N281 Cyst of kidney, acquired: Secondary | ICD-10-CM | POA: Diagnosis not present

## 2021-05-12 DIAGNOSIS — Z66 Do not resuscitate: Secondary | ICD-10-CM | POA: Diagnosis not present

## 2021-05-12 DIAGNOSIS — I5033 Acute on chronic diastolic (congestive) heart failure: Secondary | ICD-10-CM | POA: Diagnosis not present

## 2021-05-12 DIAGNOSIS — R112 Nausea with vomiting, unspecified: Secondary | ICD-10-CM | POA: Diagnosis not present

## 2021-05-12 DIAGNOSIS — K449 Diaphragmatic hernia without obstruction or gangrene: Secondary | ICD-10-CM | POA: Diagnosis present

## 2021-05-12 DIAGNOSIS — N400 Enlarged prostate without lower urinary tract symptoms: Secondary | ICD-10-CM | POA: Diagnosis present

## 2021-05-12 DIAGNOSIS — Z515 Encounter for palliative care: Secondary | ICD-10-CM | POA: Diagnosis not present

## 2021-05-12 DIAGNOSIS — F419 Anxiety disorder, unspecified: Secondary | ICD-10-CM | POA: Diagnosis present

## 2021-05-12 DIAGNOSIS — E872 Acidosis: Secondary | ICD-10-CM | POA: Diagnosis not present

## 2021-05-12 LAB — URINALYSIS, ROUTINE W REFLEX MICROSCOPIC
Bacteria, UA: NONE SEEN
Bilirubin Urine: NEGATIVE
Glucose, UA: NEGATIVE mg/dL
Hgb urine dipstick: NEGATIVE
Ketones, ur: NEGATIVE mg/dL
Leukocytes,Ua: NEGATIVE
Nitrite: NEGATIVE
Protein, ur: 30 mg/dL — AB
Specific Gravity, Urine: 1.015 (ref 1.005–1.030)
pH: 5 (ref 5.0–8.0)

## 2021-05-12 LAB — CBC
HCT: 42.7 % (ref 39.0–52.0)
Hemoglobin: 13.5 g/dL (ref 13.0–17.0)
MCH: 29.9 pg (ref 26.0–34.0)
MCHC: 31.6 g/dL (ref 30.0–36.0)
MCV: 94.7 fL (ref 80.0–100.0)
Platelets: 247 10*3/uL (ref 150–400)
RBC: 4.51 MIL/uL (ref 4.22–5.81)
RDW: 14.5 % (ref 11.5–15.5)
WBC: 9.9 10*3/uL (ref 4.0–10.5)
nRBC: 0 % (ref 0.0–0.2)

## 2021-05-12 LAB — COMPREHENSIVE METABOLIC PANEL
ALT: 21 U/L (ref 0–44)
AST: 32 U/L (ref 15–41)
Albumin: 2.4 g/dL — ABNORMAL LOW (ref 3.5–5.0)
Alkaline Phosphatase: 26 U/L — ABNORMAL LOW (ref 38–126)
Anion gap: 14 (ref 5–15)
BUN: 100 mg/dL — ABNORMAL HIGH (ref 8–23)
CO2: 12 mmol/L — ABNORMAL LOW (ref 22–32)
Calcium: 9.2 mg/dL (ref 8.9–10.3)
Chloride: 120 mmol/L — ABNORMAL HIGH (ref 98–111)
Creatinine, Ser: 3.57 mg/dL — ABNORMAL HIGH (ref 0.61–1.24)
GFR, Estimated: 16 mL/min — ABNORMAL LOW (ref >60)
Glucose, Bld: 96 mg/dL (ref 70–99)
Potassium: 4.3 mmol/L (ref 3.5–5.1)
Sodium: 146 mmol/L — ABNORMAL HIGH (ref 135–145)
Total Bilirubin: 0.9 mg/dL (ref 0.3–1.2)
Total Protein: 5.9 g/dL — ABNORMAL LOW (ref 6.5–8.1)

## 2021-05-12 LAB — MAGNESIUM: Magnesium: 1.9 mg/dL (ref 1.7–2.4)

## 2021-05-12 LAB — PROCALCITONIN: Procalcitonin: 0.25 ng/mL

## 2021-05-12 MED ORDER — SODIUM BICARBONATE 8.4 % IV SOLN
INTRAVENOUS | Status: DC
  Filled 2021-05-12 (×4): qty 1000

## 2021-05-12 MED ORDER — ENSURE ENLIVE PO LIQD: 237.0000 mL | Freq: Two times a day (BID) | ORAL | Status: DC

## 2021-05-12 MED ORDER — HALOPERIDOL LACTATE 5 MG/ML IJ SOLN
1.0000 mg | Freq: Four times a day (QID) | INTRAMUSCULAR | Status: DC | PRN
  Administered 2021-05-12 – 2021-05-14 (×4): 2 mg via INTRAVENOUS
  Filled 2021-05-12 (×4): qty 1

## 2021-05-12 MED ORDER — HALOPERIDOL 1 MG PO TABS
2.0000 mg | ORAL_TABLET | ORAL | Status: AC
  Filled 2021-05-12: qty 2

## 2021-05-12 MED ORDER — SODIUM CHLORIDE 0.9 % IV BOLUS
1000.0000 mL | Freq: Once | INTRAVENOUS | Status: AC
  Administered 2021-05-13: 1000 mL via INTRAVENOUS

## 2021-05-12 MED ORDER — FOLIC ACID 1 MG PO TABS
1.0000 mg | ORAL_TABLET | Freq: Every day | ORAL | Status: DC
  Administered 2021-05-12 – 2021-05-13 (×2): 1 mg via ORAL
  Filled 2021-05-12 (×3): qty 1

## 2021-05-12 MED ORDER — QUETIAPINE FUMARATE 25 MG PO TABS
25.0000 mg | ORAL_TABLET | Freq: Every day | ORAL | Status: DC
  Administered 2021-05-12 – 2021-05-13 (×2): 25 mg via ORAL
  Filled 2021-05-12 (×3): qty 1

## 2021-05-12 MED ORDER — HALOPERIDOL LACTATE 5 MG/ML IJ SOLN
2.0000 mg | INTRAMUSCULAR | Status: AC
  Administered 2021-05-12: 2 mg via INTRAMUSCULAR
  Filled 2021-05-12: qty 1

## 2021-05-12 NOTE — Evaluation (Signed)
Occupational Therapy Evaluation Patient Details Name: Shane [name] MRN: [id number] DOB: 1932-09-23 Today's Date: 05/12/2021    History of Present Illness 87 y.o. male with medical history significant of macular degeneration, iron deficiency anemia, CKD 3B, hypertension, C. difficile, asthma, GERD, hyperlipidemia, MGUS, spinal stenosis, squamous cell skin cancer, gout, diastolic heart failure, BPH who presents with a constellation of symptoms including nausea, vomiting, cough, chills with decreased p.o. intake and weakness.   Clinical Impression   Patient admitted for the diagnosis above.  He had a recent admit earlier this month.  PTA he was needing supervision for in home mobility, up to Mod A from son for ADL, decreased PO intake and increasing confusion per daughter.  Barriers are listed below.  Currently he is needing up to [place] a for mobility in the ED setting, and up to Mod A for lower body ADL.  Family is hoping for a return home with [facility] services and increased assist at night.  OT will follow in the acute setting to maximize his functional status.      Follow Up Recommendations  Home health OT;Supervision/Assistance - 24 hour    Equipment Recommendations  None recommended by OT    Recommendations for Other Services       Precautions / Restrictions Precautions Precautions: Fall;Other (comment) Precaution Comments: watch sats, not on airborne precautions Restrictions Weight Bearing Restrictions: No      Mobility Bed Mobility Overal bed mobility: Needs Assistance Bed Mobility: Supine to Sit;Sit to Supine     Supine to sit: Min assist Sit to supine: Min assist     Patient Response: Cooperative  Transfers Overall transfer level: Needs assistance   Transfers: Stand Pivot Transfers;Sit to/from Stand Sit to Stand: Min assist Stand pivot transfers: Min assist            Balance Overall balance assessment: Needs assistance;History of Falls Sitting-balance support:  Feet supported;Single extremity supported Sitting balance-Leahy Scale: Good     Standing balance support: Bilateral upper extremity supported;During functional activity Standing balance-Leahy Scale: Poor Standing balance comment: needs BUE support for dynamic tasks                           ADL either performed or assessed with clinical judgement   ADL Overall ADL's : Needs assistance/impaired Eating/Feeding: Set up;Sitting               Upper Body Dressing : Set up;Sitting   Lower Body Dressing: Moderate assistance;Sit to/from stand               Functional mobility during ADLs: Minimal assistance General ADL Comments: HHA in ED     Vision Baseline Vision/History: Wears glasses Wears Glasses: At all times Patient Visual Report: No change from baseline       Perception     Praxis      Pertinent Vitals/Pain Pain Assessment: No/denies pain     Hand Dominance Right   Extremity/Trunk Assessment Upper Extremity Assessment Upper Extremity Assessment: Generalized weakness   Lower Extremity Assessment Lower Extremity Assessment: Defer to PT evaluation   Cervical / Trunk Assessment Cervical / Trunk Assessment: Kyphotic   Communication Communication Communication: No difficulties   Cognition Arousal/Alertness: Awake/alert Behavior During Therapy: WFL for tasks assessed/performed Overall Cognitive Status: Impaired/Different from baseline Area of Impairment: Orientation;Awareness;Problem solving;Safety/judgement;Following commands;Memory;Attention                 Orientation Level: Disoriented to;Place;Time;Situation Current Attention Level: Focused  Memory: Decreased short-term memory Following Commands: Follows one step commands consistently;Follows multi-step commands inconsistently Safety/Judgement: Decreased awareness of deficits;Decreased awareness of safety Awareness: Intellectual Problem Solving: Slow processing;Requires verbal  cues;Difficulty sequencing     General Comments       Exercises     Shoulder Instructions      Home Living Family/patient expects to be discharged to:: Private residence Living Arrangements: Spouse/significant other;Other (Comment) (daughter has been staying with them since his last discharge 8/4) Available Help at Discharge: Family;Available 24 hours/day Type of Home: House Home Access: Stairs to enter [employer] of Steps: 2 Entrance Stairs-Rails: Right;Left Home Layout: Multi-level;Laundry or work area in basement;Able to live on main level with bedroom/bathroom     Bathroom Shower/Tub: Occupational psychologist: Handicapped height Bathroom Accessibility: Yes How Accessible: Accessible via walker Home Equipment: Environmental consultant - 2 wheels;Cane - single point;Bedside commode;Hand held shower head;Shower seat;Wheelchair - manual          Prior Functioning/Environment Level of Independence: Needs assistance  Gait / Transfers Assistance Needed: Has been walking in the home with generalized supervision ADL's / Homemaking Assistance Needed: sn has been providing up to Mod A for ADL completion from sit/stand.  Family helping with meals, meds, home management, bills and community mobility.   Comments: Two weeks prior patient was cutting the grass, independent with all ADL/IADL.        OT Problem List: Cardiopulmonary status limiting activity;Decreased activity tolerance;Impaired balance (sitting and/or standing);Decreased strength;Decreased knowledge of use of DME or AE      OT Treatment/Interventions: Self-care/ADL training;Therapeutic exercise;Therapeutic activities;Energy conservation;Patient/family education;DME and/or AE instruction;Balance training    OT Goals(Current goals can be found in the care plan section) Acute Rehab OT Goals Patient Stated Goal: Return home OT Goal Formulation: With patient Time For Goal Achievement: 05/26/21 Potential to Achieve  Goals: Good  OT Frequency: Min 2X/week   Barriers to D/C:    none noted       Co-evaluation              AM-PAC OT "6 Clicks" Daily Activity     Outcome Measure Help from another person eating meals?: None Help from another person taking care of personal grooming?: A Little Help from another person toileting, which includes using toliet, bedpan, or urinal?: A Little Help from another person bathing (including washing, rinsing, drying)?: A Lot Help from another person to put on and taking off regular upper body clothing?: A Little Help from another person to put on and taking off regular lower body clothing?: A Lot 6 Click Score: 17   End of Session Nurse Communication: Mobility status  Activity Tolerance: Patient tolerated treatment well Patient left: in bed;with call bell/phone within reach;with family/visitor present  OT Visit Diagnosis: History of falling (Z91.81);Muscle weakness (generalized) (M62.81)                Time: 3614-4315 OT Time Calculation (min): 26 min Charges:  OT General Charges $OT Visit: 1 Visit OT Evaluation $OT Eval Moderate Complexity: 1 Mod OT Treatments $Self Care/Home Management : 8-22 mins  05/12/2021  Rich, OTR/L  Acute Rehabilitation Services  Office:  [phone]   Metta Clines 05/12/2021, 1:57 PM

## 2021-05-12 NOTE — ED Notes (Signed)
Report attempted. Left message

## 2021-05-12 NOTE — ED Notes (Signed)
Pt agitated and confused, ripping monitor off and ripped IV out. Paged MD for orders.

## 2021-05-12 NOTE — Progress Notes (Signed)
Patient ID: Shane [name], male   DOB: 12-14-1931, 87 y.o.   MRN: [id number]  PROGRESS NOTE    JOHNN KRASOWSKI  HKV:[id number] DOB: [birth year] DOA: 05/11/2021 PCP: Dorothyann Peng, NP   Brief Narrative:  87 y.o. male with medical history significant of macular degeneration, iron deficiency anemia, CKD IV, hypertension, C. difficile, asthma, GERD, hyperlipidemia, MGUS, spinal stenosis, squamous cell skin cancer, gout, diastolic heart failure, BPH, recent hospitalization from 04/30/2021-05/03/2021 with acute hypoxic respiratory failure secondary to pneumonia/acute on chronic diastolic heart failure and acute kidney injury treated with antibiotics and Lasix presented with generalized weakness, very poor oral intake, fever, cough and nausea and vomiting.  On presentation, bicarb was 16, creatinine of 3.68 (baseline of 2-2.5), WBC of 12.8.  Chest x-ray showed stable patchy opacities left greater than right at the base similar to previous admission but new streaky opacity at the right suprahilar region with stable left small pleural effusion.  He was started on IV fluids and antibiotics.  Assessment & Plan:   Acute kidney injury on chronic any disease stage IV Acute metabolic acidosis -Baseline creatinine around 2-2.5.  Creatinine 2.7 to on discharge on 05/03/2021.  Presented with creatinine of 3.68 and bicarb of 16.  Treated with IV fluids. -Creatinine 3.57 today.  Bicarb is worsening to 12.  Switch IV fluids to bicarb drip at 75 cc an hour.  Renal ultrasound.  I have consulted nephrology/Dr. Jonnie Finner.  Strict input and output.  Daily weights.  Probable healthcare associated pneumonia -Chest x-ray on presentation showed possible new streaky opacity.  Patient recently was treated with antibiotics for pneumonia.  Last hospitalization -Currently on cefepime.  Procalcitonin 0.67 on presentation and improving to 0.25.   -Currently on room air  Acute metabolic encephalopathy Very poor oral  intake Dehydration Delirium -Patient's oral intake has been very poor since recent hospital discharge with increasing confusion at home.  As per the daughter, patient does not have known diagnosis of dementia -Unclear if the patient has underlying dementia as well.  Hopefully mental status will start improving with treatment for pneumonia and improving and renal function. -Monitor mental status.  Fall precautions. -SLP evaluation.  PT evaluation. -Patient was agitated overnight and required Haldol.  Will use Haldol as needed.  Patient was also started on Remeron recently by PCP with no improvement as per the daughter.  We will start scheduled Seroquel at night which will help him sleep better at the hospital.  Daughter agreeable. -Discussed CODE STATUS with the daughter and recommended to switch him to DNR: She will discuss with her brother.  I will request palliative care consultation as well.  Overall prognosis is guarded to poor considering older age, recurrent hospitalizations, poor oral intake, worsening renal function.  Doubt that he would be a candidate for dialysis.  COVID-19 positive -Originally tested positive for COVID-19 on April 14, 2021 and was tested positive again on 05/02/2021.  Patient will not need any more testing.   -Does not need any more isolation  Leukocytosis -Resolved neck  Hypertension -Blood pressure stable and on the lower side.  Continue amlodipine, atenolol, Imdur  Hyperlipidemia -Continue statin  BPH -Continue finasteride  Chronic diastolic heart failure -Recent echo showed EF of 55 to 60% with grade 1 diastolic dysfunction.  Cardiology had recently evaluated him during last hospitalization and recommended outpatient follow-up.  Continue beta-blocker, isosorbide mononitrate.  Lasix and chlorthalidone on hold.  Strict input and output.  Daily weights.    Asthma -Continue albuterol  Gout -Allopurinol  on hold in the setting of AKI   DVT prophylaxis:  Lovenox Code Status: Full Family Communication: Daughter at bedside Disposition Plan: Status is: Inpatient  Remains inpatient appropriate because:Inpatient level of care appropriate due to severity of illness  Dispo: The patient is from: Home              Anticipated d/c is to: Home              Patient currently is not medically stable to d/c.   Difficult to place patient No  Consultants: Nephrology  Procedures: None  Antimicrobials: Cefepime from 05/11/2021 onwards   Subjective: Patient seen and examined at bedside.  Wakes up slightly, very poor historian.  Nursing staff reported agitation overnight.  Spoke to daughter at bedside who thinks that he ate more this morning than recently at home.  Objective: Vitals:   05/12/21 0100 05/12/21 0231 05/12/21 0736 05/12/21 0800  BP: 118/65  118/62 (!) 142/76  Pulse: 88 100 97 62  Resp: 18  20 16   Temp:   98 F (36.7 C)   TempSrc:   Oral   SpO2: 91% 98% 94% 96%   No intake or output data in the 24 hours ending 05/12/21 1038 There were no vitals filed for this visit.  Examination:  General exam: Appears calm and comfortable.  Currently on room air.  Elderly male lying in bed.  Looks chronically ill and deconditioned Respiratory system: Bilateral decreased breath sounds at bases with scattered crackles Cardiovascular system: S1 & S2 heard, Rate controlled Gastrointestinal system: Abdomen is nondistended, soft and nontender. Normal bowel sounds heard. Extremities: No cyanosis, clubbing similar trace lower extremity edema Central nervous system: Alert, extremely slow to respond, slightly confused.  Poor historian.  No focal neurological deficits. Moving extremities Skin: No rashes, lesions or ulcers Psychiatry: Could not be assessed because of mental status   Data Reviewed: I have personally reviewed following labs and imaging studies  CBC: Recent Labs  Lab 05/10/21 1628 05/11/21 1415 05/12/21 0848  WBC 9.9 12.8* 9.9   NEUTROABS 6.5 7.9*  --   HGB 14.1 14.3 13.5  HCT 43.4 44.3 42.7  MCV 92.6 93.3 94.7  PLT 336.0 369 998   Basic Metabolic Panel: Recent Labs  Lab 05/10/21 1628 05/11/21 1415 05/12/21 0848  NA 147* 144 146*  K 4.7 4.4 4.3  CL 113* 114* 120*  CO2 18* 16* 12*  GLUCOSE 96 113* 96  BUN 98* 106* 100*  CREATININE 3.00* 3.68* 3.57*  CALCIUM 9.6 9.9 9.2  MG  --   --  1.9   GFR: Estimated Creatinine Clearance: 14.9 mL/min (A) (by C-G formula based on SCr of 3.57 mg/dL (H)). Liver Function Tests: Recent Labs  Lab 05/10/21 1628 05/11/21 1415 05/12/21 0848  AST 25 29 32  ALT 18 22 21   ALKPHOS 33* 31* 26*  BILITOT 0.7 0.6 0.9  PROT 6.2 6.6 5.9*  ALBUMIN 3.2* 2.7* 2.4*   No results for input(s): LIPASE, AMYLASE in the last 168 hours. No results for input(s): AMMONIA in the last 168 hours. Coagulation Profile: No results for input(s): INR, PROTIME in the last 168 hours. Cardiac Enzymes: No results for input(s): CKTOTAL, CKMB, CKMBINDEX, TROPONINI in the last 168 hours. BNP (last 3 results) No results for input(s): PROBNP in the last 8760 hours. HbA1C: No results for input(s): HGBA1C in the last 72 hours. CBG: No results for input(s): GLUCAP in the last 168 hours. Lipid Profile: No results for input(s): CHOL, HDL,  LDLCALC, TRIG, CHOLHDL, LDLDIRECT in the last 72 hours. Thyroid Function Tests: No results for input(s): TSH, T4TOTAL, FREET4, T3FREE, THYROIDAB in the last 72 hours. Anemia Panel: No results for input(s): VITAMINB12, FOLATE, FERRITIN, TIBC, IRON, RETICCTPCT in the last 72 hours. Sepsis Labs: Recent Labs  Lab 05/12/21 0848  PROCALCITON 0.25    No results found for this or any previous visit (from the past 240 hour(s)).       Radiology Studies: DG Chest 2 View  Result Date: 05/11/2021 CLINICAL DATA:  Follow-up pneumonia. Cough and congestion. Shortness of breath. EXAM: CHEST - 2 VIEW COMPARISON:  Most recent radiograph 05/02/2021.  CT 04/30/2021  FINDINGS: Slight improvement in patchy left greater than right basilar opacities, mild to moderate residual. There is a new streaky opacity in the right suprahilar lung. Suspect trace left pleural effusion. Stable heart size and mediastinal contours with aortic atherosclerosis. No pulmonary edema or pneumothorax. No acute osseous abnormalities are seen. IMPRESSION: 1. New streaky right suprahilar opacity, atelectasis versus pneumonia in the setting of COVID. 2. Slight improvement in left greater than right basilar opacities, mild to moderate residual. Suspect trace left pleural effusion. Electronically Signed   By: Keith Rake M.D.   On: 05/11/2021 15:15   DG Chest 2 View  Result Date: 05/11/2021 CLINICAL DATA:  Shortness of breath.  COVID. EXAM: CHEST - 2 VIEW COMPARISON:  Radiograph yesterday. Additional priors reviewed. Chest CT 04/30/2021 FINDINGS: Persistent patchy opacity at the left greater than right lung base. Minimal vague streaky opacity in the right suprahilar lung. Stable heart size and mediastinal contours. Aortic atherosclerosis. There is a small left pleural effusion. No pulmonary edema. No pneumothorax. Thoracic spondylosis. Widening of the right acromioclavicular joint may be postsurgical. IMPRESSION: Unchanged patchy opacity at the left greater than right lung base. Minimal vague streaky opacity in the right suprahilar lung. Small left pleural effusion. Electronically Signed   By: Keith Rake M.D.   On: 05/11/2021 15:14        Scheduled Meds:  amLODipine  5 mg Oral Daily   aspirin  81 mg Oral Daily   atenolol  25 mg Oral Daily   enoxaparin (LOVENOX) injection  30 mg Subcutaneous Q24H   finasteride  5 mg Oral Daily   isosorbide mononitrate  15 mg Oral Daily   mirtazapine  30 mg Oral QHS   multivitamin  1 tablet Oral Daily   simvastatin  10 mg Oral QHS   sodium chloride flush  3 mL Intravenous Q12H   Continuous Infusions:  ceFEPime (MAXIPIME) IV     sodium  bicarbonate 150 mEq in D5W infusion            Aline August, MD [facility] 05/12/2021, 10:38 AM

## 2021-05-12 NOTE — ED Notes (Signed)
Daughter expressing concern that pt is getting agitated.  PT has hx of agitation associated with demetia/sundowning during this visit. I am not familiar enough with this pt yet to make a judgment.  Plan to give a 2mg  dose of Haldol to prevent escalation based on daughter's familiarity with pt's baseline.

## 2021-05-12 NOTE — ED Notes (Signed)
Pt's linen and male purewick changed, warm blankets given

## 2021-05-12 NOTE — ED Notes (Signed)
Daughter has gone home for a while.  I told her I would approve the Son to come visit for the purposes of safety for pt and b/c he has been down here for so l long

## 2021-05-12 NOTE — Consult Note (Signed)
Consultation Note Date: 05/12/2021   Patient Name: Shane [name]  DOB: November 07, 1931  MRN: [id number]  Age / Sex: 87 y.o., male  PCP: Dorothyann Peng, NP Referring Physician: Aline August, MD  Reason for Consultation: Establishing goals of care  HPI/Patient Profile: 87 y.o. male  with past medical history of  macular degeneration, iron deficiency anemia, CKD 3B, hypertension, C. difficile, asthma, GERD, hyperlipidemia, MGUS, spinal stenosis, squamous cell skin cancer, gout, diastolic heart failure, BPH admitted on 05/11/2021 with weakness, decreased PO intake, nausea, vomiting, cough, chills.   Patient recently admitted in August for respiratory failure due to a combination of community-acquired pneumonia and diastolic CHF. ED work up showed progression of chronic kidney disease. Palliative medicine has been consulted to assist with goals of care conversation.  Clinical Assessment and Goals of Care:  I have reviewed medical records including EPIC notes, labs and imaging, assessed the patient and then met at the bedside along with patient's daughter Claiborne Billings and son/HCPOA Lanny Hurst (via phone) to discuss diagnosis prognosis, [place], EOL wishes, disposition and options.  I introduced Palliative Medicine as specialized medical care for people living with serious illness. It focuses on providing relief from the symptoms and stress of a serious illness. The goal is to improve quality of life for both the patient and the family.  We discussed a brief life review of the patient and then focused on their current illness. Patient and his spouse live at home alone with [facility] staying overnight twice per week and both adult children visiting frequently to provide care and support. He also has home health services, but Claiborne Billings and Lanny Hurst are concerned that he will likely need more caregiver support. They are looking into securing VA benefits.  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 He has enjoyed his independence and activities such as golf and attending church. Unfortunately, over the past couple of weeks he has gone from ambulating on his own to needing a walker and wheelchair. He has been eating and drinking very little, although today's intake has been an improvement since arrival. Patient's children understand the severity of his illness, including recurrent pneumonias and progressing kidney disease. They understand that he is a poor candidate for dialysis if he progresses to ESRD.  I attempted to elicit values and goals of care important to the patient.   Claiborne Billings shares that patient has had more advance care planning conversations with Lanny Hurst than with her. He has expressed his wish to have a DNR order. He adamantly states that he does not want to go to a nursing home, although they are uncertain what he would think about an assisted living facility. Patient has made comments referencing that he is "not sure I'm gonna make it" in the past. Claiborne Billings and Lanny Hurst would not pursue a feeding tube, but they are hopeful that patient will recover and be able to participate in these discussions moving forward.  The difference between aggressive medical intervention and comfort care was considered in light of the  patient's goals of care.   Advanced directives, concepts specific to code status, artifical feeding and hydration, and rehospitalization were considered and discussed.  Hospice and Palliative Care services outpatient were explained and offered.  Discussed the importance of continued conversation with family and the medical providers regarding overall plan of care and treatment options, ensuring decisions are within the context of the patient's values and GOCs.    Questions and concerns were addressed. The family was encouraged to call with questions or concerns.  PMT will continue to support holistically.    HCPOA is Jeffie Spivack (son) per report, awaiting documentation.    SUMMARY OF RECOMMENDATIONS   -DNR confirmed -Continue current interventions, patient's family is hopeful for improvement and return home with additional caregivers/HH -Family would not pursue a feeding tube -Psychosocial and emotional support provided -PMT will continue to follow  Code Status/Advance Care Planning: DNR  Palliative Prophylaxis:  Aspiration, Delirium Protocol, and Turn Reposition  Additional Recommendations (Limitations, Scope, Preferences): No Artificial Feeding  Psycho-social/Spiritual:  Desire for further Chaplaincy support:TBD Additional Recommendations: Caregiving  Support/Resources, Education on Hospice, and Referral to [employer]   Prognosis:  Guarded prognosis given functional and nutritional decline, recurrent pneumonia, multiple comorbidities   Discharge Planning: To Be Determined      Primary Diagnoses: Present on Admission:  Acute renal failure superimposed on stage 3b chronic kidney disease ([place])  Essential hypertension  Hypertriglyceridemia  AKI (acute kidney injury) ([place])   I have reviewed the medical record, interviewed the patient and family, and examined the patient. The following aspects are pertinent.  Past Medical History:  Diagnosis Date   Anemia, iron deficiency    C. difficile diarrhea    Cancer (HCC)    forearm-  squamous   Cataract    bil cataracts removed   DDD (degenerative disc disease), cervical    Dysplastic nevus of left upper extremity    Elevated PSA    Essential hypertension    Extrinsic asthma    GERD (gastroesophageal reflux disease)    no recent   Gout    Hiatal hernia    Macular degeneration    Spinal stenosis    Social History   Socioeconomic History   Marital status: Married    Spouse name: Not on file   Number of children: 3   Years of education: Not on file   Highest education level: Not on file  Occupational  History   Occupation: retired  Tobacco Use   Smoking status: Former    Years: 35.00    Types: Cigarettes   Smokeless tobacco: Never   Tobacco comments:    quit late 1980's  Vaping Use   Vaping Use: Never used  Substance and Sexual Activity   Alcohol use: No   Drug use: No   Sexual activity: Not on file  Other Topics Concern   Not on file  Social History Narrative   Retired from Psychologist, educational    Married for 55 years    Three children all live locally    Likes to play golf and go to [employer].    Social Determinants of Health   Financial Resource Strain: Low Risk    Difficulty of Paying Living Expenses: Not hard at all  Food Insecurity: Not on file  Transportation Needs: No Transportation Needs   Lack of Transportation (Medical): No   Lack of Transportation (Non-Medical): No  Physical Activity: Inactive   Days of Exercise per Week: 0 days   Minutes of Exercise per  Session: 0 min  Stress: No Stress Concern Present   Feeling of Stress : Not at all  Social Connections: Moderately Integrated   Frequency of Communication with Friends and Family: Twice a week   Frequency of Social Gatherings with Friends and Family: Once a week   Attends Religious Services: More than 4 times per year   Active Member of [employer] or Organizations: No   Attends Archivist Meetings: Never   Marital Status: Married   Family History  Problem Relation Age of Onset   Multiple myeloma Brother    Prostate cancer Brother    Hypertension Father        ?   Lung cancer Sister    Colon cancer Neg Hx    Stomach cancer Neg Hx    Esophageal cancer Neg Hx    Rectal cancer Neg Hx    Scheduled Meds:  amLODipine  5 mg Oral Daily   aspirin  81 mg Oral Daily   atenolol  25 mg Oral Daily   enoxaparin (LOVENOX) injection  30 mg Subcutaneous Q24H   finasteride  5 mg Oral Daily   folic acid  1 mg Oral Daily   isosorbide mononitrate  15 mg Oral Daily   multivitamin  1 tablet Oral Daily   QUEtiapine   25 mg Oral QHS   simvastatin  10 mg Oral QHS   sodium chloride flush  3 mL Intravenous Q12H   Continuous Infusions:  ceFEPime (MAXIPIME) IV     sodium bicarbonate 150 mEq in D5W infusion     PRN Meds:.acetaminophen **OR** acetaminophen, albuterol, haloperidol lactate, polyethylene glycol Medications Prior to Admission:  Prior to Admission medications   Medication Sig Start Date End Date Taking? Authorizing Provider  acetaminophen (TYLENOL) 500 MG tablet Take 500-1,000 mg by mouth every 6 (six) hours as needed (for pain).   Yes [provider]  albuterol (VENTOLIN HFA) 108 (90 Base) MCG/ACT inhaler Inhale 2 puffs into the lungs every 6 (six) hours as needed for wheezing or shortness of breath. 04/25/21 04/25/22 Yes Panosh, Standley Brooking, MD  allopurinol (ZYLOPRIM) 300 MG tablet Take 1 tablet (300 mg total) by mouth daily. 10/11/20  Yes Nafziger, Tommi Rumps, NP  amLODipine (NORVASC) 5 MG tablet Take 1 tablet (5 mg total) by mouth daily. 05/03/21 05/03/22 Yes Thurnell Lose, MD  aspirin 81 MG chewable tablet Chew 1 tablet (81 mg total) by mouth daily. 05/04/21  Yes Thurnell Lose, MD  atenolol (TENORMIN) 25 MG tablet Take 1 tablet (25 mg total) by mouth daily. 10/11/20  Yes Nafziger, Tommi Rumps, NP  chlorthalidone (HYGROTON) 25 MG tablet Take 0.5 tablets (12.5 mg total) by mouth daily. 10/11/20  Yes Nafziger, Tommi Rumps, NP  finasteride (PROSCAR) 5 MG tablet Take 1 tablet (5 mg total) by mouth daily. 10/11/20  Yes Nafziger, Tommi Rumps, NP  furosemide (LASIX) 40 MG tablet Take 0.5 tablets (20 mg total) by mouth daily as needed for fluid or edema (from baseline58from baseline). 05/03/21  Yes Thurnell Lose, MD  isosorbide mononitrate (IMDUR) 30 MG 24 hr tablet Take 0.5 tablets (15 mg total) by mouth daily. 05/04/21  Yes Thurnell Lose, MD  mirtazapine (REMERON) 30 MG tablet Take 1 tablet (30 mg total) by mouth at bedtime. 05/10/21  Yes Nafziger, Tommi Rumps, NP  Multiple Vitamins-Minerals (PRESERVISION AREDS 2) CAPS Take 1 capsule  by mouth 2 (two) times daily.   Yes [provider]  potassium chloride (KLOR-CON) 10 MEQ tablet Take 1 tablet (10 mEq total)  by mouth daily as needed (Take only if you are taking Lasix that day). 05/03/21  Yes Thurnell Lose, MD  simvastatin (ZOCOR) 10 MG tablet Take 1 tablet (10 mg total) by mouth at bedtime. 10/10/20  Yes Nafziger, Tommi Rumps, NP  benzonatate (TESSALON PERLES) 100 MG capsule Take 1 capsule (100 mg total) by mouth 3 (three) times daily as needed for cough. Patient not taking: Reported on 05/11/2021 04/25/21   Panosh, Standley Brooking, MD  budesonide (ENTOCORT EC) 3 MG 24 hr capsule Take 3 capsules (9 mg total) by mouth daily. 11/27/20   Doran Stabler, MD   No Known Allergies Review of Systems  Unable to perform ROS: Mental status change   Physical Exam Vitals and nursing note reviewed.  Constitutional:      General: He is not in acute distress.    Appearance: He is ill-appearing.  Cardiovascular:     Rate and Rhythm: Tachycardia present.  Pulmonary:     Effort: Pulmonary effort is normal.  Neurological:     Mental Status: He is disoriented.    Vital Signs: BP 106/68   Pulse (!) 102   Temp 98 F (36.7 C) (Oral)   Resp 15   SpO2 94%      Pain Score: 0-No pain   SpO2: SpO2: 94 % O2 Device:SpO2: 94 % O2 Flow Rate: .   IO: Intake/output summary: No intake or output data in the 24 hours ending 05/12/21 1149  LBM:   Baseline Weight:   Most recent weight:       Palliative Assessment/Data: 30-40%     Time In: 11:00am Time Out: 11:40am Time In: 2:00pm Time Out: 2:30pm Time Total: 70 minutes  Greater than 50% of this time was spent in counseling and coordinating care related to the above assessment and plan.  Dorthy Cooler, PA-C Palliative Medicine Team Team phone # [phone]  Thank you for allowing the Palliative Medicine Team to assist in the care of this patient. Please utilize secure chat with additional questions, if there is no response  within 30 minutes please call the above phone number.  Palliative Medicine Team providers are available by phone from 7am to 7pm daily and can be reached through the team cell phone.  Should this patient require assistance outside of these hours, please call the patient's attending physician.

## 2021-05-12 NOTE — ED Notes (Signed)
Pt transported to Ultrasound.  

## 2021-05-12 NOTE — ED Notes (Addendum)
Worked with tech to clean up pt after using bedpan and primofit. Successfully  Son at bedside.

## 2021-05-12 NOTE — Consult Note (Addendum)
Renal Service Consult Note [facility]  Shane [name] 05/12/2021  D , MD Requesting Physician: Dr Alekh  Reason for Consult: Renal failure HPI: The patient is a 88 y.o. year-old w/ hx of Cdif, DDD, HTN, gout, spinal stenosis, CKD presented w/ multiple symptoms of nausea, vomiting, cough, chills and gen weakness w/ poor po intake. Hx of recent admit in August for CAP, diast CHF and resp faliure, and incidental COVID infection. Since returning home family states he is not doing well, no appetite, +n/v and chills. Saw PCP 8/11 and yesterday came to ED.  In ED w/u showed creat 3.6 up from 2-2.5 baseline range. CXR showed pathcy opacities L > R base. Pt rec'd fluid bolus and IV abx.  Asked to see for renal failure.   Pt seen in ED, family is at bedside.  Family provides the history.  Pt is mostly confused, does not offer much in the way of details.   ROS - denies CP, no joint pain, no HA, no blurry vision, no rash, no diarrhea, no nausea/ vomiting   Past Medical History  Past Medical History:  Diagnosis Date   Anemia, iron deficiency    C. difficile diarrhea    Cancer (HCC)    forearm-  squamous   Cataract    bil cataracts removed   DDD (degenerative disc disease), cervical    Dysplastic nevus of left upper extremity    Elevated PSA    Essential hypertension    Extrinsic asthma    GERD (gastroesophageal reflux disease)    no recent   Gout    Hiatal hernia    Macular degeneration    Spinal stenosis    Past Surgical History  Past Surgical History:  Procedure Laterality Date   BACK SURGERY     Bone Spur Right    Shoulder   COLONOSCOPY     COLONOSCOPY W/ POLYPECTOMY     COLONOSCOPY WITH PROPOFOL N/A 11/19/2016   Procedure: COLONOSCOPY WITH PROPOFOL;  Surgeon: Henry L Danis III, MD;  Location: [facility] ENDOSCOPY;  Service: Gastroenterology;  Laterality: N/A;   FECAL TRANSPLANT N/A 11/19/2016   Procedure: FECAL TRANSPLANT;  Surgeon: Henry L Danis III, MD;   Location: [facility] ENDOSCOPY;  Service: Gastroenterology;  Laterality: N/A;   MOUTH SURGERY     2 teeth removed resulting in a dry socket in one of them   POLYPECTOMY     SKIN CANCER EXCISION     Family History  Family History  Problem Relation Age of Onset   Multiple myeloma Brother    Prostate cancer Brother    Hypertension Father        ?   Lung cancer Sister    Colon cancer Neg Hx    Stomach cancer Neg Hx    Esophageal cancer Neg Hx    Rectal cancer Neg Hx    Social History  reports that he has quit smoking. His smoking use included cigarettes. He has never used smokeless tobacco. He reports that he does not drink alcohol and does not use drugs. Allergies No Known Allergies Home medications Prior to Admission medications   Medication Sig Start Date End Date Taking? Authorizing Provider  acetaminophen (TYLENOL) 500 MG tablet Take 500-1,000 mg by mouth every 6 (six) hours as needed (for pain).   Yes [provider]  albuterol (VENTOLIN HFA) 108 (90 Base) MCG/ACT inhaler Inhale 2 puffs into the lungs every 6 (six) hours as needed for wheezing or shortness of breath. 04/25/21   04/25/22 Yes Panosh, Standley Brooking, MD  allopurinol (ZYLOPRIM) 300 MG tablet Take 1 tablet (300 mg total) by mouth daily. 10/11/20  Yes Nafziger, Tommi Rumps, NP  amLODipine (NORVASC) 5 MG tablet Take 1 tablet (5 mg total) by mouth daily. 05/03/21 05/03/22 Yes Thurnell Lose, MD  aspirin 81 MG chewable tablet Chew 1 tablet (81 mg total) by mouth daily. 05/04/21  Yes Thurnell Lose, MD  atenolol (TENORMIN) 25 MG tablet Take 1 tablet (25 mg total) by mouth daily. 10/11/20  Yes Nafziger, Tommi Rumps, NP  chlorthalidone (HYGROTON) 25 MG tablet Take 0.5 tablets (12.5 mg total) by mouth daily. 10/11/20  Yes Nafziger, Tommi Rumps, NP  finasteride (PROSCAR) 5 MG tablet Take 1 tablet (5 mg total) by mouth daily. 10/11/20  Yes Nafziger, Tommi Rumps, NP  furosemide (LASIX) 40 MG tablet Take 0.5 tablets (20 mg total) by mouth daily as needed for fluid or edema  (from baseline44fom baseline). 05/03/21  Yes SThurnell Lose MD  isosorbide mononitrate (IMDUR) 30 MG 24 hr tablet Take 0.5 tablets (15 mg total) by mouth daily. 05/04/21  Yes SThurnell Lose MD  mirtazapine (REMERON) 30 MG tablet Take 1 tablet (30 mg total) by mouth at bedtime. 05/10/21  Yes Nafziger, CTommi Rumps NP  Multiple Vitamins-Minerals (PRESERVISION AREDS 2) CAPS Take 1 capsule by mouth 2 (two) times daily.   Yes [provider]  potassium chloride (KLOR-CON) 10 MEQ tablet Take 1 tablet (10 mEq total) by mouth daily as needed (Take only if you are taking Lasix that day). 05/03/21  Yes SThurnell Lose MD  simvastatin (ZOCOR) 10 MG tablet Take 1 tablet (10 mg total) by mouth at bedtime. 10/10/20  Yes Nafziger, CTommi Rumps NP  benzonatate (TESSALON PERLES) 100 MG capsule Take 1 capsule (100 mg total) by mouth 3 (three) times daily as needed for cough. Patient not taking: Reported on 05/11/2021 04/25/21   Panosh, WStandley Brooking MD  budesonide (ENTOCORT EC) 3 MG 24 hr capsule Take 3 capsules (9 mg total) by mouth daily. 11/27/20   DNelida MeuseIII, MD     Vitals:   05/12/21 1000 05/12/21 1243 05/12/21 1338 05/12/21 1630  BP: 106/68 134/73 127/77 (!) 98/52  Pulse: (!) 102 97 94 75  Resp: _0 Temp:      TempSrc:      SpO2: 94%  96% 98%   Exam Gen awake, responds to simple questions, disheveled No rash, cyanosis or gangrene Sclera anicteric, throat clear and dry  No jvd or bruits Chest clear bilat to bases, no rales/ wheezing RRR no MRG Abd soft ntnd no mass or ascites +bs GU normal MS no joint effusions or deformity Ext no LE or UE edema, no wounds or ulcers Neuro as above, nonfocal         Home meds include zyloprim, norvasc, asa, tenormin, chlorthalidone, lasix, proscar, imdur, remeron, MVI, klor-con, zocor, prns     Date   Creat  eGFR    2009- 2019  1.40- 2.40 30- 52    2020   1.80- 1.97 32- 36, stage IIIB   Jan - July 2022  1.70- 2.31 28-35, IIIB   Aug 1- 4,  2022 2.45- 2.87 21- 25, IV   Aug 11- 13  3.00- 3.68   16-18, IV       UA 8/13 - no bact, 0-5 rbc/ wbc/ epi    Renal [place]- 7.7 and 10.3 cm kidneys w/o hydro and w/ normal echo    ECHO  05/01/21 - LVEF 60-65%, G1DD     CXR - IMPRESSION: No edema. Unchanged patchy opacity at the left greater than right lung base. Minimal vague streaky opacity in the right suprahilar lung. Small left pleural effusion.   Assessment/ Plan: AKI on CKD 3B - vs progression to stage IV.  Hx of N/V and looks dry on exam. UA is wnl, R kidney is small, no signs of obstruction. AKI likely due to vol depletion. CKD likely due to HTN'sive renal disease.  Poor HD candidate due to comorbidities, age and FTT.  Looks dry, will rebolus and [place] gtt. Will follow.  Possible HCAP - started on IV abx HTN - BP's soft here, have dc'd norvasc, atenolol and imdur, let BP's come up for better renal perfusion.  Deconditioning H/o COVID infection  - in July 2022 Diast CHF - no signs of vol overload DNR      Kelly Splinter  MD 05/12/2021, 5:40 PM  Recent Labs  Lab 05/11/21 1415 05/12/21 0848  WBC 12.8* 9.9  HGB 14.3 13.5   Recent Labs  Lab 05/11/21 1415 05/12/21 0848  K 4.4 4.3  BUN 106* 100*  CREATININE 3.68* 3.57*  CALCIUM 9.9 9.2

## 2021-05-12 NOTE — ED Notes (Signed)
Report attempted, number left. Unit to return call.

## 2021-05-12 NOTE — ED Notes (Signed)
Pt removed 2nd purewick and monitor again.

## 2021-05-13 DIAGNOSIS — I5032 Chronic diastolic (congestive) heart failure: Secondary | ICD-10-CM | POA: Diagnosis not present

## 2021-05-13 DIAGNOSIS — N179 Acute kidney failure, unspecified: Secondary | ICD-10-CM | POA: Diagnosis not present

## 2021-05-13 DIAGNOSIS — J189 Pneumonia, unspecified organism: Secondary | ICD-10-CM | POA: Diagnosis not present

## 2021-05-13 DIAGNOSIS — I10 Essential (primary) hypertension: Secondary | ICD-10-CM | POA: Diagnosis not present

## 2021-05-13 LAB — CBC WITH DIFFERENTIAL/PLATELET
Abs Immature Granulocytes: 0.1 10*3/uL — ABNORMAL HIGH (ref 0.00–0.07)
Basophils Absolute: 0 10*3/uL (ref 0.0–0.1)
Basophils Relative: 0 %
Eosinophils Absolute: 0.4 10*3/uL (ref 0.0–0.5)
Eosinophils Relative: 6 %
HCT: 36.2 % — ABNORMAL LOW (ref 39.0–52.0)
Hemoglobin: 11.8 g/dL — ABNORMAL LOW (ref 13.0–17.0)
Immature Granulocytes: 1 %
Lymphocytes Relative: 14 %
Lymphs Abs: 1 10*3/uL (ref 0.7–4.0)
MCH: 30.3 pg (ref 26.0–34.0)
MCHC: 32.6 g/dL (ref 30.0–36.0)
MCV: 92.8 fL (ref 80.0–100.0)
Monocytes Absolute: 0.9 10*3/uL (ref 0.1–1.0)
Monocytes Relative: 12 %
Neutro Abs: 4.8 10*3/uL (ref 1.7–7.7)
Neutrophils Relative %: 67 %
Platelets: 219 10*3/uL (ref 150–400)
RBC: 3.9 MIL/uL — ABNORMAL LOW (ref 4.22–5.81)
RDW: 14.6 % (ref 11.5–15.5)
WBC: 7.2 10*3/uL (ref 4.0–10.5)
nRBC: 0 % (ref 0.0–0.2)

## 2021-05-13 LAB — COMPREHENSIVE METABOLIC PANEL
ALT: 18 U/L (ref 0–44)
AST: 29 U/L (ref 15–41)
Albumin: 2 g/dL — ABNORMAL LOW (ref 3.5–5.0)
Alkaline Phosphatase: 23 U/L — ABNORMAL LOW (ref 38–126)
Anion gap: 10 (ref 5–15)
BUN: 96 mg/dL — ABNORMAL HIGH (ref 8–23)
CO2: 16 mmol/L — ABNORMAL LOW (ref 22–32)
Calcium: 8.8 mg/dL — ABNORMAL LOW (ref 8.9–10.3)
Chloride: 117 mmol/L — ABNORMAL HIGH (ref 98–111)
Creatinine, Ser: 3.36 mg/dL — ABNORMAL HIGH (ref 0.61–1.24)
GFR, Estimated: 17 mL/min — ABNORMAL LOW (ref >60)
Glucose, Bld: 92 mg/dL (ref 70–99)
Potassium: 4.4 mmol/L (ref 3.5–5.1)
Sodium: 143 mmol/L (ref 135–145)
Total Bilirubin: 1 mg/dL (ref 0.3–1.2)
Total Protein: 5.2 g/dL — ABNORMAL LOW (ref 6.5–8.1)

## 2021-05-13 LAB — MAGNESIUM: Magnesium: 1.6 mg/dL — ABNORMAL LOW (ref 1.7–2.4)

## 2021-05-13 LAB — PROCALCITONIN: Procalcitonin: 0.28 ng/mL

## 2021-05-13 LAB — TSH: TSH: 2.984 u[IU]/mL (ref 0.350–4.500)

## 2021-05-13 LAB — VITAMIN B12: Vitamin B-12: 728 pg/mL (ref 180–914)

## 2021-05-13 LAB — AMMONIA: Ammonia: 37 umol/L — ABNORMAL HIGH (ref 9–35)

## 2021-05-13 MED ORDER — FOOD THICKENER (SIMPLYTHICK HONEY): 10.0000 | ORAL | Status: DC | PRN

## 2021-05-13 NOTE — Progress Notes (Signed)
[facility] Progress Note  Subjective: no UOP recorded. Creat down 3.3 and BUN 96.  Pt lethargic, but awakens and responds to simple questions/ commands.   Vitals:   05/12/21 2232 05/12/21 2356 05/13/21 0400 05/13/21 0837  BP: 129/69 (!) 86/54 (!) 112/55 130/63  Pulse:  77 74 76  Resp: 20 20 (!) 25 20  Temp:  98.6 F (37 C) 98.9 F (37.2 C) 98.7 F (37.1 C)  TempSrc:  Axillary Axillary Axillary  SpO2:  93% 92% 95%    Exam: Gen responds to simple questions, disheveled No rash, cyanosis or gangrene Sclera anicteric, throat less dry  No jvd or bruits Chest clear bilat to bases RRR no MRG Abd soft ntnd no mass or ascites +bs MS no joint effusions or deformity Ext no LE or UE edema Neuro as above, nonfocal, gen'd weakness and deconditioned              Home meds include zyloprim, norvasc, asa, tenormin, chlorthalidone, lasix, proscar, imdur, remeron, MVI, klor-con, zocor, prns      Date                          Creat               eGFR    2009- 2019               1.40- 2.40        30- 52    2020                         1.80- 1.97        32- 36, stage IIIB   Jan - July 2022         1.70- 2.31        28-35, IIIB   Aug 1- 4, 2022          2.45- 2.87        21- 25, IV   Aug 11- 13                3.00- 3.68        16-18, IV          UA 8/13 - no bact, 0-5 rbc/ wbc/ epi    Renal US - 7.7 and 10.3 cm kidneys w/o hydro and w/ normal echo    ECHO 05/01/21 - LVEF 60-65%, G1DD     CXR - IMPRESSION: No edema. Unchanged patchy opacity at the left greater than right lung base. Minimal vague streaky opacity in the right suprahilar lung. Small left pleural effusion.     Assessment/ Plan: AKI on CKD 3B - vs progression to stage IV.  Recent n/v at home. UA wnl.  R kidney is small but no signs of obstruction on [place]. AKI likely due to vol depletion in setting of 2 diuretics at home, hx diast CHF. Renal function has been steadily declining over the last year and is approaching  stage V CKD, prob some element of cardiorenal syndrome. Making urine, creat down slightly w/ IVF"s will lower rate to 75 cc/hr. Pt is not a HD candidate due to comorbidities/ FTT.  Palliative saw pt / family yesterday and discussed options of hospice vs SNF placement and cont medical Rx. D/w son today, they are leaning towards hospice/ comfort care at home. Will follow.  Possible HCAP - started on IV abx HTN - BP's soft > have  dc'd norvasc, atenolol and imdur. BP's are better today, avoid tight control for now w/ AKI.  Deconditioning H/o COVID infection  - in July 2022 Diast CHF  DNR     Kelly Splinter 05/13/2021, 8:50 AM   Recent Labs  Lab 05/12/21 0848 05/13/21 0015  K 4.3 4.4  BUN 100* 96*  CREATININE 3.57* 3.36*  CALCIUM 9.2 8.8*  HGB 13.5 11.8*   Inpatient medications:  aspirin  81 mg Oral Daily   enoxaparin (LOVENOX) injection  30 mg Subcutaneous Q24H   feeding supplement  237 mL Oral BID BM   finasteride  5 mg Oral Daily   folic acid  1 mg Oral Daily   multivitamin  1 tablet Oral Daily   QUEtiapine  25 mg Oral QHS   simvastatin  10 mg Oral QHS   sodium chloride flush  3 mL Intravenous Q12H    ceFEPime (MAXIPIME) IV 2 g (05/12/21 2237)   sodium bicarbonate 150 mEq in D5W infusion 125 mL/hr at 05/13/21 0501   acetaminophen **OR** acetaminophen, albuterol, haloperidol lactate, polyethylene glycol

## 2021-05-13 NOTE — Evaluation (Signed)
Clinical/Bedside Swallow Evaluation Patient Details  Name: Shane [name] MRN: [id number] Date of Birth: [birth year]  Today's Date: 05/13/2021 Time: SLP Start Time (ACUTE ONLY): 1245 SLP Stop Time (ACUTE ONLY): 6433 SLP Time Calculation (min) (ACUTE ONLY): 20 min  Past Medical History:  Past Medical History:  Diagnosis Date   Anemia, iron deficiency    C. difficile diarrhea    Cancer (HCC)    forearm-  squamous   Cataract    bil cataracts removed   DDD (degenerative disc disease), cervical    Dysplastic nevus of left upper extremity    Elevated PSA    Essential hypertension    Extrinsic asthma    GERD (gastroesophageal reflux disease)    no recent   Gout    Hiatal hernia    Macular degeneration    Spinal stenosis    Past Surgical History:  Past Surgical History:  Procedure Laterality Date   BACK SURGERY     Bone Spur Right    Shoulder   COLONOSCOPY     COLONOSCOPY W/ POLYPECTOMY     COLONOSCOPY WITH PROPOFOL N/A 11/19/2016   Procedure: COLONOSCOPY WITH PROPOFOL;  Surgeon: Doran Stabler, MD;  Location: [facility];  Service: Gastroenterology;  Laterality: N/A;   FECAL TRANSPLANT N/A 11/19/2016   Procedure: FECAL TRANSPLANT;  Surgeon: Doran Stabler, MD;  Location: [facility];  Service: Gastroenterology;  Laterality: N/A;   MOUTH SURGERY     2 teeth removed resulting in a dry socket in one of them   POLYPECTOMY     SKIN CANCER EXCISION     HPI:  87 y.o. male with medical history significant of macular degeneration, iron deficiency anemia, CKD IV, hypertension, C. difficile, asthma, GERD, hyperlipidemia, MGUS, spinal stenosis, squamous cell skin cancer, gout, diastolic heart failure, BPH, recent hospitalization from 04/30/2021-05/03/2021 with acute hypoxic respiratory failure secondary to pneumonia/acute on chronic diastolic heart failure and acute kidney injury treated with antibiotics and Lasix presented with generalized weakness, very poor oral intake, fever, cough  and nausea and vomiting.  CXR 8/12 reported 'Unchanged patchy opacity at the left greater than right lung base."   Assessment / Plan / Recommendation Clinical Impression  Pt was seen for a bedside swallow evaluation in the setting of reported choking with thin liquids and solids.  Pt was encountered awake/alert with son and daughter present at beside.  Family reported that the pt had been coughing/choking with liquids and solids for the past week at home in between his previous and current hospital admissions.  No dysphagia at baseline prior to this.  Pt was seen with trials of nectar-thick liquid, honey-thick liquid and puree.  Pt had recently finished a bite of doughnut prior to SLP arrival and he was noted to have moderate oral residue.  Additionally noted a pale yellow coating on his lingual surface.  Pt was cued to clear oral residue with a liquid wash of honey-thick liquid.  He cleared residue without difficulty.  No overt s/sx of aspiration observed with honey-thick liquid or puree, but a delayed, prolonged cough was noted following nectar-thick liquid trials.  Cough was noted to be weak and congested.  Recommend diet change to Dysphagia 1 (puree) solids and honey-thick liquid with medications administered crushed in puree.  Additionally recommend a modified barium swallow study to further evaluate swallow function.  Pt, family, and RN were made aware.     SLP Visit Diagnosis: Dysphagia, oropharyngeal phase (R13.12)    Aspiration Risk  Moderate  aspiration risk    Diet Recommendation Honey-thick liquid;Dysphagia 1 (Puree)   Liquid Administration via: Cup;Straw Medication Administration: Crushed with puree Supervision: Full supervision/cueing for compensatory strategies;Staff to assist with self feeding Compensations: Slow rate;Small sips/bites Postural Changes: Seated upright at 90 degrees    Other  Recommendations Oral Care Recommendations: Oral care BID;Staff/trained caregiver to provide  oral care Other Recommendations: Have oral suction available   Follow up Recommendations Other (comment) (TBD)      Frequency and Duration min 2x/week  2 weeks       Prognosis Prognosis for Safe Diet Advancement: Fair      Swallow Study   General HPI: 87 y.o. male with medical history significant of macular degeneration, iron deficiency anemia, CKD IV, hypertension, C. difficile, asthma, GERD, hyperlipidemia, MGUS, spinal stenosis, squamous cell skin cancer, gout, diastolic heart failure, BPH, recent hospitalization from 04/30/2021-05/03/2021 with acute hypoxic respiratory failure secondary to pneumonia/acute on chronic diastolic heart failure and acute kidney injury treated with antibiotics and Lasix presented with generalized weakness, very poor oral intake, fever, cough and nausea and vomiting.  CXR 8/12 reported 'Unchanged patchy opacity at the left greater than right lung base." Type of Study: Bedside Swallow Evaluation Previous Swallow Assessment: N/A Diet Prior to this Study: Regular;Thin liquids Temperature Spikes Noted: No Respiratory Status: Room air History of Recent Intubation: No Behavior/Cognition: Alert;Cooperative;Pleasant mood Oral Cavity Assessment: Dry Oral Care Completed by SLP: No Oral Cavity - Dentition: Adequate natural dentition Vision: Functional for self-feeding Self-Feeding Abilities: Needs assist Patient Positioning: Upright in bed Baseline Vocal Quality: Low vocal intensity Volitional Cough: Weak;Congested Volitional Swallow: Able to elicit    Oral/Motor/Sensory Function Overall Oral Motor/Sensory Function: Within functional limits   Ice Chips Ice chips: Not tested   Thin Liquid Thin Liquid: Not tested    Nectar Thick Nectar Thick Liquid: Impaired Presentation: Straw Pharyngeal Phase Impairments: Cough - Delayed   Honey Thick Honey Thick Liquid: Within functional limits   Puree Puree: Within functional limits   Solid     Solid: Not tested      Colin Mulders M.S., [place] Office: [phone]  [place] 05/13/2021,1:15 PM

## 2021-05-13 NOTE — Plan of Care (Signed)
  Problem: Education: Goal: Knowledge of disease and its progression will improve Outcome: Progressing Goal: Individualized Educational Video(s) Outcome: Progressing   Problem: Nutritional: Goal: Ability to make healthy dietary choices will improve Outcome: Progressing

## 2021-05-13 NOTE — Plan of Care (Signed)

## 2021-05-13 NOTE — Progress Notes (Signed)
Patient ID: Shane [name], male   DOB: 1932/04/12, 87 y.o.   MRN: [id number]  PROGRESS NOTE    Shane [name]  YIR:[id number] DOB: 1932-06-04 DOA: 05/11/2021 PCP: Dorothyann Peng, NP   Brief Narrative:  87 y.o. male with medical history significant of macular degeneration, iron deficiency anemia, CKD IV, hypertension, C. difficile, asthma, GERD, hyperlipidemia, MGUS, spinal stenosis, squamous cell skin cancer, gout, diastolic heart failure, BPH, recent hospitalization from 04/30/2021-05/03/2021 with acute hypoxic respiratory failure secondary to pneumonia/acute on chronic diastolic heart failure and acute kidney injury treated with antibiotics and Lasix presented with generalized weakness, very poor oral intake, fever, cough and nausea and vomiting.  On presentation, bicarb was 16, creatinine of 3.68 (baseline of 2-2.5), WBC of 12.8.  Chest x-ray showed stable patchy opacities left greater than right at the base similar to previous admission but new streaky opacity at the right suprahilar region with stable left small pleural effusion.  He was started on IV fluids and antibiotics.  Assessment & Plan:   Acute kidney injury on chronic any disease stage IV Acute metabolic acidosis -Baseline creatinine around 2-2.5.  Creatinine 2.7 to on discharge on 05/03/2021.  Presented with creatinine of 3.68 and bicarb of 16.  Treated with IV fluids. -Creatinine 3.36 today.  Bicarb is improving to 16 today.  Renal ultrasound was negative for hydronephrosis.  Currently on bicarb drip.  Nephrology following.  Strict input and output.  Daily weights.  Probable healthcare associated pneumonia -Chest x-ray on presentation showed possible new streaky opacity.  Patient recently was treated with antibiotics for pneumonia.  Last hospitalization -Currently on cefepime.  Procalcitonin 0.67 on presentation and improving to 0.28 today.   -Currently on room air  Acute metabolic encephalopathy Very poor oral  intake Dehydration Delirium -Patient's oral intake has been very poor since recent hospital discharge with increasing confusion at home.  As per the daughter, patient does not have known diagnosis of dementia -Unclear if the patient has underlying dementia as well.  Hopefully mental status will start improving with treatment for pneumonia and improving and renal function. -Monitor mental status.  Fall precautions. -SLP evaluation.  PT evaluation. -Patient received Haldol again this morning.  Patient was also started on Remeron recently by PCP with no improvement as per the daughter.  Remeron discontinued.  Continue scheduled Seroquel. -Palliative care following.  CODE STATUS has been changed to DNR by palliative care.  Overall prognosis is guarded to poor.  COVID-19 positive -Originally tested positive for COVID-19 on April 14, 2021 and was tested positive again on 05/02/2021.  Patient will not need any more testing.   -Does not need any more isolation  Leukocytosis -Resolved   Hypomagnesemia -Replace.  Repeat a.m. labs  Hypertension -Blood pressure stable and on the lower side.  Continue amlodipine, atenolol, Imdur  Hyperlipidemia -Continue statin  BPH -Continue finasteride  Chronic diastolic heart failure -Recent echo showed EF of 55 to 60% with grade 1 diastolic dysfunction.  Cardiology had recently evaluated him during last hospitalization and recommended outpatient follow-up.  Continue beta-blocker, isosorbide mononitrate.  Lasix and chlorthalidone on hold.  Strict input and output.  Daily weights.    Asthma -Continue albuterol  Gout -Allopurinol on hold in the setting of AKI   DVT prophylaxis: Lovenox Code Status: Full Family Communication: Son at bedside on 05/13/2021 Disposition Plan: Status is: Inpatient  Remains inpatient appropriate because:Inpatient level of care appropriate due to severity of illness  Dispo: The patient is from: Home  Anticipated  d/c is to: Home              Patient currently is not medically stable to d/c.   Difficult to place patient No  Consultants: Nephrology  Procedures: None  Antimicrobials: Cefepime from 05/11/2021 onwards   Subjective: Patient seen and examined at bedside.  Patient received Haldol earlier this morning for agitation as per nursing staff.  No overnight fever, vomiting or seizures reported.   Objective: Vitals:   05/12/21 2038 05/12/21 2232 05/12/21 2356 05/13/21 0400  BP: 98/66 129/69 (!) 86/54 (!) 112/55  Pulse: 79  77 74  Resp: (!) 23 20 20  (!) 25  Temp: 98.7 F (37.1 C)  98.6 F (37 C) 98.9 F (37.2 C)  TempSrc: Oral  Axillary Axillary  SpO2: 95%  93% 92%    Intake/Output Summary (Last 24 hours) at 05/13/2021 0827 Last data filed at 05/13/2021 0300 Gross per 24 hour  Intake 1471.31 ml  Output --  Net 1471.31 ml   There were no vitals filed for this visit.  Examination:  General exam: No distress.  Looks chronically ill and deconditioned.  Currently on room air Respiratory system: Decreased breath sounds at bases bilaterally with some crackles  cardiovascular system: Rate controlled, S1-S2 heard  gastrointestinal system: Abdomen is distended slightly, soft and nontender.  Bowel sounds are heard Extremities: Bilateral lower extremity edema present; no clubbing  Central nervous system: Sleepy, wakes up only slightly, answers a few questions but still very slow.  Poor historian.  No focal neurological deficits.  Moves extremities Skin: No obvious ecchymosis/rashes  psychiatry: Cannot be assessed because of mental status  Data Reviewed: I have personally reviewed following labs and imaging studies  CBC: Recent Labs  Lab 05/10/21 1628 05/11/21 1415 05/12/21 0848 05/13/21 0015  WBC 9.9 12.8* 9.9 7.2  NEUTROABS 6.5 7.9*  --  4.8  HGB 14.1 14.3 13.5 11.8*  HCT 43.4 44.3 42.7 36.2*  MCV 92.6 93.3 94.7 92.8  PLT 336.0 369 247 833    Basic Metabolic Panel: Recent  Labs  Lab 05/10/21 1628 05/11/21 1415 05/12/21 0848 05/13/21 0015  NA 147* 144 146* 143  K 4.7 4.4 4.3 4.4  CL 113* 114* 120* 117*  CO2 18* 16* 12* 16*  GLUCOSE 96 113* 96 92  BUN 98* 106* 100* 96*  CREATININE 3.00* 3.68* 3.57* 3.36*  CALCIUM 9.6 9.9 9.2 8.8*  MG  --   --  1.9 1.6*    GFR: Estimated Creatinine Clearance: 15.8 mL/min (A) (by C-G formula based on SCr of 3.36 mg/dL (H)). Liver Function Tests: Recent Labs  Lab 05/10/21 1628 05/11/21 1415 05/12/21 0848 05/13/21 0015  AST 25 29 32 29  ALT 18 22 21 18   ALKPHOS 33* 31* 26* 23*  BILITOT 0.7 0.6 0.9 1.0  PROT 6.2 6.6 5.9* 5.2*  ALBUMIN 3.2* 2.7* 2.4* 2.0*    No results for input(s): LIPASE, AMYLASE in the last 168 hours. Recent Labs  Lab 05/13/21 0015  AMMONIA 37*   Coagulation Profile: No results for input(s): INR, PROTIME in the last 168 hours. Cardiac Enzymes: No results for input(s): CKTOTAL, CKMB, CKMBINDEX, TROPONINI in the last 168 hours. BNP (last 3 results) No results for input(s): PROBNP in the last 8760 hours. HbA1C: No results for input(s): HGBA1C in the last 72 hours. CBG: No results for input(s): GLUCAP in the last 168 hours. Lipid Profile: No results for input(s): CHOL, HDL, LDLCALC, TRIG, CHOLHDL, LDLDIRECT in the last 72 hours. Thyroid  Function Tests: Recent Labs    05/13/21 0015  TSH 2.984   Anemia Panel: Recent Labs    05/13/21 0015  VITAMINB12 728   Sepsis Labs: Recent Labs  Lab 05/12/21 0848 05/13/21 0015  PROCALCITON 0.25 0.28     No results found for this or any previous visit (from the past 240 hour(s)).       Radiology Studies: DG Chest 2 View  Result Date: 05/11/2021 CLINICAL DATA:  Shortness of breath.  COVID. EXAM: CHEST - 2 VIEW COMPARISON:  Radiograph yesterday. Additional priors reviewed. Chest CT 04/30/2021 FINDINGS: Persistent patchy opacity at the left greater than right lung base. Minimal vague streaky opacity in the right suprahilar lung.  Stable heart size and mediastinal contours. Aortic atherosclerosis. There is a small left pleural effusion. No pulmonary edema. No pneumothorax. Thoracic spondylosis. Widening of the right acromioclavicular joint may be postsurgical. IMPRESSION: Unchanged patchy opacity at the left greater than right lung base. Minimal vague streaky opacity in the right suprahilar lung. Small left pleural effusion. Electronically Signed   By: Keith Rake M.D.   On: 05/11/2021 15:14   US RENAL  Result Date: 05/12/2021 CLINICAL DATA:  Acute kidney injury EXAM: RENAL / URINARY TRACT ULTRASOUND COMPLETE COMPARISON:  None. FINDINGS: Right Kidney: Renal measurements: 7.7 x 4.7 x 5.6 cm = volume: 106 mL. Echogenicity within normal limits. Multiple cysts (less than 10), largest measuring 4.4 cm. No suspicious mass or hydronephrosis. Left Kidney: Renal measurements: 10.3 x 6.5 x 5.4 cm = volume: 187 mL. Echogenicity within normal limits. Multiple cysts (less than 10), largest measuring 1.8 cm. No suspicious mass or hydronephrosis. Bladder: Appears normal for degree of bladder distention. Other: None. IMPRESSION: 1. Bilateral renal cysts. 2. No acute findings.  No hydronephrosis. Electronically Signed   By: Franki Cabot M.D.   On: 05/12/2021 12:04        Scheduled Meds:  aspirin  81 mg Oral Daily   enoxaparin (LOVENOX) injection  30 mg Subcutaneous Q24H   feeding supplement  237 mL Oral BID BM   finasteride  5 mg Oral Daily   folic acid  1 mg Oral Daily   multivitamin  1 tablet Oral Daily   QUEtiapine  25 mg Oral QHS   simvastatin  10 mg Oral QHS   sodium chloride flush  3 mL Intravenous Q12H   Continuous Infusions:  ceFEPime (MAXIPIME) IV 2 g (05/12/21 2237)   sodium bicarbonate 150 mEq in D5W infusion 125 mL/hr at 05/13/21 0501          Kehinde Bowdish Starla Link, MD [facility] 05/13/2021, 8:27 AM

## 2021-05-13 NOTE — Progress Notes (Signed)
Notified by nursing that patient's son, Lanny Hurst, was requesting information on hospice services. Attempted call to [phone], no answer - generic voicemail left with CM contact information.   Manya Silvas, RN CM Transitions of Care [phone]

## 2021-05-13 NOTE — Evaluation (Signed)
Physical Therapy Evaluation Patient Details Name: Shane [name] MRN: [id number] DOB: Nov 15, 1931 Today's Date: 05/13/2021   History of Present Illness  87 y.o. male with medical history significant of macular degeneration, iron deficiency anemia, CKD 3B, hypertension, C. difficile, asthma, GERD, hyperlipidemia, MGUS, spinal stenosis, squamous cell skin cancer, gout, diastolic heart failure, BPH who presents with Acute kidney injury on chronic any disease stage IV  Acute metabolic acidosis, probable health care associated pneumonia, Acute metabolic encephalopathy, recent COVID 19 + July 16, and Aug 3. Discharged from hospital admission Aug 4 and readmited 8/12 for failure to thrive, and worsening cough at home.  Clinical Impression  Pt admitted with above diagnosis. Lethargic but able to participate in evaluation. VSS during assessment, SpO2 88-92% on 2L supplemental O2. Practiced bed mobility with min assist and sit<>stand transfers with mod assist. Did not pivot to recliner due to lethargy at this time. Will update POC pending family wishes for disposition.  Pt currently with functional limitations due to the deficits listed below (see PT Problem List). Pt will benefit from skilled PT to increase their independence and safety with mobility to allow discharge to the venue listed below.       Follow Up Recommendations Home health PT;Supervision/Assistance - 24 hour (Pending progress and family wishes)    Equipment Recommendations  None recommended by PT    Recommendations for Other Services       Precautions / Restrictions Precautions Precautions: Fall;Other (comment) Precaution Comments: watch sats, not on airborne precautions Restrictions Weight Bearing Restrictions: No      Mobility  Bed Mobility Overal bed mobility: Needs Assistance Bed Mobility: Supine to Sit;Sit to Supine     Supine to sit: Min assist Sit to supine: Min assist   General bed mobility comments: Min assist to  facilitate with tactile and VC due to lethargy. Min assist for LEs out of bed, pt able to pull through therapist hand to sit upright on EOB.    Transfers Overall transfer level: Needs assistance Equipment used: Rolling walker (2 wheeled) Transfers: Sit to/from Stand Sit to Stand: Mod assist         General transfer comment: Mod assist for boost to stand from bed, practiced several times, initially with HHA however pts LEs too weak and required more UE support with RW upon standing. Not safe for transfer at this time due to lethargy however pt able to stand approx 30 seconds without LE buckling however shows notable instability requiring assist to remain upright and centered in RW.  Ambulation/Gait                Stairs            Wheelchair Mobility    Modified Rankin (Stroke Patients Only)       Balance Overall balance assessment: Needs assistance Sitting-balance support: Single extremity supported;Feet supported Sitting balance-Leahy Scale: Poor     Standing balance support: Bilateral upper extremity supported Standing balance-Leahy Scale: Poor                               Pertinent Vitals/Pain Pain Assessment: Faces Faces Pain Scale: No hurt Pain Intervention(s): Monitored during session    Home Living Family/patient expects to be discharged to:: Private residence Living Arrangements: Spouse/significant other Available Help at Discharge: Family;Available 24 hours/day Type of Home: House Home Access: Stairs to enter Entrance Stairs-Rails: Psychiatric nurse of Steps: 2 Home Layout: Multi-level;Laundry or work  area in basement;Able to live on main level with bedroom/bathroom Home Equipment: Gilford Rile - 2 wheels;Cane - single point;Bedside commode;Hand held shower head;Shower seat;Wheelchair - manual      Prior Function Level of Independence: Needs assistance   Gait / Transfers Assistance Needed: Has been walking in the home  with generalized supervision  ADL's / Homemaking Assistance Needed: son has been providing up to Mod A for ADL completion from sit/stand.  Family helping with meals, meds, home management, bills and community mobility.  Comments: Ind with ADLs prior to previous admission.     Hand Dominance   Dominant Hand: Right    Extremity/Trunk Assessment   Upper Extremity Assessment Upper Extremity Assessment: Defer to OT evaluation    Lower Extremity Assessment Lower Extremity Assessment: Generalized weakness    Cervical / Trunk Assessment Cervical / Trunk Assessment: Kyphotic  Communication   Communication: No difficulties  Cognition Arousal/Alertness: Lethargic Behavior During Therapy: WFL for tasks assessed/performed Overall Cognitive Status: Impaired/Different from baseline Area of Impairment: Orientation;Awareness;Problem solving;Safety/judgement;Following commands;Memory;Attention                 Orientation Level: Disoriented to;Time;Situation Current Attention Level: Focused Memory: Decreased short-term memory Following Commands: Follows one step commands consistently;Follows one step commands with increased time Safety/Judgement: Decreased awareness of deficits;Decreased awareness of safety Awareness: Intellectual Problem Solving: Slow processing;Requires verbal cues;Difficulty sequencing;Requires tactile cues;Decreased initiation        General Comments General comments (skin integrity, edema, etc.): 88%-92% SpO2 on 2L supplemental O2 during therapy evaluation. Notable coughing throughout. Very lethargic but able to participate.    Exercises     Assessment/Plan    PT Assessment Patient needs continued PT services  PT Problem List Decreased strength;Decreased knowledge of use of DME;Decreased activity tolerance;Decreased safety awareness;Decreased balance;Decreased mobility;Decreased coordination;Cardiopulmonary status limiting activity;Decreased cognition        PT Treatment Interventions DME instruction;Balance training;Gait training;Stair training;Functional mobility training;Patient/family education;Therapeutic activities;Therapeutic exercise    PT Goals (Current goals can be found in the Care Plan section)  Acute Rehab PT Goals Patient Stated Goal: Did not state PT Goal Formulation: Patient unable to participate in goal setting Time For Goal Achievement: 05/27/21 Potential to Achieve Goals: Fair    Frequency Min 3X/week   Barriers to discharge        Co-evaluation               AM-PAC PT "6 Clicks" Mobility  Outcome Measure Help needed turning from your back to your side while in a flat bed without using bedrails?: A Little Help needed moving from lying on your back to sitting on the side of a flat bed without using bedrails?: A Little Help needed moving to and from a bed to a chair (including a wheelchair)?: A Lot Help needed standing up from a chair using your arms (e.g., wheelchair or bedside chair)?: A Lot Help needed to walk in hospital room?: A Lot Help needed climbing 3-5 steps with a railing? : Total 6 Click Score: 13    End of Session Equipment Utilized During Treatment: Oxygen Activity Tolerance: Patient limited by lethargy Patient left: with call bell/phone within reach;with family/visitor present;in bed (HOB elevated > 30 deg) Nurse Communication: Mobility status PT Visit Diagnosis: Unsteadiness on feet (R26.81);Muscle weakness (generalized) (M62.81);Difficulty in walking, not elsewhere classified (R26.2);History of falling (Z91.81);Adult, failure to thrive (R62.7)    Time: 1740-8144 PT Time Calculation (min) (ACUTE ONLY): 26 min   Charges:   PT Evaluation $PT Eval Moderate Complexity: 1 Mod  PT Treatments $Therapeutic Activity: 8-22 mins        Ellouise Newer, PT, DPT  Ellouise Newer 05/13/2021, 4:08 PM

## 2021-05-13 NOTE — Progress Notes (Signed)
Initial Nutrition Assessment  DOCUMENTATION CODES:  Not applicable  INTERVENTION:  Continue current diet as order per SLP Discontinue ensure enlive and start Magic cup TID with meals, each supplement provides 290 kcal and 9 grams of protein Continue MVI with minerals daily  NUTRITION DIAGNOSIS:  Inadequate oral intake related to dysphagia, poor appetite as evidenced by meal completion < 25%, per patient/family report.  GOAL:  Patient will meet greater than or equal to 90% of their needs  MONITOR:  PO intake, Supplement acceptance, Weight trends  REASON FOR ASSESSMENT:  Consult Assessment of nutrition requirement/status  ASSESSMENT:  88 y.o. male presented to ED with concern for failure to thrive and worsening cough at home. Recently admitted 8/1-8/4 with COVID19 pneumonia. Daughter reports poor PO intake since that time. PMH includes  HTN, CKD3, CHF, HLD, GERD, and gout.  In ED, pt found to have worsening kidney function and imaging showed stable bilateral patchy opacities but new streaky opacity at the right suprahilar region with stable left small pleural effusion. Nephrology consulted and felt AKI likely related to poor PO and dehydration.   Noted palliative care consulting on pt and family/pt both confirm they would not want a feeding tube.   Unable to obtain a nutrition hx from pt today, daughter notes worsening confusion at home but no hx of dementia. Agitated this AM and received haldol.   Limited meal intakes recorded and no weights have been obtained this admission despite order for daily weights but it appears that pt has been losing over the last 6 months (~12% if accurate which is severe, 2/17-8/1).   Pt has refused both ensures today, will adjust supplements for better acceptance. SLP evaluated pt this AM and adjusted diet to DYS 1 with honey thick liquids. Planned to perform MBS.  Average Meal Intake: 8/12-814: 10% intake x 1 recorded meal  Nutritionally Relevant  Medications: Scheduled Meds:  feeding supplement  237 mL Oral BID BM   folic acid  1 mg Oral Daily   multivitamin  1 tablet Oral Daily   simvastatin  10 mg Oral QHS   Continuous Infusions:  ceFEPime (MAXIPIME) IV 2 g (05/12/21 2237)   sodium bicarbonate 150 mEq in D5W infusion 75 mL/hr at 05/13/21 1327   PRN Meds: food thickener, polyethylene glycol  Labs Reviewed: BUN 96, creatinine 3.36 Mg 1.6  NUTRITION - FOCUSED PHYSICAL EXAM: Defer to in-person assessment  Diet Order:   Diet Order             DIET - DYS 1 Room service appropriate? Yes with Assist; Fluid consistency: Honey Thick  Diet effective now                   EDUCATION NEEDS:  No education needs have been identified at this time  Skin:  Skin Assessment: Reviewed RN Assessment  Last BM:  8/13 - type 7  Height:  Ht Readings from Last 1 Encounters:  05/10/21 5\' 9"  (1.753 m)    Weight:  Wt Readings from Last 1 Encounters:  04/30/21 80.3 kg    Ideal Body Weight:  72.7 kg  BMI:  There is no height or weight on file to calculate BMI.  Estimated Nutritional Needs:  Kcal:  1800-2000 kcal/d Protein:  90-100 g/d Fluid:  2L/d   Ranell Patrick, RD, LDN Clinical Dietitian Pager on [place]

## 2021-05-14 ENCOUNTER — Inpatient Hospital Stay (HOSPITAL_COMMUNITY): Payer: Medicare HMO

## 2021-05-14 DIAGNOSIS — N179 Acute kidney failure, unspecified: Secondary | ICD-10-CM | POA: Diagnosis not present

## 2021-05-14 DIAGNOSIS — I5032 Chronic diastolic (congestive) heart failure: Secondary | ICD-10-CM | POA: Diagnosis not present

## 2021-05-14 DIAGNOSIS — J189 Pneumonia, unspecified organism: Secondary | ICD-10-CM | POA: Diagnosis not present

## 2021-05-14 DIAGNOSIS — N1832 Chronic kidney disease, stage 3b: Secondary | ICD-10-CM | POA: Diagnosis not present

## 2021-05-14 DIAGNOSIS — R5381 Other malaise: Secondary | ICD-10-CM | POA: Diagnosis not present

## 2021-05-14 LAB — CBC WITH DIFFERENTIAL/PLATELET
Abs Immature Granulocytes: 0.09 10*3/uL — ABNORMAL HIGH (ref 0.00–0.07)
Basophils Absolute: 0 10*3/uL (ref 0.0–0.1)
Basophils Relative: 0 %
Eosinophils Absolute: 0.4 10*3/uL (ref 0.0–0.5)
Eosinophils Relative: 4 %
HCT: 37.9 % — ABNORMAL LOW (ref 39.0–52.0)
Hemoglobin: 12.3 g/dL — ABNORMAL LOW (ref 13.0–17.0)
Immature Granulocytes: 1 %
Lymphocytes Relative: 11 %
Lymphs Abs: 1 10*3/uL (ref 0.7–4.0)
MCH: 29.9 pg (ref 26.0–34.0)
MCHC: 32.5 g/dL (ref 30.0–36.0)
MCV: 92.2 fL (ref 80.0–100.0)
Monocytes Absolute: 1.2 10*3/uL — ABNORMAL HIGH (ref 0.1–1.0)
Monocytes Relative: 13 %
Neutro Abs: 6.9 10*3/uL (ref 1.7–7.7)
Neutrophils Relative %: 71 %
Platelets: 194 10*3/uL (ref 150–400)
RBC: 4.11 MIL/uL — ABNORMAL LOW (ref 4.22–5.81)
RDW: 14.5 % (ref 11.5–15.5)
WBC: 9.7 10*3/uL (ref 4.0–10.5)
nRBC: 0 % (ref 0.0–0.2)

## 2021-05-14 LAB — BASIC METABOLIC PANEL
Anion gap: 9 (ref 5–15)
BUN: 80 mg/dL — ABNORMAL HIGH (ref 8–23)
CO2: 23 mmol/L (ref 22–32)
Calcium: 8.8 mg/dL — ABNORMAL LOW (ref 8.9–10.3)
Chloride: 112 mmol/L — ABNORMAL HIGH (ref 98–111)
Creatinine, Ser: 2.78 mg/dL — ABNORMAL HIGH (ref 0.61–1.24)
GFR, Estimated: 21 mL/min — ABNORMAL LOW (ref >60)
Glucose, Bld: 110 mg/dL — ABNORMAL HIGH (ref 70–99)
Potassium: 4.1 mmol/L (ref 3.5–5.1)
Sodium: 144 mmol/L (ref 135–145)

## 2021-05-14 LAB — MAGNESIUM: Magnesium: 1.5 mg/dL — ABNORMAL LOW (ref 1.7–2.4)

## 2021-05-14 MED ORDER — ONDANSETRON HCL 4 MG/2ML IJ SOLN: 4.0000 mg | Freq: Four times a day (QID) | INTRAMUSCULAR | Status: DC | PRN

## 2021-05-14 MED ORDER — HYDROMORPHONE HCL 1 MG/ML IJ SOLN: 0.5000 mg | INTRAMUSCULAR | Status: DC | PRN

## 2021-05-14 MED ORDER — ONDANSETRON 4 MG PO TBDP: 4.0000 mg | ORAL_TABLET | Freq: Four times a day (QID) | ORAL | Status: DC | PRN

## 2021-05-14 MED ORDER — GLYCOPYRROLATE 0.2 MG/ML IJ SOLN
0.4000 mg | INTRAMUSCULAR | Status: DC | PRN
  Administered 2021-05-15: 0.4 mg via INTRAVENOUS
  Filled 2021-05-14: qty 2

## 2021-05-14 MED ORDER — POLYVINYL ALCOHOL 1.4 % OP SOLN
1.0000 [drp] | Freq: Four times a day (QID) | OPHTHALMIC | Status: DC | PRN
  Filled 2021-05-14: qty 15

## 2021-05-14 MED ORDER — BIOTENE DRY MOUTH MT LIQD: 15.0000 mL | OROMUCOSAL | Status: DC | PRN

## 2021-05-14 MED ORDER — GLYCOPYRROLATE 0.2 MG/ML IJ SOLN: 0.4000 mg | INTRAMUSCULAR | Status: DC | PRN

## 2021-05-14 MED ORDER — QUETIAPINE FUMARATE 50 MG PO TABS: 50.0000 mg | ORAL_TABLET | Freq: Every day | ORAL | Status: DC

## 2021-05-14 MED ORDER — LORAZEPAM 2 MG/ML PO CONC
1.0000 mg | ORAL | Status: DC | PRN
  Filled 2021-05-14: qty 0.5

## 2021-05-14 MED ORDER — GLYCOPYRROLATE 1 MG PO TABS
2.0000 mg | ORAL_TABLET | ORAL | Status: DC | PRN
  Filled 2021-05-14: qty 2

## 2021-05-14 MED ORDER — LORAZEPAM 2 MG/ML IJ SOLN
1.0000 mg | INTRAMUSCULAR | Status: DC | PRN
  Administered 2021-05-14 – 2021-05-15 (×3): 1 mg via INTRAVENOUS
  Filled 2021-05-14 (×3): qty 1

## 2021-05-14 NOTE — TOC Initial Note (Signed)
Transition of Care [facility]) - Initial/Assessment Note    Patient Details  Name: Shane [name] MRN: [id number] Date of Birth: [birth year]  Transition of Care [facility]) CM/SW Contact:    Benard Halsted, LCSW Phone Number: 05/14/2021, 1:42 PM  Clinical Narrative:                 11am-CSW received request for patient to go home with [place]. Referral sent and [facility] will evaluate patient.  1:40pm-Hospice has ordered DME for the home and patient will be able to discharge in the morning via PTAR. CSW confirmed address on Facesheet with patient's daughter, Claiborne Billings.   Expected Discharge Plan: Home w Hospice Care Barriers to Discharge: Continued Medical Work up   Patient Goals and CMS Choice Patient states their goals for this hospitalization and ongoing recovery are:: comfort at home CMS Medicare.gov Compare Post Acute Care list provided to:: Patient Represenative (must comment) Choice offered to / list presented to : Adult Children  Expected Discharge Plan and Services Expected Discharge Plan: Home w Hospice Care In-house Referral: Clinical Social Work   Post Acute Care Choice: Hospice Living arrangements for the past 2 months: [place]: Hospice and [place] Date [place]: 05/14/21   Representative spoke with at [place]: [place]  Prior Living Arrangements/Services Living arrangements for the past 2 months: [place] Lives with:: Spouse Patient language and need for interpreter reviewed:: Yes Do you feel safe going back to the place where you live?: Yes      Need for Family Participation in Patient Care: Yes (Comment) Care giver support system in place?: Yes (comment)   Criminal Activity/Legal Involvement Pertinent to Current Situation/Hospitalization: No - Comment as needed  Activities of Daily Living Home Assistive Devices/Equipment: Walker (specify type) ADL Screening (condition at  time of admission) Patient's cognitive ability adequate to safely complete daily activities?: Yes Is the patient deaf or have difficulty hearing?: Yes Does the patient have difficulty seeing, even when wearing glasses/contacts?: Yes Does the patient have difficulty concentrating, remembering, or making decisions?: Yes Patient able to express need for assistance with ADLs?: Yes Does the patient have difficulty dressing or bathing?: Yes Independently performs ADLs?: No Communication: Needs assistance Dressing (OT): Needs assistance Grooming: Needs assistance Feeding: Needs assistance Bathing: Needs assistance Toileting: Needs assistance Is this a change from baseline?: Change from baseline, expected to last >3days In/Out Bed: Needs assistance Is this a change from baseline?: Change from baseline, expected to last <3 days Walks in Home: Needs assistance Does the patient have difficulty walking or climbing stairs?: Yes Weakness of Legs: Both Weakness of Arms/Hands: Both  Permission Sought/Granted Permission sought to share information with : Facility Sport and exercise psychologist, Family Supports Permission granted to share information with : Yes, Verbal Permission Granted  Share Information with NAME: Mary/Kelly  Permission granted to share info w AGENCY: Hospice  Permission granted to share info w Relationship: Spouse/Daughter  Permission granted to share info w Contact Information: [phone]  Emotional Assessment Appearance:: Appears stated age Attitude/Demeanor/Rapport: Unable to Assess Affect (typically observed): Unable to Assess Orientation: : Oriented to Self, Oriented to Place Alcohol / Substance Use: Not Applicable Psych Involvement: No (comment)  Admission diagnosis:  Dehydration [E86.0] Healthcare-associated pneumonia [J18.9] AKI (acute kidney injury) ([place]) [N17.9] Acute renal failure superimposed on stage 3b  chronic kidney disease ([place]) [N17.9, N18.32] Patient Active  Problem List   Diagnosis Date Noted   Goals of care, counseling/discussion    Acute renal failure superimposed on stage 3b chronic kidney disease ([place]) 05/11/2021   BPH (benign prostatic hyperplasia) 05/11/2021   HCAP (healthcare-associated pneumonia) 05/11/2021   Physical deconditioning 05/11/2021   Chronic diastolic CHF (congestive heart failure) ([place]) 05/11/2021   Hypoxia 04/30/2021   Pneumonia due to COVID-19 virus    Elevated troponin I level    AKI (acute kidney injury) ([place])    MGUS (monoclonal gammopathy of unknown significance) 11/16/2020   Advanced nonexudative age-related macular degeneration of left eye with subfoveal involvement 06/19/2020   Left epiretinal membrane 06/19/2020   Advanced nonexudative age-related macular degeneration of right eye with subfoveal involvement 06/19/2020   Exudative age-related macular degeneration of right eye with inactive choroidal neovascularization ([place]) 06/19/2020   Chronic kidney disease (CKD), stage III (moderate) ([place]) 06/16/2019   Clostridium difficile infection    Hypertriglyceridemia 12/01/2015   EXTRINSIC ASTHMA, UNSPECIFIED 01/11/2009   SPINAL STENOSIS 05/24/2008   PROSTATE SPECIFIC ANTIGEN, ELEVATED 05/24/2008   OBESITY 08/13/2006   ANEMIA-IRON DEFICIENCY 08/13/2006   Essential hypertension 08/13/2006   ALLERGIC RHINITIS 08/13/2006   GERD 08/13/2006   HIATAL HERNIA 08/13/2006   DEGENERATIVE JOINT DISEASE 08/13/2006   PCP:  Dorothyann Peng, NP Pharmacy:   [facility] - [place], [place] - [address] [place] [address] Phone: [phone] Fax: [phone]  [id number] - [place], [place] - [address] [place] [address] Phone: [phone] Fax: [phone]     Social Determinants of Health (SDOH) Interventions    Readmission Risk Interventions Readmission Risk Prevention Plan 05/14/2021   Transportation Screening Complete  Medication Review ([place]) Complete  PCP or Specialist appointment within 3-5 days of discharge Complete  HRI or Home Care Consult Complete  SW Recovery Care/Counseling Consult Complete  Palliative Care Screening Complete  [place] Not Applicable  Some recent data might be hidden

## 2021-05-14 NOTE — Progress Notes (Signed)
Manufacturing engineer [facility]) hospital liaison note:  Received referral from [facility] for hospice services at home.  Pt and chart under review by [facility] MD.  Hospice eligibility pending at this time.  Met at bedside and then privately with pt's spouse, son and daughter.  Fa,ily shared recent rapid decline.  Education offered on hospice services at home; family verbalized understanding.  Per discussion pt will discharge home tomorrow via PTAR.  Please send signed DNR with pt.  Please send medications at discharge to ensure comfort until pt can be admitted onto hospice.  DME needs discussed.  Pt currently has a transport chair.  Pt will need: hospital bed, OBT, and PRN O2.  These have been ordered for delivery this afternoon.  Pt's son Lanny Hurst is the contact person and can be reached at [phone].  Thank you for the opportunity to participate in this pt's care.  Domenic Moras, BSN, RN [facility] hospital liaison [phone] (24h on call)

## 2021-05-14 NOTE — TOC Progression Note (Signed)
Transition of Care [facility]) - Progression Note    Patient Details  Name: Shane [name] MRN: [id number] Date of Birth: 20-Dec-1931  Transition [facility] Care [facility]) CM/SW Contact  Bartholomew Crews, RN Phone Number: [phone] 05/14/2021, 4:27 PM  Clinical Narrative:     Spoke with son, Lanny Hurst, at the bedside to follow up with nursing request for hospice information. Lanny Hurst advised that he had already spoken with palliative and hospice.   Noted CSW and hospice notes.  Expected Discharge Plan: Home w Hospice Care Barriers to Discharge: Continued Medical Work up  Expected Discharge Plan and Services Expected Discharge Plan: [place] In-house Referral: Clinical Social Work   Post Acute Care Choice: Hospice Living arrangements for the past 2 months: [place]: Hospice and [place] Date [place]: 05/14/21   Representative spoke with at [place]: [place] ([place]) Interventions    Readmission Risk Interventions Readmission Risk Prevention Plan 05/14/2021  Transportation Screening Complete  Medication Review Press photographer) Complete  PCP or Specialist appointment within 3-5 days of discharge Complete  HRI or Home Care Consult Complete  SW Recovery Care/Counseling Consult Complete  [place] Not Applicable  Some recent data might be hidden

## 2021-05-14 NOTE — Progress Notes (Signed)
  Speech Language Pathology Treatment: Dysphagia  Patient Details Name: Shane [name] MRN: [id number] DOB: [birth year] Today's Date: 05/14/2021 Time: 1751-0258 SLP Time Calculation (min) (ACUTE ONLY): 25 min  Assessment / Plan / Recommendation Clinical Impression  Skilled therapy provided for pt's current swallow ability and education/discussion with wife and daughter re: goals for nutrition. Plan is for discharge today with home hospice. Wife feeding ice cream on therapists arrival and pt noted to have wet vocal quality and continuous cough appearing related to po intake. [place] assisted in expectoration. Education needed to allow pt recovery if coughing with po's and not continue feeding as SLP observed with wife. Daughter verbalized understanding. Reviewed consistencies thin versus thick in light of hospice and daughter prefers the honey thick as it had decreased his cough with liquids. Provided with Simply thick gel sample packets and gave info for ordering additional and/or drugstore purchase of powder thickener.  They are satisfied with puree and want to continue at discharge. Questions answered. No follow up tx needed at this time.    HPI HPI: 87 y.o. male with medical history significant of macular degeneration, iron deficiency anemia, CKD IV, hypertension, C. difficile, asthma, GERD, hyperlipidemia, MGUS, spinal stenosis, squamous cell skin cancer, gout, diastolic heart failure, BPH, recent hospitalization from 04/30/2021-05/03/2021 with acute hypoxic respiratory failure secondary to pneumonia/acute on chronic diastolic heart failure and acute kidney injury treated with antibiotics and Lasix presented with generalized weakness, very poor oral intake, fever, cough and nausea and vomiting.  CXR 8/12 reported 'Unchanged patchy opacity at the left greater than right lung base."      SLP Plan  Other (Comment) (D/C to hospice today)       Recommendations  Diet recommendations: Honey-thick  liquid;Dysphagia 1 (puree) Liquids provided via: Cup Medication Administration: Crushed with puree Supervision: Patient able to self feed;Staff to assist with self feeding;Full supervision/cueing for compensatory strategies Compensations: Slow rate;Small sips/bites;Clear throat intermittently Postural Changes and/or Swallow Maneuvers: Seated upright 90 degrees                Oral Care Recommendations: Oral care BID Follow up Recommendations: None SLP Visit Diagnosis: Dysphagia, oropharyngeal phase (R13.12) Plan: Other (Comment) (D/C to hospice today)       GO                Houston Siren 05/14/2021, 2:47 PM

## 2021-05-14 NOTE — Progress Notes (Signed)
Daily Progress Note   Patient Name: Shane [name]       Date: 05/14/2021 DOB: September 29, 1932  Age: 87 y.o. MRN#: [id number] Attending Physician: Aline August, MD Primary Care Physician: Dorothyann Peng, NP Admit Date: 05/11/2021  Reason for Consultation/Follow-up: Establishing goals of care  Subjective: Medical records reviewed. Patient assessed at the beside and is resting comfortably. Per RN, he is agitated more frequently at night.   Received a call from patient's son Lanny Hurst. Created space and opportunity for his thoughts and feelings on patient's current illness since our initial goals of care conversation.  He shares that he is feeling numb.  He feels as though they are just watching him die and he would like to further discuss hospice.  Lanny Hurst reiterates how important it is for the patient to be at home as long as possible.  He understands that patient may have 1 or 2 weeks to live after discussions with nephrology.  Patient has also made comments such as "this is the end" and seems to be aware as well.  Patient would want to know his prognosis and his other son will be arriving in town tonight.  Family plans to discuss with him further together, as they do not want him to be surprised by DME deliveries when he returns home.  Counseled on the importance of validation and reassurance during this conversation.  Reviewed hospice services and the process of discharging home. Family would prefer to have a hospice RN present upon arrival and are willing to wait until tomorrow.   Questions and concerns addressed. PMT will continue to support holistically.   Length of Stay: 2  Current Medications: Scheduled Meds:   finasteride  5 mg Oral Daily   QUEtiapine  50 mg Oral QHS   simvastatin  10 mg Oral  QHS   sodium chloride flush  3 mL Intravenous Q12H    Continuous Infusions:  ceFEPime (MAXIPIME) IV 2 g (05/13/21 1721)   sodium bicarbonate 150 mEq in D5W infusion 50 mL/hr at 05/14/21 0213    PRN Meds: acetaminophen **OR** acetaminophen, albuterol, food thickener, haloperidol lactate, polyethylene glycol  Physical Exam Vitals and nursing note reviewed.  Constitutional:      General: He is sleeping. He is not in acute distress.    Appearance: He is ill-appearing.  Cardiovascular:  Rate and Rhythm: Normal rate.  Pulmonary:     Effort: Pulmonary effort is normal.            Vital Signs: BP 122/74 (BP Location: Right Arm)   Pulse 89   Temp 98 F (36.7 C) (Axillary)   Resp 20   Wt 79 kg   SpO2 92%   BMI 25.72 kg/m  SpO2: SpO2: 92 % O2 Device: O2 Device: Room Air O2 Flow Rate:    Intake/output summary:  Intake/Output Summary (Last 24 hours) at 05/14/2021 1058 Last data filed at 05/14/2021 0815 Gross per 24 hour  Intake --  Output 800 ml  Net -800 ml   LBM: Last BM Date: 05/12/21 Baseline Weight: Weight: 79 kg Most recent weight: Weight: 79 kg       Palliative Assessment/Data:      Patient Active Problem List   Diagnosis Date Noted   Goals of care, counseling/discussion    Acute renal failure superimposed on stage 3b chronic kidney disease ([place]) 05/11/2021   BPH (benign prostatic hyperplasia) 05/11/2021   HCAP (healthcare-associated pneumonia) 05/11/2021   Physical deconditioning 05/11/2021   Chronic diastolic CHF (congestive heart failure) ([place]) 05/11/2021   Hypoxia 04/30/2021   Pneumonia due to COVID-19 virus    Elevated troponin I level    AKI (acute kidney injury) (H[facility])    MGUS (monoclonal gammopathy of unknown significance) 11/16/2020   Advanced nonexudative age-related macular degeneration of left eye with subfoveal involvement 06/19/2020   Left epiretinal membrane 06/19/2020   Advanced nonexudative age-related macular degeneration of right eye  with subfoveal involvement 06/19/2020   Exudative age-related macular degeneration of right eye with inactive choroidal neovascularization ([place]) 06/19/2020   Chronic kidney disease (CKD), stage III (moderate) ([place]) 06/16/2019   Clostridium difficile infection    Hypertriglyceridemia 12/01/2015   EXTRINSIC ASTHMA, UNSPECIFIED 01/11/2009   SPINAL STENOSIS 05/24/2008   PROSTATE SPECIFIC ANTIGEN, ELEVATED 05/24/2008   OBESITY 08/13/2006   ANEMIA-IRON DEFICIENCY 08/13/2006   Essential hypertension 08/13/2006   ALLERGIC RHINITIS 08/13/2006   GERD 08/13/2006   HIATAL HERNIA 08/13/2006   DEGENERATIVE JOINT DISEASE 08/13/2006    Palliative Care Assessment & Plan   Patient Profile: 87 y.o. male  with past medical history of  macular degeneration, iron deficiency anemia, CKD 3B, hypertension, C. difficile, asthma, GERD, hyperlipidemia, MGUS, spinal stenosis, squamous cell skin cancer, gout, diastolic heart failure, BPH admitted on 05/11/2021 with weakness, decreased PO intake, nausea, vomiting, cough, chills.    Patient recently admitted in August for respiratory failure due to a combination of community-acquired pneumonia and diastolic CHF. ED work up showed progression of chronic kidney disease. Palliative medicine has been consulted to assist with goals of care conversation.    Assessment: Goals of care conversation AKI on CKD4, progressing  Recommendations/Plan: Transition to comfort care Dilaudid PRN for pain/air hunger/comfort Robinul PRN for excessive secretions Ativan PRN for agitation/anxiety Zofran PRN for nausea Liquifilm tears PRN for dry eyes Haldol PRN for agitation/anxiety May have comfort feeding Comfort cart for family Unrestricted visitations in the setting of EOL (per policy) Oxygen PRN 2L or less for comfort. No escalation.   Family has decided to go home with hospice, discussed with [facility]. Will discharge tomorrow Psychosocial and emotional support provided  Goals of  Care and Additional Recommendations: Limitations on Scope of Treatment: Full Comfort Care   Prognosis:  < 2 weeks  Discharge Planning: Home with Hospice  Care plan was discussed with patient's son/HCPOA Lanny Hurst RN, [facility] Liaison Chrislyn Arrowsmith  Total time: 35 minutes  Greater than 50% of this time was spent in counseling and coordinating care related to the above assessment and plan.  Dorthy Cooler, PA-C Palliative Medicine Team Team phone # [phone]  Thank you for allowing the Palliative Medicine Team to assist in the care of this patient. Please utilize secure chat with additional questions, if there is no response within 30 minutes please call the above phone number.  Palliative Medicine Team providers are available by phone from 7am to 7pm daily and can be reached through the team cell phone.  Should this patient require assistance outside of these hours, please call the patient's attending physician.

## 2021-05-14 NOTE — Progress Notes (Signed)
Palliative Medicine RN Note: Rec'd a call from pt's Rickard Patience [phone]), asking to speak to our [place]. They have decided to go home w hospice. They will be going to  [address] [place], [place] [address]  They have elected to use [facility], aka [facility] (previously [facility] and [place]). Called referral to [place] w [place], who will come to the office to get the information.  Marjie Skiff Stepan Verrette, RN, BSN, [facility] Palliative Medicine Team 05/14/2021 11:01 AM Office [phone]

## 2021-05-14 NOTE — Progress Notes (Addendum)
Patient ID: Shane [name], male   DOB: [birth year], 87 y.o.   MRN: [id number] [facility] Progress Note   Assessment/ Plan:   1. Acute kidney Injury on chronic kidney disease stage IIIb versus progression to stage IV: This appears to be possibly hemodynamically mediated and some improvement of renal function noted overnight but overall prognosis remains guarded especially in the setting of cardiorenal syndrome/diastolic heart failure.  Agree with discontinuation of intravenous fluids and discharged home with home hospice as dialysis candidacy is poor. 2.  Possible healthcare associated pneumonia: Recent history of COVID infection and chest x-ray on admission showing streaky opacity for which she was empirically started on cefepime.  Oxygenating well. 3.  Anion gap metabolic acidosis: Secondary to acute kidney injury and improved with sodium bicarbonate supplementation. 4.  Acute metabolic encephalopathy: Appears to be multifactorial; plans noted for transition home with home hospice.  Nephrology service will sign off and remain available for questions or concerns.  Subjective:   Complains of being uncomfortable in bed and assisted with repositioning.   Objective:   BP 122/74 (BP Location: Right Arm)   Pulse 89   Temp 98 F (36.7 C) (Axillary)   Resp 20   Wt 79 kg   SpO2 92%   BMI 25.72 kg/m   Intake/Output Summary (Last 24 hours) at 05/14/2021 1102 Last data filed at 05/14/2021 0815 Gross per 24 hour  Intake --  Output 800 ml  Net -800 ml   Weight change:   Physical Exam: Gen: Resting uncomfortably in bed, wife and daughter-in-law at bedside. CVS: Pulse regular rhythm, normal rate, S1 and S2 normal Resp: Coarse/transmitted breath sounds bilaterally, no distinct rales or rhonchi Abd: Soft, obese, nontender, bowel sounds normal Ext: No lower extremity edema  Imaging: US RENAL  Result Date: 05/12/2021 CLINICAL DATA:  Acute kidney injury EXAM: RENAL / URINARY TRACT  ULTRASOUND COMPLETE COMPARISON:  None. FINDINGS: Right Kidney: Renal measurements: 7.7 x 4.7 x 5.6 cm = volume: 106 mL. Echogenicity within normal limits. Multiple cysts (less than 10), largest measuring 4.4 cm. No suspicious mass or hydronephrosis. Left Kidney: Renal measurements: 10.3 x 6.5 x 5.4 cm = volume: 187 mL. Echogenicity within normal limits. Multiple cysts (less than 10), largest measuring 1.8 cm. No suspicious mass or hydronephrosis. Bladder: Appears normal for degree of bladder distention. Other: None. IMPRESSION: 1. Bilateral renal cysts. 2. No acute findings.  No hydronephrosis. Electronically Signed   By: Franki Cabot M.D.   On: 05/12/2021 12:04    Labs: BMET Recent Labs  Lab 05/10/21 1628 05/11/21 1415 05/12/21 0848 05/13/21 0015 05/14/21 0038  NA 147* 144 146* 143 144  K 4.7 4.4 4.3 4.4 4.1  CL 113* 114* 120* 117* 112*  CO2 18* 16* 12* 16* 23  GLUCOSE 96 113* 96 92 110*  BUN 98* 106* 100* 96* 80*  CREATININE 3.00* 3.68* 3.57* 3.36* 2.78*  CALCIUM 9.6 9.9 9.2 8.8* 8.8*   CBC Recent Labs  Lab 05/10/21 1628 05/11/21 1415 05/12/21 0848 05/13/21 0015 05/14/21 0038  WBC 9.9 12.8* 9.9 7.2 9.7  NEUTROABS 6.5 7.9*  --  4.8 6.9  HGB 14.1 14.3 13.5 11.8* 12.3*  HCT 43.4 44.3 42.7 36.2* 37.9*  MCV 92.6 93.3 94.7 92.8 92.2  PLT 336.0 369 247 219 194    Medications:     finasteride  5 mg Oral Daily   QUEtiapine  50 mg Oral QHS   simvastatin  10 mg Oral QHS   sodium chloride flush  3 mL Intravenous Q12H      Elmarie Shiley, MD 05/14/2021, 11:02 AM

## 2021-05-14 NOTE — Progress Notes (Signed)
Patient ID: Shane [name], male   DOB: 05/04/1932, 87 y.o.   MRN: [id number]  PROGRESS NOTE    Shane [name]  ELF:[id number] DOB: 04/09/1932 DOA: 05/11/2021 PCP: Dorothyann Peng, NP   Brief Narrative:  87 y.o. male with medical history significant of macular degeneration, iron deficiency anemia, CKD IV, hypertension, C. difficile, asthma, GERD, hyperlipidemia, MGUS, spinal stenosis, squamous cell skin cancer, gout, diastolic heart failure, BPH, recent hospitalization from 04/30/2021-05/03/2021 with acute hypoxic respiratory failure secondary to pneumonia/acute on chronic diastolic heart failure and acute kidney injury treated with antibiotics and Lasix presented with generalized weakness, very poor oral intake, fever, cough and nausea and vomiting.  On presentation, bicarb was 16, creatinine of 3.68 (baseline of 2-2.5), WBC of 12.8.  Chest x-ray showed stable patchy opacities left greater than right at the base similar to previous admission but new streaky opacity at the right suprahilar region with stable left small pleural effusion.  He was started on IV fluids and antibiotics.  Assessment & Plan:   Acute kidney injury on chronic any disease stage IV Acute metabolic acidosis -Baseline creatinine around 2-2.5.  Creatinine 2.7 to on discharge on 05/03/2021.  Presented with creatinine of 3.68 and bicarb of 16.  Treated with IV fluids. -Creatinine 2.78 today.  Bicarb has improved to 23 today.  Renal ultrasound was  -We will DC bicarb drip today.  Probable healthcare associated pneumonia -Chest x-ray on presentation showed possible new streaky opacity.  Patient recently was treated with antibiotics for pneumonia.  Last hospitalization -Currently on cefepime.  Procalcitonin 0.67 on presentation and improving to 0.28 today.   -Currently on room air  Acute metabolic encephalopathy Very poor oral intake Dehydration Delirium -Patient's oral intake has been very poor since recent hospital discharge with  increasing confusion at home.  As per the daughter, patient does not have known diagnosis of dementia -Unclear if the patient has underlying dementia as well.  Hopefully mental status will start improving with treatment for pneumonia and improving and renal function. -Monitor mental status.  Fall precautions. -SLP evaluation.  PT evaluation. -Patient received Haldol again this morning.  Patient was also started on Remeron recently by PCP with no improvement as per the daughter.  Remeron discontinued.  Continue scheduled Seroquel but will increase dose to 50 mg at night. -Palliative care following.  CODE STATUS has been changed to DNR by palliative care.  Overall prognosis is guarded to poor.   -Family is agreeable to pursue home hospice.  Consult TOC -Family is also agreeable to not pursue any more blood work.  COVID-19 positive -Originally tested positive for COVID-19 on April 14, 2021 and was tested positive again on 05/02/2021.  Patient will not need any more testing.   -Does not need any more isolation  Leukocytosis -Resolved   Hypomagnesemia -Replace.  Repeat a.m. labs  Hypertension -Blood pressure stable and on the lower side.  Continue amlodipine, atenolol, Imdur  Hyperlipidemia -Continue statin  BPH -Continue finasteride  Chronic diastolic heart failure -Recent echo showed EF of 55 to 60% with grade 1 diastolic dysfunction.  Cardiology had recently evaluated him during last hospitalization and recommended outpatient follow-up.  Continue beta-blocker, isosorbide mononitrate.  Lasix and chlorthalidone on hold.  Strict input and output.  Daily weights.    Asthma -Continue albuterol  Gout -Allopurinol on hold in the setting of AKI   DVT prophylaxis: Lovenox Code Status: Full Family Communication: Son and daughter at bedside on 05/14/2021 Disposition Plan: Status is: Inpatient  Remains inpatient  appropriate because:Inpatient level of care appropriate due to severity of  illness  Dispo: The patient is from: Home              Anticipated d/c is to: Home in 1 to 2 days once home hospice is arranged.              Patient currently is not medically stable to d/c.   Difficult to place patient No  Consultants: Nephrology/palliative care  Procedures: None  Antimicrobials: Cefepime from 05/11/2021 onwards   Subjective: Patient seen and examined at bedside.  Patient was agitated early this morning again and received Haldol as per nursing staff.  No overnight fever, vomiting, worsening shortness of breath reported.   Objective: Vitals:   05/13/21 2000 05/14/21 0000 05/14/21 0400 05/14/21 0759  BP: 137/67 118/72 112/68 122/74  Pulse: (!) 118 86 97 89  Resp: (!) 22 (!) 22 14 20   Temp: 98.8 F (37.1 C) 98.7 F (37.1 C) 98.8 F (37.1 C) 98 F (36.7 C)  TempSrc: Axillary Oral Axillary Axillary  SpO2: 90% 94% 92% 92%  Weight:   79 kg     Intake/Output Summary (Last 24 hours) at 05/14/2021 0806 Last data filed at 05/14/2021 0434 Gross per 24 hour  Intake 12 ml  Output 100 ml  Net -88 ml    Filed Weights   05/14/21 0400  Weight: 79 kg    Examination:  General exam: On room air currently.  No acute distress.  Looks chronically ill and deconditioned.   Respiratory system: Bilateral decreased breath sounds at bases with scattered crackles and intermittent tachypnea  cardiovascular system: S1-S2 heard, rate controlled gastrointestinal system: Abdomen is slightly distended, soft and nontender.  Normal bowel sounds heard Extremities: Bilateral lower extremity edema present; no clubbing  Central nervous system: Extremely slow to respond, wakes up slightly.  Poor historian.  No focal neurological deficits.  Moving extremities Skin: No obvious petechiae/lesions psychiatry: Could not be assessed because of mental status  Data Reviewed: I have personally reviewed following labs and imaging studies  CBC: Recent Labs  Lab 05/10/21 1628 05/11/21 1415  05/12/21 0848 05/13/21 0015 05/14/21 0038  WBC 9.9 12.8* 9.9 7.2 9.7  NEUTROABS 6.5 7.9*  --  4.8 6.9  HGB 14.1 14.3 13.5 11.8* 12.3*  HCT 43.4 44.3 42.7 36.2* 37.9*  MCV 92.6 93.3 94.7 92.8 92.2  PLT 336.0 369 247 219 625    Basic Metabolic Panel: Recent Labs  Lab 05/10/21 1628 05/11/21 1415 05/12/21 0848 05/13/21 0015 05/14/21 0038  NA 147* 144 146* 143 144  K 4.7 4.4 4.3 4.4 4.1  CL 113* 114* 120* 117* 112*  CO2 18* 16* 12* 16* 23  GLUCOSE 96 113* 96 92 110*  BUN 98* 106* 100* 96* 80*  CREATININE 3.00* 3.68* 3.57* 3.36* 2.78*  CALCIUM 9.6 9.9 9.2 8.8* 8.8*  MG  --   --  1.9 1.6* 1.5*    GFR: Estimated Creatinine Clearance: 19.1 mL/min (A) (by C-G formula based on SCr of 2.78 mg/dL (H)). Liver Function Tests: Recent Labs  Lab 05/10/21 1628 05/11/21 1415 05/12/21 0848 05/13/21 0015  AST 25 29 32 29  ALT 18 22 21 18   ALKPHOS 33* 31* 26* 23*  BILITOT 0.7 0.6 0.9 1.0  PROT 6.2 6.6 5.9* 5.2*  ALBUMIN 3.2* 2.7* 2.4* 2.0*    No results for input(s): LIPASE, AMYLASE in the last 168 hours. Recent Labs  Lab 05/13/21 0015  AMMONIA 37*    Coagulation Profile:  No results for input(s): INR, PROTIME in the last 168 hours. Cardiac Enzymes: No results for input(s): CKTOTAL, CKMB, CKMBINDEX, TROPONINI in the last 168 hours. BNP (last 3 results) No results for input(s): PROBNP in the last 8760 hours. HbA1C: No results for input(s): HGBA1C in the last 72 hours. CBG: No results for input(s): GLUCAP in the last 168 hours. Lipid Profile: No results for input(s): CHOL, HDL, LDLCALC, TRIG, CHOLHDL, LDLDIRECT in the last 72 hours. Thyroid Function Tests: Recent Labs    05/13/21 0015  TSH 2.984    Anemia Panel: Recent Labs    05/13/21 0015  VITAMINB12 728    Sepsis Labs: Recent Labs  Lab 2021/05/25 0848 05/13/21 0015  PROCALCITON 0.25 0.28     No results found for this or any previous visit (from the past 240 hour(s)).       Radiology Studies: US  RENAL  Result Date: May 25, 2021 CLINICAL DATA:  Acute kidney injury EXAM: RENAL / URINARY TRACT ULTRASOUND COMPLETE COMPARISON:  None. FINDINGS: Right Kidney: Renal measurements: 7.7 x 4.7 x 5.6 cm = volume: 106 mL. Echogenicity within normal limits. Multiple cysts (less than 10), largest measuring 4.4 cm. No suspicious mass or hydronephrosis. Left Kidney: Renal measurements: 10.3 x 6.5 x 5.4 cm = volume: 187 mL. Echogenicity within normal limits. Multiple cysts (less than 10), largest measuring 1.8 cm. No suspicious mass or hydronephrosis. Bladder: Appears normal for degree of bladder distention. Other: None. IMPRESSION: 1. Bilateral renal cysts. 2. No acute findings.  No hydronephrosis. Electronically Signed   By: Franki Cabot M.D.   On: 2021-05-25 12:04        Scheduled Meds:  aspirin  81 mg Oral Daily   enoxaparin (LOVENOX) injection  30 mg Subcutaneous Q24H   finasteride  5 mg Oral Daily   folic acid  1 mg Oral Daily   multivitamin  1 tablet Oral Daily   QUEtiapine  25 mg Oral QHS   simvastatin  10 mg Oral QHS   sodium chloride flush  3 mL Intravenous Q12H   Continuous Infusions:  ceFEPime (MAXIPIME) IV 2 g (05/13/21 1721)   sodium bicarbonate 150 mEq in D5W infusion 50 mL/hr at 05/14/21 5638          Aline August, MD [facility] 05/14/2021, 8:06 AM

## 2021-05-15 ENCOUNTER — Telehealth: Payer: Self-pay

## 2021-05-15 DIAGNOSIS — I5032 Chronic diastolic (congestive) heart failure: Secondary | ICD-10-CM | POA: Diagnosis not present

## 2021-05-15 DIAGNOSIS — Z743 Need for continuous supervision: Secondary | ICD-10-CM | POA: Diagnosis not present

## 2021-05-15 DIAGNOSIS — J189 Pneumonia, unspecified organism: Secondary | ICD-10-CM | POA: Diagnosis not present

## 2021-05-15 DIAGNOSIS — R29898 Other symptoms and signs involving the musculoskeletal system: Secondary | ICD-10-CM | POA: Diagnosis not present

## 2021-05-15 DIAGNOSIS — R4182 Altered mental status, unspecified: Secondary | ICD-10-CM | POA: Diagnosis not present

## 2021-05-15 DIAGNOSIS — I10 Essential (primary) hypertension: Secondary | ICD-10-CM | POA: Diagnosis not present

## 2021-05-15 DIAGNOSIS — N179 Acute kidney failure, unspecified: Secondary | ICD-10-CM | POA: Diagnosis not present

## 2021-05-15 MED ORDER — LORAZEPAM 0.5 MG PO TABS: 0.5000 mg | ORAL_TABLET | Freq: Three times a day (TID) | ORAL | 0 refills | Status: AC | PRN

## 2021-05-15 MED ORDER — MORPHINE SULFATE 10 MG/5ML PO SOLN: 5.0000 mg | ORAL | 0 refills | Status: AC | PRN

## 2021-05-15 MED ORDER — BENZONATATE 100 MG PO CAPS: 100.0000 mg | ORAL_CAPSULE | Freq: Three times a day (TID) | ORAL | 0 refills | Status: AC | PRN

## 2021-05-15 MED ORDER — QUETIAPINE FUMARATE 50 MG PO TABS: 50.0000 mg | ORAL_TABLET | Freq: Every day | ORAL | 0 refills | Status: AC

## 2021-05-15 NOTE — Discharge Summary (Signed)
Physician Discharge Summary  Shane [name] NLZ:[id number] DOB: Aug 13, 1932 DOA: 05/11/2021  PCP: Dorothyann Peng, NP  Admit date: 05/11/2021 Discharge date: 05/15/2021  Admitted From: Home Disposition: Home with hospice  Recommendations for Outpatient Follow-up:  Follow up with home hospice at earliest Basile: Home with hospice Equipment/Devices: None  Discharge Condition: Poor  CODE STATUS: DNR Diet recommendation: Diet as per SLP recommendations/comfort measures food  Brief/Interim Summary: 87 y.o. male with medical history significant of macular degeneration, iron deficiency anemia, CKD IV, hypertension, C. difficile, asthma, GERD, hyperlipidemia, MGUS, spinal stenosis, squamous cell skin cancer, gout, diastolic heart failure, BPH, recent hospitalization from 04/30/2021-05/03/2021 with acute hypoxic respiratory failure secondary to pneumonia/acute on chronic diastolic heart failure and acute kidney injury treated with antibiotics and Lasix presented with generalized weakness, very poor oral intake, fever, cough and nausea and vomiting.  On presentation, bicarb was 16, creatinine of 3.68 (baseline of 2-2.5), WBC of 12.8.  Chest x-ray showed stable patchy opacities left greater than right at the base similar to previous admission but new streaky opacity at the right suprahilar region with stable left small pleural effusion.  He was started on IV fluids and antibiotics.  Nephrology was consulted.  Subsequently palliative care team was also consulted because of overall poor prognosis.  After discussion with palliative care team, family has decided to pursue home hospice.  He will be discharged to home hospice once bed is available.  All nonessential medications will be discontinued.  Discharge Diagnoses:   Comfort measures only status Acute kidney injury on chronic kidney disease stage IV Acute metabolic acidosis Possible healthcare associated pneumonia Acute metabolic  encephalopathy Very poor oral intake Dehydration Delirium History of recent COVID-19 positive Leukocytosis: Resolved Hypomagnesemia Hypertension BPH Chronic diastolic heart failure Asthma Gout Hyperlipidemia  Plan -  After discussion with palliative care team, family has decided to pursue home hospice.  He will be discharged to home hospice once bed is available.  All nonessential medications will be discontinued.  Discharge Instructions  Discharge Instructions     Diet - low sodium heart healthy   Complete by: As directed    DYS 1 Room service appropriate? Yes with Assist; Fluid consistency: Honey Thick   Increase activity slowly   Complete by: As directed       Allergies as of 05/15/2021   No Known Allergies      Medication List     STOP taking these medications    allopurinol 300 MG tablet Commonly known as: ZYLOPRIM   amLODipine 5 MG tablet Commonly known as: NORVASC   aspirin 81 MG chewable tablet   atenolol 25 MG tablet Commonly known as: TENORMIN   chlorthalidone 25 MG tablet Commonly known as: HYGROTON   furosemide 40 MG tablet Commonly known as: Lasix   isosorbide mononitrate 30 MG 24 hr tablet Commonly known as: IMDUR   mirtazapine 30 MG tablet Commonly known as: Remeron   potassium chloride 10 MEQ tablet Commonly known as: KLOR-CON   PreserVision AREDS 2 Caps   simvastatin 10 MG tablet Commonly known as: ZOCOR       TAKE these medications    acetaminophen 500 MG tablet Commonly known as: TYLENOL Take 500-1,000 mg by mouth every 6 (six) hours as needed (for pain).   albuterol 108 (90 Base) MCG/ACT inhaler Commonly known as: VENTOLIN HFA Inhale 2 puffs into the lungs every 6 (six) hours as needed for wheezing or shortness of breath.   benzonatate 100 MG capsule Commonly  known as: Best boy Take 1 capsule (100 mg total) by mouth 3 (three) times daily as needed for cough.   budesonide 3 MG 24 hr capsule Commonly known  as: ENTOCORT EC Take 3 capsules (9 mg total) by mouth daily.   finasteride 5 MG tablet Commonly known as: Proscar Take 1 tablet (5 mg total) by mouth daily.   LORazepam 0.5 MG tablet Commonly known as: Ativan Take 1 tablet (0.5 mg total) by mouth every 8 (eight) hours as needed for anxiety.   morphine 10 MG/5ML solution Take 2.5 mLs (5 mg total) by mouth every 2 (two) hours as needed for severe pain.   QUEtiapine 50 MG tablet Commonly known as: SEROQUEL Take 1 tablet (50 mg total) by mouth at bedtime.        Follow-up Information     Dorothyann Peng, NP. Schedule an appointment as soon as possible for a visit in 1 week(s).   Specialty: Family Medicine Contact information: [address] [place] [address] [phone]         home hospice Follow up.   Why: At earliest convenience               No Known Allergies  Consultations: Nephrology/palliative care   Procedures/Studies: DG Chest 2 View  Result Date: 05/11/2021 CLINICAL DATA:  Follow-up pneumonia. Cough and congestion. Shortness of breath. EXAM: CHEST - 2 VIEW COMPARISON:  Most recent radiograph 05/02/2021.  CT 04/30/2021 FINDINGS: Slight improvement in patchy left greater than right basilar opacities, mild to moderate residual. There is a new streaky opacity in the right suprahilar lung. Suspect trace left pleural effusion. Stable heart size and mediastinal contours with aortic atherosclerosis. No pulmonary edema or pneumothorax. No acute osseous abnormalities are seen. IMPRESSION: 1. New streaky right suprahilar opacity, atelectasis versus pneumonia in the setting of COVID. 2. Slight improvement in left greater than right basilar opacities, mild to moderate residual. Suspect trace left pleural effusion. Electronically Signed   By: Keith Rake M.D.   On: 05/11/2021 15:15   DG Chest 2 View  Result Date: 05/11/2021 CLINICAL DATA:  Shortness of breath.  COVID. EXAM: CHEST - 2 VIEW COMPARISON:   Radiograph yesterday. Additional priors reviewed. Chest CT 04/30/2021 FINDINGS: Persistent patchy opacity at the left greater than right lung base. Minimal vague streaky opacity in the right suprahilar lung. Stable heart size and mediastinal contours. Aortic atherosclerosis. There is a small left pleural effusion. No pulmonary edema. No pneumothorax. Thoracic spondylosis. Widening of the right acromioclavicular joint may be postsurgical. IMPRESSION: Unchanged patchy opacity at the left greater than right lung base. Minimal vague streaky opacity in the right suprahilar lung. Small left pleural effusion. Electronically Signed   By: Keith Rake M.D.   On: 05/11/2021 15:14   DG Chest 2 View  Result Date: 04/30/2021 CLINICAL DATA:  Shortness of breath, COVID EXAM: CHEST - 2 VIEW COMPARISON:  04/27/2021 FINDINGS: Left basilar airspace opacity with small left effusion again noted, not significantly changed. No confluent opacity on the right. Heart is borderline in size. Aortic atherosclerosis. No acute bony abnormality. IMPRESSION: Continued left basilar opacity concerning for pneumonia. Small left effusion. No real change. Electronically Signed   By: Rolm Baptise M.D.   On: 04/30/2021 11:20   DG Chest 2 View  Result Date: 04/27/2021 CLINICAL DATA:  Cough EXAM: CHEST - 2 VIEW COMPARISON:  Radiograph 10/07/2017 FINDINGS: Unchanged, enlarged cardiac silhouette. There are left basilar opacities and a small left pleural effusion. No visible pneumothorax.  No acute osseous abnormality. Thoracic spondylosis. IMPRESSION: Left basilar pneumonia and small left pleural effusion. Electronically Signed   By: Maurine Simmering   On: 04/27/2021 10:52   CT Angio Chest PE W/Cm &/Or Wo Cm  Result Date: 04/30/2021 CLINICAL DATA:  Shortness of breath, COVID-19 positive. PE suspected EXAM: CT ANGIOGRAPHY CHEST WITH CONTRAST TECHNIQUE: Multidetector CT imaging of the chest was performed using the standard protocol during bolus  administration of intravenous contrast. Multiplanar CT image reconstructions and MIPs were obtained to evaluate the vascular anatomy. CONTRAST:  62mL OMNIPAQUE IOHEXOL 350 MG/ML SOLN COMPARISON:  Same day chest x-ray FINDINGS: Cardiovascular: Satisfactory opacification of the pulmonary arteries to the segmental level. No evidence of pulmonary embolism. Thoracic aorta is nonaneurysmal. Atherosclerotic calcifications of the aorta and coronary arteries. Heart size is borderline enlarged. No pericardial effusion. Mediastinum/Nodes: No enlarged mediastinal, hilar, or axillary lymph nodes. Thyroid gland, trachea, and esophagus demonstrate no significant findings. Lungs/Pleura: Patchy airspace consolidation within the basilar segment of the left lower lobe. Subsegmental atelectasis within the dependent right lower lobe. Minimal ground-glass opacity within the bilateral upper lobes. No pleural effusion or pneumothorax. Upper Abdomen: Small hiatal hernia. Musculoskeletal: No chest wall abnormality. No acute or significant osseous findings. Multilevel bridging endplate osteophytes anteriorly within the thoracic spine suggesting diffuse idiopathic skeletal hyperostosis. Review of the MIP images confirms the above findings. IMPRESSION: 1. No evidence of pulmonary embolism. 2. Patchy airspace consolidation within the basilar segment of the left lower lobe, suspicious for pneumonia. 3. Minimal ground-glass opacity within the bilateral upper lobes, which may represent additional sites of atypical/viral infection. 4. Aortic atherosclerosis (ICD10-I70.0). 5. Skeletal findings suggestive of DISH. Electronically Signed   By: Davina Poke D.O.   On: 04/30/2021 15:53   US RENAL  Result Date: 05/12/2021 CLINICAL DATA:  Acute kidney injury EXAM: RENAL / URINARY TRACT ULTRASOUND COMPLETE COMPARISON:  None. FINDINGS: Right Kidney: Renal measurements: 7.7 x 4.7 x 5.6 cm = volume: 106 mL. Echogenicity within normal limits. Multiple  cysts (less than 10), largest measuring 4.4 cm. No suspicious mass or hydronephrosis. Left Kidney: Renal measurements: 10.3 x 6.5 x 5.4 cm = volume: 187 mL. Echogenicity within normal limits. Multiple cysts (less than 10), largest measuring 1.8 cm. No suspicious mass or hydronephrosis. Bladder: Appears normal for degree of bladder distention. Other: None. IMPRESSION: 1. Bilateral renal cysts. 2. No acute findings.  No hydronephrosis. Electronically Signed   By: Franki Cabot M.D.   On: 05/12/2021 12:04   DG Chest Port 1 View  Result Date: 05/02/2021 CLINICAL DATA:  Shortness of breath, COVID-19 infection. EXAM: PORTABLE CHEST 1 VIEW COMPARISON:  05/01/2021 and CT chest 04/30/2021. FINDINGS: Trachea is midline. Heart size stable. Thoracic aorta is calcified. Patchy bilateral airspace opacification, worst in the left lower lobe. Overall aeration has improved slightly from yesterday's exam. There may be a small left pleural effusion. Resection of the distal right clavicle. IMPRESSION: 1. Patchy bilateral airspace opacification, worst in the left lower lobe but overall improved from 05/01/2021, supporting improving COVID-19 pneumonia. 2. Small left pleural effusion. Electronically Signed   By: Lorin Picket M.D.   On: 05/02/2021 08:44   DG Chest Port 1 View  Result Date: 05/01/2021 CLINICAL DATA:  Shortness of breath.  COVID. EXAM: PORTABLE CHEST 1 VIEW COMPARISON:  CT chest 04/30/2021.  Chest x-ray 04/30/2021 FINDINGS: Mediastinum is stable. Cardiomegaly. Progressive bibasilar pulmonary infiltrates/edema. Small bilateral pleural effusions. No pneumothorax. Degenerative change thoracic spine. IMPRESSION: 1.  Cardiomegaly. 2. Progressive bibasilar pulmonary infiltrates/edema. Small bilateral  pleural effusions. Electronically Signed   By: Marcello Moores  Register   On: 05/01/2021 08:31   ECHOCARDIOGRAM COMPLETE  Result Date: 05/01/2021    ECHOCARDIOGRAM REPORT   Patient Name:   JALIEN WEAKLAND Date of Exam: 05/01/2021  Medical Rec #:  [id number]      Height:       69.0 in Accession #:    [id number]     Weight:       177.0 lb Date of Birth:  May 25, 1932      BSA:          1.961 m Patient Age:    15 years       BP:           118/65 mmHg Patient Gender: M              HR:           70 bpm. Exam Location:  Inpatient Procedure: 2D Echo, Cardiac Doppler and Color Doppler Indications:    I50.33 Acute on chronic diastolic (congestive) heart failure  History:        Patient has no prior history of Echocardiogram examinations.                 Risk Factors:Hypertension. Cancer. GERD.  Sonographer:    Jonelle Sidle Dance Referring Phys: [id number] Carlos  1. Left ventricular ejection fraction, by estimation, is 55 to 60%. The left ventricle has normal function. The left ventricle has no regional wall motion abnormalities. Left ventricular diastolic parameters are consistent with Grade I diastolic dysfunction (impaired relaxation).  2. Right ventricular systolic function is normal. The right ventricular size is normal. There is mildly elevated pulmonary artery systolic pressure.  3. The mitral valve is normal in structure. No evidence of mitral valve regurgitation. No evidence of mitral stenosis.  4. Tricuspid valve regurgitation is mild to moderate.  5. The aortic valve is normal in structure. Aortic valve regurgitation is mild. No aortic stenosis is present.  6. The inferior vena cava is normal in size with greater than 50% respiratory variability, suggesting right atrial pressure of 3 mmHg. FINDINGS  Left Ventricle: Left ventricular ejection fraction, by estimation, is 55 to 60%. The left ventricle has normal function. The left ventricle has no regional wall motion abnormalities. The left ventricular internal cavity size was normal in size. There is  no left ventricular hypertrophy. Left ventricular diastolic parameters are consistent with Grade I diastolic dysfunction (impaired relaxation). Normal left ventricular filling pressure.  Right Ventricle: The right ventricular size is normal. No increase in right ventricular wall thickness. Right ventricular systolic function is normal. There is mildly elevated pulmonary artery systolic pressure. The tricuspid regurgitant velocity is 2.89  m/s, and with an assumed right atrial pressure of 8 mmHg, the estimated right ventricular systolic pressure is 32.2 mmHg. Left Atrium: Left atrial size was normal in size. Right Atrium: Right atrial size was normal in size. Pericardium: There is no evidence of pericardial effusion. Mitral Valve: The mitral valve is normal in structure. No evidence of mitral valve regurgitation. No evidence of mitral valve stenosis. Tricuspid Valve: The tricuspid valve is normal in structure. Tricuspid valve regurgitation is mild to moderate. No evidence of tricuspid stenosis. Aortic Valve: The aortic valve is normal in structure. Aortic valve regurgitation is mild. Aortic regurgitation PHT measures 538 msec. No aortic stenosis is present. Pulmonic Valve: The pulmonic valve was normal in structure. Pulmonic valve regurgitation is not visualized. No evidence of pulmonic stenosis. Aorta:  The aortic root is normal in size and structure. Venous: The inferior vena cava is normal in size with greater than 50% respiratory variability, suggesting right atrial pressure of 3 mmHg. IAS/Shunts: No atrial level shunt detected by color flow Doppler.  LEFT VENTRICLE PLAX 2D LVIDd:         4.50 cm     Diastology LVIDs:         3.20 cm     LV e' medial:    5.98 cm/s LV PW:         1.30 cm     LV E/e' medial:  11.2 LV IVS:        1.50 cm     LV e' lateral:   9.46 cm/s LVOT diam:     2.40 cm     LV E/e' lateral: 7.1 LV SV:         84 LV SV Index:   43 LVOT Area:     4.52 cm  LV Volumes (MOD) LV vol d, MOD A2C: 59.8 ml LV vol d, MOD A4C: 84.4 ml LV vol s, MOD A2C: 28.8 ml LV vol s, MOD A4C: 37.8 ml LV SV MOD A2C:     31.0 ml LV SV MOD A4C:     84.4 ml LV SV MOD BP:      37.0 ml RIGHT VENTRICLE RV  Basal diam:  2.90 cm RV S prime:     11.90 cm/s TAPSE (M-mode): 1.6 cm LEFT ATRIUM             Index       RIGHT ATRIUM           Index LA diam:        3.70 cm 1.89 cm/m  RA Area:     14.80 cm LA Vol (A2C):   48.3 ml 24.62 ml/m RA Volume:   34.10 ml  17.38 ml/m LA Vol (A4C):   27.0 ml 13.77 ml/m LA Biplane Vol: 37.7 ml 19.22 ml/m  AORTIC VALVE LVOT Vmax:   103.00 cm/s LVOT Vmean:  63.950 cm/s LVOT VTI:    0.185 m AI PHT:      538 msec  AORTA Ao Root diam: 3.60 cm Ao Asc diam:  3.50 cm MITRAL VALVE               TRICUSPID VALVE MV Area (PHT): 2.66 cm    TR Peak grad:   33.4 mmHg MV Decel Time: 285 msec    TR Vmax:        289.00 cm/s MV E velocity: 66.70 cm/s MV A velocity: 79.60 cm/s  SHUNTS MV E/A ratio:  0.84        Systemic VTI:  0.18 m                            Systemic Diam: 2.40 cm Dani Gobble Croitoru MD Electronically signed by Sanda Klein MD Signature Date/Time: 05/01/2021/4:49:09 PM    Final       Subjective: Patient seen and examined at bedside.  Sleepy, wakes up slightly, hardly participates in any conversation.  Daughter present at bedside who thinks that patient is comfortable.  Discharge Exam: Vitals:   05/14/21 1946 05/15/21 0740  BP: (!) 138/102 107/75  Pulse: (!) 105 (!) 103  Resp: 16 16  Temp: 99 F (37.2 C) 98.6 F (37 C)  SpO2: 92%     General: Pt is sleepy, wakes up slightly, hardly  participates in any conversation.  No distress currently.   Cardiovascular: Intermittently tachycardic, S1/S2 + Respiratory: bilateral decreased breath sounds at bases with scattered crackles Abdominal: Soft, NT, ND, bowel sounds + Extremities: Trace lower extremity edema; no cyanosis    The results of significant diagnostics from this hospitalization (including imaging, microbiology, ancillary and laboratory) are listed below for reference.     Microbiology: No results found for this or any previous visit (from the past 240 hour(s)).   Labs: BNP (last 3 results) Recent Labs     05/01/21 0742 05/02/21 0116 05/03/21 0230  BNP 59.8 68.8 347.4*   Basic Metabolic Panel: Recent Labs  Lab 05/10/21 1628 05/11/21 1415 05/12/21 0848 05/13/21 0015 05/14/21 0038  NA 147* 144 146* 143 144  K 4.7 4.4 4.3 4.4 4.1  CL 113* 114* 120* 117* 112*  CO2 18* 16* 12* 16* 23  GLUCOSE 96 113* 96 92 110*  BUN 98* 106* 100* 96* 80*  CREATININE 3.00* 3.68* 3.57* 3.36* 2.78*  CALCIUM 9.6 9.9 9.2 8.8* 8.8*  MG  --   --  1.9 1.6* 1.5*   Liver Function Tests: Recent Labs  Lab 05/10/21 1628 05/11/21 1415 05/12/21 0848 05/13/21 0015  AST 25 29 32 29  ALT 18 22 21 18   ALKPHOS 33* 31* 26* 23*  BILITOT 0.7 0.6 0.9 1.0  PROT 6.2 6.6 5.9* 5.2*  ALBUMIN 3.2* 2.7* 2.4* 2.0*   No results for input(s): LIPASE, AMYLASE in the last 168 hours. Recent Labs  Lab 05/13/21 0015  AMMONIA 37*   CBC: Recent Labs  Lab 05/10/21 1628 05/11/21 1415 05/12/21 0848 05/13/21 0015 05/14/21 0038  WBC 9.9 12.8* 9.9 7.2 9.7  NEUTROABS 6.5 7.9*  --  4.8 6.9  HGB 14.1 14.3 13.5 11.8* 12.3*  HCT 43.4 44.3 42.7 36.2* 37.9*  MCV 92.6 93.3 94.7 92.8 92.2  PLT 336.0 369 247 219 194   Cardiac Enzymes: No results for input(s): CKTOTAL, CKMB, CKMBINDEX, TROPONINI in the last 168 hours. BNP: Invalid input(s): POCBNP CBG: No results for input(s): GLUCAP in the last 168 hours. D-Dimer No results for input(s): DDIMER in the last 72 hours. Hgb A1c No results for input(s): HGBA1C in the last 72 hours. Lipid Profile No results for input(s): CHOL, HDL, LDLCALC, TRIG, CHOLHDL, LDLDIRECT in the last 72 hours. Thyroid function studies Recent Labs    05/13/21 0015  TSH 2.984   Anemia work up Recent Labs    05/13/21 0015  VITAMINB12 728   Urinalysis    Component Value Date/Time   COLORURINE YELLOW 05/12/2021 0038   APPEARANCEUR HAZY (A) 05/12/2021 0038   LABSPEC 1.015 05/12/2021 0038   PHURINE 5.0 05/12/2021 0038   GLUCOSEU NEGATIVE 05/12/2021 0038   HGBUR NEGATIVE 05/12/2021 0038    HGBUR negative 08/17/2010 0909   BILIRUBINUR NEGATIVE 05/12/2021 0038   BILIRUBINUR neg 05/11/2021 1008   KETONESUR NEGATIVE 05/12/2021 0038   PROTEINUR 30 (A) 05/12/2021 0038   UROBILINOGEN 0.2 05/11/2021 1008   UROBILINOGEN 0.2 08/17/2010 0909   NITRITE NEGATIVE 05/12/2021 0038   LEUKOCYTESUR NEGATIVE 05/12/2021 0038   Sepsis Labs Invalid input(s): PROCALCITONIN,  WBC,  LACTICIDVEN Microbiology No results found for this or any previous visit (from the past 240 hour(s)).   Time coordinating discharge: 35 minutes  SIGNED:   Aline August, MD  [facility] 05/15/2021, 9:24 AM

## 2021-05-15 NOTE — Progress Notes (Signed)
PT Cancellation Note  Patient Details Name: Shane [name] MRN: [id number] DOB: 11-Jul-1932   Cancelled Treatment:    Reason Eval/Treat Not Completed: Other (comment) pt currently being transferred to stretcher by [place] staff for DC home. Thank you for the opportunity to participate in his care!   Windell Norfolk, DPT, PN2   Supplemental Physical Therapist [place]    Pager [phone] Acute Rehab Office [phone]

## 2021-05-15 NOTE — Consult Note (Signed)
   [facility] Inpatient Consult   05/15/2021  [name] [birth year] [id number]  [facility] [ACO] Patient: Shane [name] Medicare  Primary Care Provider:  Dorothyann Peng, NP, an Embedded provider at [facility]  Extreme high risk for unplanned readmission risk review and readmission less than 30 days  Chart reviewed for post hospital follow up needs and reveals the patient is currently transitioning to Hospice/Palliative Care.   Patient will be followed by [facility] for home with hospice care  Given this choice patient will have full case management services through Hospice and needs will be met at the hospice level of care.  Plan:  Sign off at transition  Natividad Brood, RN BSN [facility] Liaison  [phone] business mobile phone Toll free office [phone]  Fax number: [phone] [email or web address]

## 2021-05-15 NOTE — TOC Transition Note (Signed)
Transition of Care [facility]) - CM/SW Discharge Note   Patient Details  Name: Shane [name] MRN: [id number] Date of Birth: July 29, 1932  [facility] CM/SW Contact:  Benard Halsted, LCSW Phone Number: 05/15/2021, 10:04 AM   Clinical Narrative:    CSW spoke with patient's daughter. She is ready for transport to be called. CSW contacted [facility], and she has set up a RN visit for tomorrow morning due to staffing. She will contact patient's son. CSW contacted [facility] and arranged transport home; forms on the chart.    Final next level of care: Home w Hospice Care Barriers to Discharge: Barriers Resolved   Patient Goals and CMS Choice Patient states their goals for this hospitalization and ongoing recovery are:: comfort at home CMS Medicare.gov Compare Post Acute Care list provided to:: Patient Represenative (must comment) Choice offered to / list presented to : Adult Children  Discharge Placement                       Discharge Plan and Services In-house Referral: Clinical Social Work   Post Acute Care Choice: Hospice                      HH Agency: Hospice and [place] Date [place]: 05/14/21   Representative spoke with at [place]: [place] ([place]) Interventions     Readmission Risk Interventions Readmission Risk Prevention Plan 05/14/2021  Transportation Screening Complete  Medication Review Press photographer) Complete  PCP or Specialist appointment within 3-5 days of discharge Complete  HRI or [place] Complete  SW Recovery Care/Counseling Consult Complete  Palliative Care Screening Complete  [place] Not Applicable  Some recent data might be hidden

## 2021-05-15 NOTE — Care Management Important Message (Signed)
Important Message  Patient Details  Name: Shane [name] MRN: [id number] Date of Birth: [birth year]   Medicare Important Message Given:  Yes Patient left prior to IM delivery will mail copy to the patient home address.     Shin Lamour 05/15/2021, 3:25 PM

## 2021-05-15 NOTE — Telephone Encounter (Signed)
Erroneous encounter

## 2021-05-15 NOTE — Telephone Encounter (Signed)
Verbal orders given to Shane [name] . 

## 2021-05-15 NOTE — Progress Notes (Signed)
[facility]) hospital liaison note:  Pt and chart reviewed by [facility] MD.  Hospice eligibility confirmed.  Spoke with bedside RN and with pt's son, Lanny Hurst.  Discussed use of condom caths in the home.  Lanny Hurst verbalizes understanding.  Encouraged family to ask for demonstration at bedside of how to empty urine bag and how to connect condom cath to tubing.  Lanny Hurst verbalized understanding.  Admission visit set for Wednesday 10am.  Per discussion pt will discharge home tomorrow via [facility].  Please send signed DNR with pt.  Please send medications at discharge to ensure comfort until pt can be admitted onto hospice.  DME delivered and ready to go, per Lanny Hurst.  Thank you for the opportunity to participate in this pt's care.  Domenic Moras, BSN, RN [facility] hospital liaison [phone] (24h on call)

## 2021-05-15 NOTE — Telephone Encounter (Signed)
Shane [name] from [place] called to inform Pt has been released from hospital and family request PCP to continue hospice care. Call back # [phone] you can leave voicemail.

## 2021-05-15 NOTE — Telephone Encounter (Signed)
Okay for verbal orders? Please advise 

## 2021-05-17 ENCOUNTER — Telehealth: Payer: Self-pay | Admitting: Pharmacist

## 2021-05-17 NOTE — Progress Notes (Signed)
    Chronic Care Management Pharmacy Assistant   Name: Shane [name]  MRN: [id number] DOB: Sep 18, 1932  05-17-21 Call to [facility] pt's daughter per staff msg from Jeni Salles to check on medication delivery coordination after hospital admission and discharge recently. Per notes patient will be starting hospice soon, call to [facility] she confirmed he is on hospice currently but they did get his medications coordinated, all but 4 of them where discontinued. They are keeping him comfortable. Clinical Pharmacist Advised.  [facility] Clinical Pharmacist Assistant [phone]

## 2021-05-23 ENCOUNTER — Ambulatory Visit: Payer: Medicare HMO | Admitting: Cardiology

## 2021-05-23 ENCOUNTER — Telehealth: Payer: Self-pay | Admitting: Adult Health

## 2021-05-23 NOTE — Telephone Encounter (Signed)
Left voice mail on patients wifes phone expressing condolences

## 2021-05-31 ENCOUNTER — Other Ambulatory Visit: Payer: Medicare HMO

## 2021-05-31 DEATH — deceased

## 2021-06-06 ENCOUNTER — Ambulatory Visit: Payer: Medicare HMO | Admitting: Internal Medicine

## 2021-06-06 ENCOUNTER — Other Ambulatory Visit: Payer: Self-pay | Admitting: Adult Health

## 2021-06-06 DIAGNOSIS — R630 Anorexia: Secondary | ICD-10-CM

## 2021-06-06 DIAGNOSIS — G479 Sleep disorder, unspecified: Secondary | ICD-10-CM

## 2021-06-07 ENCOUNTER — Telehealth: Payer: Self-pay

## 2021-06-25 ENCOUNTER — Encounter (INDEPENDENT_AMBULATORY_CARE_PROVIDER_SITE_OTHER): Payer: Medicare HMO | Admitting: Ophthalmology
